# Patient Record
Sex: Female | Born: 1965
Health system: Southern US, Community
[De-identification: ages and names within clinical notes are randomized; demographics above are authoritative.]

## PROBLEM LIST (undated history)

## (undated) DIAGNOSIS — K219 Gastro-esophageal reflux disease without esophagitis: Secondary | ICD-10-CM

## (undated) DIAGNOSIS — Z8489 Family history of other specified conditions: Secondary | ICD-10-CM

## (undated) DIAGNOSIS — E1169 Type 2 diabetes mellitus with other specified complication: Secondary | ICD-10-CM

## (undated) DIAGNOSIS — E785 Hyperlipidemia, unspecified: Secondary | ICD-10-CM

## (undated) DIAGNOSIS — I10 Essential (primary) hypertension: Secondary | ICD-10-CM

## (undated) DIAGNOSIS — R7303 Prediabetes: Secondary | ICD-10-CM

## (undated) DIAGNOSIS — E669 Obesity, unspecified: Secondary | ICD-10-CM

## (undated) DIAGNOSIS — D126 Benign neoplasm of colon, unspecified: Secondary | ICD-10-CM

## (undated) DIAGNOSIS — E7800 Pure hypercholesterolemia, unspecified: Secondary | ICD-10-CM

## (undated) DIAGNOSIS — Z973 Presence of spectacles and contact lenses: Secondary | ICD-10-CM

## (undated) HISTORY — DX: Type 2 diabetes mellitus with other specified complication: E66.9

## (undated) HISTORY — DX: Obesity, unspecified: E11.69

## (undated) HISTORY — DX: Benign neoplasm of colon, unspecified: D12.6

## (undated) HISTORY — DX: Essential (primary) hypertension: I10

## (undated) HISTORY — DX: Prediabetes: R73.03

## (undated) HISTORY — PX: CHOLECYSTECTOMY: SHX55

## (undated) HISTORY — DX: Hyperlipidemia, unspecified: E78.5

---

## 1998-02-12 ENCOUNTER — Ambulatory Visit (HOSPITAL_BASED_OUTPATIENT_CLINIC_OR_DEPARTMENT_OTHER): Admission: RE | Admit: 1998-02-12 | Discharge: 1998-02-12 | Payer: Self-pay | Admitting: Surgery

## 1999-03-25 ENCOUNTER — Other Ambulatory Visit: Admission: RE | Admit: 1999-03-25 | Discharge: 1999-03-25 | Payer: Self-pay | Admitting: Obstetrics and Gynecology

## 1999-09-07 ENCOUNTER — Emergency Department (HOSPITAL_COMMUNITY): Admission: EM | Admit: 1999-09-07 | Discharge: 1999-09-07 | Payer: Self-pay | Admitting: Emergency Medicine

## 1999-09-07 ENCOUNTER — Encounter: Payer: Self-pay | Admitting: Emergency Medicine

## 1999-11-15 ENCOUNTER — Encounter: Payer: Self-pay | Admitting: Obstetrics and Gynecology

## 1999-11-15 ENCOUNTER — Ambulatory Visit (HOSPITAL_COMMUNITY): Admission: RE | Admit: 1999-11-15 | Discharge: 1999-11-15 | Payer: Self-pay | Admitting: Obstetrics and Gynecology

## 2000-06-04 ENCOUNTER — Encounter: Payer: Self-pay | Admitting: *Deleted

## 2000-06-04 ENCOUNTER — Emergency Department (HOSPITAL_COMMUNITY): Admission: EM | Admit: 2000-06-04 | Discharge: 2000-06-04 | Payer: Self-pay | Admitting: Emergency Medicine

## 2000-06-04 ENCOUNTER — Encounter: Payer: Self-pay | Admitting: Gastroenterology

## 2001-04-16 ENCOUNTER — Other Ambulatory Visit: Admission: RE | Admit: 2001-04-16 | Discharge: 2001-04-16 | Payer: Self-pay | Admitting: Obstetrics and Gynecology

## 2002-03-15 ENCOUNTER — Ambulatory Visit (HOSPITAL_BASED_OUTPATIENT_CLINIC_OR_DEPARTMENT_OTHER): Admission: RE | Admit: 2002-03-15 | Discharge: 2002-03-15 | Payer: Self-pay | Admitting: Surgery

## 2002-03-15 ENCOUNTER — Encounter (INDEPENDENT_AMBULATORY_CARE_PROVIDER_SITE_OTHER): Payer: Self-pay | Admitting: *Deleted

## 2002-08-07 ENCOUNTER — Other Ambulatory Visit: Admission: RE | Admit: 2002-08-07 | Discharge: 2002-08-07 | Payer: Self-pay | Admitting: Obstetrics and Gynecology

## 2003-04-16 ENCOUNTER — Encounter: Admission: RE | Admit: 2003-04-16 | Discharge: 2003-04-16 | Payer: Self-pay | Admitting: Internal Medicine

## 2003-04-16 ENCOUNTER — Encounter: Payer: Self-pay | Admitting: Internal Medicine

## 2003-08-20 ENCOUNTER — Other Ambulatory Visit: Admission: RE | Admit: 2003-08-20 | Discharge: 2003-08-20 | Payer: Self-pay | Admitting: Obstetrics and Gynecology

## 2003-09-29 ENCOUNTER — Emergency Department (HOSPITAL_COMMUNITY): Admission: EM | Admit: 2003-09-29 | Discharge: 2003-09-29 | Payer: Self-pay

## 2004-03-16 ENCOUNTER — Encounter: Admission: RE | Admit: 2004-03-16 | Discharge: 2004-03-16 | Payer: Self-pay | Admitting: Obstetrics and Gynecology

## 2004-10-13 ENCOUNTER — Encounter: Admission: RE | Admit: 2004-10-13 | Discharge: 2004-10-13 | Payer: Self-pay | Admitting: Family Medicine

## 2004-12-14 ENCOUNTER — Encounter: Admission: RE | Admit: 2004-12-14 | Discharge: 2004-12-14 | Payer: Self-pay | Admitting: Family Medicine

## 2005-05-11 ENCOUNTER — Encounter: Admission: RE | Admit: 2005-05-11 | Discharge: 2005-05-11 | Payer: Self-pay | Admitting: Internal Medicine

## 2006-03-17 ENCOUNTER — Encounter: Admission: RE | Admit: 2006-03-17 | Discharge: 2006-03-17 | Payer: Self-pay | Admitting: Internal Medicine

## 2006-10-12 ENCOUNTER — Other Ambulatory Visit: Admission: RE | Admit: 2006-10-12 | Discharge: 2006-10-12 | Payer: Self-pay | Admitting: Obstetrics and Gynecology

## 2006-10-17 ENCOUNTER — Encounter: Admission: RE | Admit: 2006-10-17 | Discharge: 2006-10-17 | Payer: Self-pay | Admitting: Obstetrics and Gynecology

## 2006-10-18 ENCOUNTER — Encounter: Admission: RE | Admit: 2006-10-18 | Discharge: 2006-10-18 | Payer: Self-pay | Admitting: Obstetrics and Gynecology

## 2007-01-26 ENCOUNTER — Encounter: Admission: RE | Admit: 2007-01-26 | Discharge: 2007-01-26 | Payer: Self-pay | Admitting: *Deleted

## 2007-05-30 ENCOUNTER — Encounter: Admission: RE | Admit: 2007-05-30 | Discharge: 2007-05-30 | Payer: Self-pay | Admitting: *Deleted

## 2007-07-04 ENCOUNTER — Observation Stay (HOSPITAL_COMMUNITY): Admission: EM | Admit: 2007-07-04 | Discharge: 2007-07-05 | Payer: Self-pay | Admitting: *Deleted

## 2007-10-17 ENCOUNTER — Emergency Department (HOSPITAL_COMMUNITY): Admission: EM | Admit: 2007-10-17 | Discharge: 2007-10-17 | Payer: Self-pay | Admitting: Emergency Medicine

## 2009-02-12 ENCOUNTER — Telehealth: Payer: Self-pay | Admitting: Gastroenterology

## 2009-02-12 ENCOUNTER — Ambulatory Visit (HOSPITAL_COMMUNITY): Admission: RE | Admit: 2009-02-12 | Discharge: 2009-02-12 | Payer: Self-pay | Admitting: Gastroenterology

## 2009-02-12 ENCOUNTER — Ambulatory Visit: Payer: Self-pay | Admitting: Gastroenterology

## 2009-02-12 ENCOUNTER — Encounter: Payer: Self-pay | Admitting: Nurse Practitioner

## 2009-02-12 DIAGNOSIS — K219 Gastro-esophageal reflux disease without esophagitis: Secondary | ICD-10-CM | POA: Insufficient documentation

## 2009-02-12 DIAGNOSIS — R112 Nausea with vomiting, unspecified: Secondary | ICD-10-CM | POA: Insufficient documentation

## 2009-02-12 DIAGNOSIS — R1013 Epigastric pain: Secondary | ICD-10-CM | POA: Insufficient documentation

## 2009-02-12 LAB — CONVERTED CEMR LAB
Basophils Absolute: 0.1 10*3/uL (ref 0.0–0.1)
Bilirubin, Direct: 0.1 mg/dL (ref 0.0–0.3)
Eosinophils Absolute: 0.1 10*3/uL (ref 0.0–0.7)
Lipase: 8 units/L — ABNORMAL LOW (ref 11.0–59.0)
MCHC: 34.8 g/dL (ref 30.0–36.0)
MCV: 93.7 fL (ref 78.0–100.0)
Monocytes Absolute: 0.2 10*3/uL (ref 0.1–1.0)
Neutrophils Relative %: 60.8 % (ref 43.0–77.0)
Platelets: 287 10*3/uL (ref 150.0–400.0)
Total Bilirubin: 0.5 mg/dL (ref 0.3–1.2)

## 2009-02-13 ENCOUNTER — Ambulatory Visit: Payer: Self-pay | Admitting: Gastroenterology

## 2009-02-13 ENCOUNTER — Telehealth: Payer: Self-pay | Admitting: Gastroenterology

## 2009-02-13 ENCOUNTER — Ambulatory Visit (HOSPITAL_COMMUNITY): Admission: RE | Admit: 2009-02-13 | Discharge: 2009-02-13 | Payer: Self-pay | Admitting: Gastroenterology

## 2009-02-16 ENCOUNTER — Telehealth: Payer: Self-pay | Admitting: Gastroenterology

## 2009-02-24 ENCOUNTER — Ambulatory Visit: Payer: Self-pay | Admitting: Gastroenterology

## 2009-02-24 DIAGNOSIS — K222 Esophageal obstruction: Secondary | ICD-10-CM | POA: Insufficient documentation

## 2009-03-11 ENCOUNTER — Encounter: Payer: Self-pay | Admitting: Gastroenterology

## 2009-03-11 ENCOUNTER — Ambulatory Visit (HOSPITAL_COMMUNITY): Admission: RE | Admit: 2009-03-11 | Discharge: 2009-03-11 | Payer: Self-pay | Admitting: Gastroenterology

## 2009-07-17 ENCOUNTER — Encounter: Payer: Self-pay | Admitting: Emergency Medicine

## 2009-07-17 ENCOUNTER — Ambulatory Visit (HOSPITAL_COMMUNITY): Admission: AD | Admit: 2009-07-17 | Discharge: 2009-07-17 | Payer: Self-pay | Admitting: Orthopaedic Surgery

## 2009-10-23 ENCOUNTER — Encounter: Payer: Self-pay | Admitting: Gastroenterology

## 2009-10-28 ENCOUNTER — Telehealth: Payer: Self-pay | Admitting: Gastroenterology

## 2009-10-29 ENCOUNTER — Ambulatory Visit (HOSPITAL_COMMUNITY): Admission: RE | Admit: 2009-10-29 | Discharge: 2009-10-29 | Payer: Self-pay | Admitting: Gastroenterology

## 2009-10-29 ENCOUNTER — Ambulatory Visit: Payer: Self-pay | Admitting: Gastroenterology

## 2009-10-29 LAB — CONVERTED CEMR LAB
Alkaline Phosphatase: 67 units/L (ref 39–117)
Amylase: 41 units/L (ref 27–131)
Bilirubin, Direct: 0.1 mg/dL (ref 0.0–0.3)
Eosinophils Relative: 0.7 % (ref 0.0–5.0)
HCT: 39.4 % (ref 36.0–46.0)
Hemoglobin: 13.2 g/dL (ref 12.0–15.0)
Lymphs Abs: 1.7 10*3/uL (ref 0.7–4.0)
Monocytes Relative: 6.9 % (ref 3.0–12.0)
Neutro Abs: 3.1 10*3/uL (ref 1.4–7.7)
Platelets: 251 10*3/uL (ref 150.0–400.0)
RBC: 4.24 M/uL (ref 3.87–5.11)
Total Protein: 7.5 g/dL (ref 6.0–8.3)
WBC: 5.2 10*3/uL (ref 4.5–10.5)

## 2009-10-30 ENCOUNTER — Ambulatory Visit (HOSPITAL_COMMUNITY): Admission: RE | Admit: 2009-10-30 | Discharge: 2009-10-30 | Payer: Self-pay | Admitting: Gastroenterology

## 2009-10-30 ENCOUNTER — Telehealth: Payer: Self-pay | Admitting: Gastroenterology

## 2009-10-30 ENCOUNTER — Ambulatory Visit: Payer: Self-pay | Admitting: Gastroenterology

## 2009-10-30 ENCOUNTER — Encounter (INDEPENDENT_AMBULATORY_CARE_PROVIDER_SITE_OTHER): Payer: Self-pay | Admitting: *Deleted

## 2009-11-03 ENCOUNTER — Ambulatory Visit (HOSPITAL_COMMUNITY): Admission: RE | Admit: 2009-11-03 | Discharge: 2009-11-03 | Payer: Self-pay | Admitting: General Surgery

## 2009-11-18 ENCOUNTER — Encounter: Payer: Self-pay | Admitting: Gastroenterology

## 2010-09-12 ENCOUNTER — Encounter: Payer: Self-pay | Admitting: Family Medicine

## 2010-09-21 NOTE — Progress Notes (Signed)
Summary: CONDITION UPDATE  ---- Converted from flag ---- ---- 10/30/2009 9:01 AM, Louis Meckel MD wrote: please call pt to see if she still has pain ------------------------------  Phone Note Outgoing Call   Call placed by: Laureen Ochs LPN,  October 30, 2009 9:05 AM Call placed to: Patient Summary of Call: HIDA Scan is normal. She states pain remains the same, vomits after every meal and states she is very tender to touch in mid. epigastric and RUQ area.  Initial call taken by: Laureen Ochs LPN,  October 30, 2009 9:08 AM  Follow-up for Phone Call        schedule EGD for noon today at the hospital Follow-up by: Louis Meckel MD,  October 30, 2009 9:58 AM  Additional Follow-up for Phone Call Additional follow up Details #1::        Pt. is scheduled for an Endo. at [facility] today at 12:30pm. All instructions reviewed with pt. by phone.  Additional Follow-up by: Laureen Ochs LPN,  October 30, 2009 10:30 AM

## 2010-09-21 NOTE — Letter (Signed)
Summary: WORK NOTE  [facility]  [address] [place], [place] [address]   Phone: [phone]  Fax: [phone]    10/29/2009  TO: WHOM IT MAY CONCERN  RE: AZAYLA POLO [address] [id number]       The above named individual is currently under my care and will be out of work    FROM: 10/29/2009   THROUGH:10/29/2009    REASON:    MAY RETURN ON: pt was seen in our office today  10/29/2009     If you have any further questions or need additional information, please call.     Sincerely,  Ameliana Brashear,MD    typed by: Merri Ray CMA (AAMA)

## 2010-09-21 NOTE — Assessment & Plan Note (Signed)
Summary: EPIGASTRIC PAIN, N/V            Stephanie [name]   History of Present Illness Visit Type: Follow-up Visit Primary GI MD: Melvia Heaps MD [facility] Primary Provider: Alinda Deem, MD Requesting Provider: n/a Chief Complaint: Epigastric pain History of Present Illness:   Stephanie [name] has returned for evaluation of abdominal pain, nausea and vomiting.  For the past 2 days she has been complaining of moderately severe sharp upper epigastric and right upper quadrant pain that radiates to her back.  It is accompanied by postprandial nausea and vomiting.  She is without fever, myalgias or arthralgias.  She has had several loose stools as well.  These symptoms are new.  In the last year she had an ultrasound and HIDA scan which were unremarkable.   GI Review of Systems    Reports abdominal pain, nausea, and  vomiting.     Location of  Abdominal pain: RUQ.    Denies acid reflux, belching, bloating, chest pain, dysphagia with liquids, dysphagia with solids, heartburn, loss of appetite, vomiting blood, weight loss, and  weight gain.      Reports diarrhea.      Current Medications (verified): 1)  Lisinopril 20 Mg Tabs (Lisinopril) .... One Tablet By Mouth Once Daily 2)  Pravastatin Sodium 40 Mg Tabs (Pravastatin Sodium) .... One Tablet By Mouth Once Daily 3)  Prevacid 30 Mg Cpdr (Lansoprazole) .... Take 2 Tab Daily  Allergies (verified): No Known Drug Allergies  Past History:  Past Medical History: Reviewed history from 02/12/2009 and no changes required. Hyperlipidemia Hypertension GERD  Past Surgical History: Reviewed history from 02/12/2009 and no changes required. C-section   Family History: Reviewed history from 02/12/2009 and no changes required. No FH of Colon Cancer: Brain Cancer: Maternal Grandmother Family History of Heart Disease: Dad--MI  Social History: Reviewed history from 02/12/2009 and no changes required. Occupation: [employer] Married 1  boy Patient has never smoked.  Alcohol Use - no Daily Caffeine Use: 2 cups daily Illicit Drug Use - no Patient does not get regular exercise.   Review of Systems       The patient complains of headaches-new and night sweats.  The patient denies allergy/sinus, anemia, anxiety-new, arthritis/joint pain, back pain, blood in urine, breast changes/lumps, change in vision, confusion, cough, coughing up blood, depression-new, fainting, fatigue, fever, hearing problems, heart murmur, heart rhythm changes, itching, menstrual pain, muscle pains/cramps, nosebleeds, pregnancy symptoms, shortness of breath, skin rash, sleeping problems, sore throat, swelling of feet/legs, swollen lymph glands, thirst - excessive , urination - excessive , urination changes/pain, urine leakage, vision changes, and voice change.    Vital Signs:  Patient profile:   45 year old female Height:      62 inches Weight:      161.13 pounds BMI:     29.58 Pulse rate:   92 / minute Pulse rhythm:   regular BP sitting:   98 / 66  (left arm) Cuff size:   regular  Vitals Entered By: June McMurray CMA Duncan Dull) (October 29, 2009 8:33 AM)  Physical Exam  Additional Exam:  On physical exam she is an ill-appearing female  skin: anicteric HEENT: normocephalic; PEERLA; no nasal or pharyngeal abnormalities neck: supple nodes: no cervical lymphadenopathy chest: clear to ausculatation and percussion heart: no murmurs, gallops, or rubs abd: soft, nontender; BS normoactive; no abdominal masses,  organomegaly; there is moderate tenderness in the right upper quadrant and upper midepigastric area without guarding or rebound rectal: deferred ext:  no cynanosis, clubbing, edema skeletal: no deformities neuro: oriented x 3; no focal abnormalities    Impression & Recommendations:  Problem # 1:  ABDOMINAL PAIN-EPIGASTRIC (ICD-789.06) Assessment Deteriorated  This appears to be an acute problem and  is different from her chronic complaints  of pain.  Acute cholecystitis is a concern.  Gastroenteritis it is another possibility.  It is noteworthy that she has had negative studies in the past but this appears to be a new problem.  Recommendations #1 check CBC, amylase, LFTs #2 stat HIDA scan  Orders: TLB-CBC Platelet - w/Differential (85025-CBCD) TLB-Amylase (82150-AMYL) TLB-Lipase (83690-LIPASE) TLB-Hepatic/Liver Function Pnl (80076-HEPATIC) HIDA Scan (HIDA SCAN)  Patient Instructions: 1)  CC Dr. Alinda Deem 2)  We have printed your prescriptions for you 3)  Do not take pain meds until after your HIDA scan 4)  Your HIDA scan is today at [facility] at 2pm 5)  The medication list was reviewed and reconciled.  All changed / newly prescribed medications were explained.  A complete medication list was provided to the patient / caregiver. Prescriptions: PROMETHAZINE HCL 25 MG TABS (PROMETHAZINE HCL) take one tab q.6 h. p.r.n. nausea  #25 x 0   Entered and Authorized by:   Louis Meckel MD   Signed by:   Louis Meckel MD on 10/29/2009   Method used:   Print then Give to Patient   RxID:   [id number] PERCOCET 5-325 MG TABS (OXYCODONE-ACETAMINOPHEN) take one to 2 tabs q.6 h. p.r.n.  #25 x 0   Entered and Authorized by:   Louis Meckel MD   Signed by:   Louis Meckel MD on 10/29/2009   Method used:   Print then Give to Patient   RxID:   [id number]

## 2010-09-21 NOTE — Letter (Signed)
Summary: Out of Work  [facility]  [address] [place], [place] [address]   Phone: [phone]  Fax: [phone]    October 30, 2009   Employee:  Stephanie [name]    To Whom It May Concern:   For Medical reasons, please excuse the above named employee from work for the following dates:  Start:   10-30-09  End:   11-01-09.  Pt. may return to work on 11-02-09.  If you need additional information, please feel free to contact our office.         Sincerely,        Dr.Robert Kaplan,MD    Laureen Ochs LPN  Appended Document: Out of Work Faxed to pt. at [phone].

## 2010-09-21 NOTE — Progress Notes (Signed)
Summary: Triage-pain,n/v  Phone Note Call from Patient Call back at [phone] Cell/[phone] WK 1202   Caller: Patient Call For: Dr. Arlyce Dice Reason for Call: Talk to Nurse Summary of Call: Pt is having upper abdominal pain and even "sore to touch". Right side/cramp pain Initial call taken by: Karna Christmas,  October 28, 2009 3:47 PM  Follow-up for Phone Call        Pt. c/o an episode of abd. pain after a late meal last night. She wkoe up at 2am with pain in epigastric/RUQ/back pain. She continues with pain today and is sore to touch. Had n/v of food she ate last night, about 9am this morning. Has been on liquids the rest of today. Vomited again at 12:30pm today. Continues w/pain.   1) See Dr.Shawntelle Ungar on 10-29-09 at 9:15am 2) If symptoms become worse call back immediately or go to ER.  Follow-up by: Laureen Ochs LPN,  October 28, 2009 5:27 PM

## 2010-09-21 NOTE — Consult Note (Signed)
Summary: [facility]   Imported By: Sherian Rein 11/13/2009 14:44:15  _____________________________________________________________________  External Attachment:    Type:   Image     Comment:   External Document

## 2010-09-21 NOTE — Procedures (Signed)
Summary: Upper Endoscopy  Patient: [name] Note: All result statuses are Final unless otherwise noted.  Tests: (1) Upper Endoscopy (EGD)   EGD Upper Endoscopy       DONE     [facility]     [address] [place], [place]  [address]           ENDOSCOPY PROCEDURE REPORT           PATIENT:  Stephanie [name], Stephanie [name]  MR#:  [id number]     BIRTHDATE:  1966/07/23, 43 yrs. old  GENDER:  female           ENDOSCOPIST:  Barbette Hair. Arlyce Dice, MD     Referred by:           PROCEDURE DATE:  10/30/2009     PROCEDURE:  EGD, diagnostic     ASA CLASS:  Class I     INDICATIONS:  abdominal pain           MEDICATIONS:   Fentanyl 75 mcg IV, Versed 7.5 mg, Benadryl 25 mg,     glycopyrrolate (Robinal) 0.2 mg IV     TOPICAL ANESTHETIC:  Cetacaine Spray           DESCRIPTION OF PROCEDURE:   After the risks benefits and     alternatives of the procedure were thoroughly explained, informed     consent was obtained.  The EG-[id number] ([id number]) endoscope was     introduced through the mouth and advanced to the third portion of     the duodenum, without limitations.  The instrument was slowly     withdrawn as the mucosa was fully examined.     <<PROCEDUREIMAGES>>           The upper, middle, and distal third of the esophagus were     carefully inspected and no abnormalities were noted. The z-line     was well seen at the GEJ. The endoscope was pushed into the fundus     which was normal including a retroflexed view. The antrum,gastric     body, first and second part of the duodenum were unremarkable (see     image001, image002, image003, image004, image005, and image007).     Retroflexed views revealed no abnormalities.    The scope was then     withdrawn from the patient and the procedure completed.           COMPLICATIONS:  None           ENDOSCOPIC IMPRESSION:     1) Normal EGD     RECOMMENDATIONS:Surgical consultation           REPEAT EXAM:  No           ______________________________     Barbette Hair. Arlyce Dice, MD           CC:           n.     eSIGNED:   Barbette Hair. Romelia Bromell at 10/30/2009 01:05 PM           Randal Buba, [id number]  Note: An exclamation mark (!) indicates a result that was not dispersed into the flowsheet. Document Creation Date: 10/30/2009 1:06 PM _______________________________________________________________________  (1) Order result status: Final Collection or observation date-time: 10/30/2009 13:01 Requested date-time:  Receipt date-time:  Reported date-time:  Referring Physician:   Ordering Physician: Melvia Heaps [phone]) Specimen Source:  Source: Launa Grill Order Number: [id number] Lab site:

## 2010-09-21 NOTE — Progress Notes (Signed)
Summary: Surgeon Appt.  Phone Note Other Incoming   Caller: DR.Evangelos Paulino Summary of Call: Pt. is in [facility] Endo. recovery, Dr.Oliviana Mcgahee wants pt. to see a surgeon today. Pt. will see Dr.Wilson at [facility] at 4pm today. I have given appt. info. to the recovery nurse in [facility] Endo. All records have been faxed to Dr.Wilson. Initial call taken by: Laureen Ochs LPN,  October 30, 2009 1:33 PM

## 2010-09-21 NOTE — Letter (Signed)
Summary: [facility]   Imported By: Sherian Rein 12/22/2009 07:21:05  _____________________________________________________________________  External Attachment:    Type:   Image     Comment:   External Document

## 2010-10-12 ENCOUNTER — Ambulatory Visit (HOSPITAL_COMMUNITY)
Admission: RE | Admit: 2010-10-12 | Discharge: 2010-10-12 | Disposition: A | Discharge status: Home / Self Care | Payer: Self-pay | Source: Ambulatory Visit | Attending: Internal Medicine | Admitting: Internal Medicine

## 2010-10-12 ENCOUNTER — Other Ambulatory Visit: Payer: Self-pay | Admitting: Internal Medicine

## 2010-10-12 ENCOUNTER — Other Ambulatory Visit (HOSPITAL_COMMUNITY): Payer: Self-pay

## 2010-10-12 DIAGNOSIS — K7689 Other specified diseases of liver: Secondary | ICD-10-CM | POA: Insufficient documentation

## 2010-10-12 DIAGNOSIS — K37 Unspecified appendicitis: Secondary | ICD-10-CM

## 2010-10-12 DIAGNOSIS — I700 Atherosclerosis of aorta: Secondary | ICD-10-CM | POA: Insufficient documentation

## 2010-10-12 DIAGNOSIS — R1031 Right lower quadrant pain: Secondary | ICD-10-CM | POA: Insufficient documentation

## 2010-10-12 DIAGNOSIS — K3580 Unspecified acute appendicitis: Secondary | ICD-10-CM

## 2010-10-13 ENCOUNTER — Encounter (HOSPITAL_COMMUNITY): Payer: Self-pay

## 2010-10-13 MED ORDER — IOHEXOL 300 MG/ML  SOLN
100.0000 mL | Freq: Once | INTRAMUSCULAR | Status: AC | PRN
  Administered 2010-10-13: 100 mL via INTRAVENOUS

## 2010-11-15 LAB — COMPREHENSIVE METABOLIC PANEL
ALT: 29 U/L (ref 0–35)
Alkaline Phosphatase: 70 U/L (ref 39–117)
CO2: 28 mEq/L (ref 19–32)
Calcium: 9 mg/dL (ref 8.4–10.5)
GFR calc non Af Amer: >60 mL/min (ref >60)
Glucose, Bld: 102 mg/dL — ABNORMAL HIGH (ref 70–99)
Potassium: 4.3 mEq/L (ref 3.5–5.1)
Sodium: 140 mEq/L (ref 135–145)
Total Bilirubin: 0.6 mg/dL (ref 0.3–1.2)

## 2011-01-04 NOTE — Discharge Summary (Signed)
Stephanie [name], [name]              ACCOUNT NO.:  [id number]   MEDICAL RECORD NO.:  [id number]          PATIENT TYPE:  INP   LOCATION:  3729                         FACILITY:  [facility]   PHYSICIAN:  Stephanie [name], MDDATE OF BIRTH:  04-26-1966   DATE OF ADMISSION:  07/04/2007  DATE OF DISCHARGE:  07/05/2007                               DISCHARGE SUMMARY   DISCHARGE DIAGNOSES:  1. Atypical chest pain.  2. Palpitations.  3. Sinus tachycardia.  4. Hyperlipidemia.  5. Obesity.  6. Hypertension.  7. Hyperkalemia.   CONSULTATIONS:  Cardiology, Dr. Anne [name].   STUDIES:  Cardiac enzymes negative x3.  EKG without evidence of acute  MI.  Chest x-ray, no active cardiopulmonary disease, this was done on  July 04, 2007.   HISTORY AND PHYSICAL:  Please see prior H&P for complete history, but  shortly this is a 45 year old female with history of hypertension and  hyperlipidemia and a family history of early heart disease in her  mother, who presented with chest pain and palpitations.  The patient was  noted to be somewhat tachycardic on admission.  Cardiac enzymes x3 and  EKG did not show evidence of acute MI.  Nevertheless, the patient was  observed in the office by [facility].  Cardiology consult was  called the next morning given her risk factors to see if patient needed  in-house stress test per cardiology, though the patient does have some  risk factors, she would benefit from an outpatient stress test and  followup with Dr. Anne [name] given atypical quality of her chest pain.   1. Hyperlipidemia.  LDL was checked, here it was slightly elevated,      118.  Also her triglycerides were above 200.  Given her risk      factors, we will increase her Crestor to 20 mg p.o. daily and we      will start the patient on fish oil.  We will continue her aspirin,      continue Benicar and hydrochlorothiazide.  2. Hypertension.  Continue Benicar and hydrochlorothiazide.  3. Hypokalemia.   Her potassium was replaced IV and p.o.  We will      discharge the patient on 10 mEq p.o. daily and have her      electrolytes rechecked during her next visit with Dr. Lynelle [name].   FOLLOWUP:  The patient to be seen by Dr. Anne [name] on July 16, 2007,  where she will have a stress test, as well as 2D echo.  The patient also  to follow up with Dr. Lynelle [name] in 1 week.  The patient is to call for an  appointment.   DISCHARGE MEDICATIONS:  1. Benicar/hydrochlorothiazide 20 mg continue home dose.  2. Aspirin 81 mg p.o. daily.  3. Crestor increased to 20 mg p.o. daily.  4. Fish oil.  5. Potassium chloride 10 mEq p.o. daily.      Stephanie Cowboy, MD  Electronically Signed     AVD/MEDQ  D:  07/05/2007  T:  07/05/2007  Job:  [id number]   cc:   Stephanie [name], M.D.  Stephanie Bathe, MD

## 2011-01-04 NOTE — H&P (Signed)
Stephanie, [name]              ACCOUNT NO.:  [id number]   MEDICAL RECORD NO.:  [id number]          PATIENT TYPE:  EMS   LOCATION:  MAJO                         FACILITY:  [facility]   PHYSICIAN:  Corinna L. Lendell Caprice, MDDATE OF BIRTH:  Oct 15, 1965   DATE OF ADMISSION:  07/04/2007  DATE OF DISCHARGE:                              HISTORY & PHYSICAL   CHIEF COMPLAINT:  Chest pain and heart fluttering.   HISTORY OF PRESENT ILLNESS:  Ms. Stephanie [name] is a pleasant 45 year old patient  of Dr. Particia Jasper with a history of hypertension and hyperlipidemia, who  felt palpitations yesterday for several minutes.  She also felt some  chest pressure.  Today while she was at work with [employer], she began having chest pain.  She was evaluated by Dr.  Darrick Penna in the office and was given sublingual nitroglycerin, which  alleviated the pain.  She had a few more episodes of sharp pain after  that, however.  She reports that last night after the chest fluttering,  she didn't feel well.  She denies any drug use.  Her father had a  heart attack at age 3.   Per computer records, there is a report of a negative stress test in  2001, but there is no actual stress test in the computer.  Patient does  not recall where this was done.  Currently, she has no chest pain.   She received aspirin, sublingual nitroglycerin, and potassium in the  emergency room.   PAST MEDICAL HISTORY:  1. Hypertension.  2. Hyperlipidemia.  3. Gastroesophageal reflux disease.   MEDICATIONS:  1. Benicar/HCT dose unknown.  2. Crestor 10 mg a day.   SOCIAL HISTORY:  Patient works at the [employer] and exercise at [employer].  She does not smoke, drink, or use drugs.   FAMILY HISTORY:  Father had a heart attack at age 69.  There is also  hypertension and hyperlipidemia which runs in her family.   PAST SURGICAL HISTORY:  She has had a benign cyst removed in her leg.  C-  section.   REVIEW OF SYSTEMS:   Reviewed and as above, otherwise negative.   PHYSICAL EXAMINATION:  Temperature 97.5, blood pressure 120/78, pulse  109, respiratory rate 24, oxygen saturation 96% on room air.  GENERAL:  Patient is an overweight white female in no acute distress.  HEENT:  Normocephalic and atraumatic.  Pupils are equal, round and  reactive to light.  Sclerae are anicteric.  Moist mucous membranes.  NECK:  Supple.  No carotid bruits.  No thyromegaly.  No JVD.  No  lymphadenopathy.  LUNGS:  Clear to auscultation bilaterally without wheezes, rales or  rhonchi.  CARDIOVASCULAR:  Regular rate and rhythm without murmurs, rubs or  gallops.  ABDOMEN:  Normal bowel sounds.  Soft, nontender, nondistended.  CHEST:  She does have some chest wall tenderness which does not  reproduce her earlier chest pain.  GU/RECTAL:  Deferred.  EXTREMITIES:  No clubbing, cyanosis or edema.  No calf tenderness.  Denna Haggard' sign negative.  SKIN:  No rash.  PSYCHIATRIC:  Normal affect.  NEUROLOGIC:  Alert and oriented.  Cranial nerves and sensory and motor  exam intact.   LABS:  Hemoglobin and hematocrit normal.  Point-of-care enzymes  negative.  D-dimer less than 0.22.  Basic metabolic panel significant  for a potassium of 3.2.  Urine pregnancy negative.  UA negative.   EKG shows sinus tachycardia with a rate of 113.   Chest x-ray negative.   ASSESSMENT/PLAN:  1. Chest pain and palpitations:  The patient will be placed on 23-hour      observation, get serial cardiac enzymes.  Will check a TSH.      Continue aspirin.  Consult cardiology in the morning if      appropriate.  She will be n.p.o. after midnight.  2. Hypertension:  Continue Benicar.  3. Hyperlipidemia:  Continue Crestor and check liver function tests.  4. Gastroesophageal reflux disease:  Patient reports that this did not      feel like her usual reflux symptoms.      Corinna L. Lendell Caprice, MD  Electronically Signed     CLS/MEDQ  D:  07/04/2007  T:   07/04/2007  Job:  [id number]   cc:   Dr. Bess Kinds

## 2011-01-04 NOTE — Consult Note (Signed)
Stephanie [name], Stephanie [name]              ACCOUNT NO.:  [id number]   MEDICAL RECORD NO.:  [id number]          PATIENT TYPE:  INP   LOCATION:  3729                         FACILITY:  [facility]   PHYSICIAN:  Jake Bathe, MD      DATE OF BIRTH:  07-04-1966   DATE OF CONSULTATION:  07/05/2007  DATE OF DISCHARGE:                                 CONSULTATION   REQUESTING PHYSICIAN:  Stephanie [name], M.D.   REASON FOR CONSULTATION:  Stephanie [name] is being seen at the request of Dr.  Adela [name] for the evaluation of chest pain and palpitations.  Primary care  physician Dr. Particia [name].   HISTORY OF PRESENT ILLNESS:  This is a 45 year old female with  hypertension, hyperlipidemia, early family history of coronary artery  disease with her father having myocardial infarction and death at age  75, who was admitted yesterday with chest pain.  Two days prior, at  work, she felt fluttering and weakness at lunch and did not feel  herself.  This continued throughout the day and yesterday culminated at  1 p.m. with substernal chest pain, which was described as knife like,  but also described as someone sitting on her chest, which lasted  approximately 5 minutes in duration and was relieved with nitroglycerin.  She works at the [employer], and Dr. Darrick Penna gave her  a nitroglycerin.  She was then taken to [facility] emergency for further  evaluation.  Here, overnight at around 10:30 p.m., she had once again  some stabbing chest pain, which lasted less than 5 minutes.  Did not  receive a nitroglycerin and this resolved on its own.  She did not have  any associated shortness of breath, nausea, vomiting, fevers, chills,  syncope, and presyncopy.  She has had occasional tachycardia.  Currently, feels well without complaints.  Her potassium is noted to be  3.2 on arrival and she was supplemented appropriately.   PAST MEDICAL HISTORY:  1. Hypertension - Benicar HCT.  2. Hyperlipidemia - Crestor.  3.  Recent thyroid ultrasound and TSH normal, approximately 4 months      ago.   ALLERGIES:  No known drug allergies.   MEDICATIONS:  1. Benicar/HCT 20/12.5 daily.  2. Crestor 10 mg once a day.   SOCIAL HISTORY:  She works at the [employer], early years/pre.  Denies  any tobacco, alcohol, or over the counter drug use such as Sudafed.  She  is married.  Husband is a Naval architect.   FAMILY HISTORY:  Her father died of myocardial infarction at age 22.  There was no other sudden cardiac death history.   REVIEW OF SYSTEMS:  No skin or hair changes or bowel or bladder changes.  Unless specified above, all of the 12 review of systems negative.   PHYSICAL EXAMINATION:  VITAL SIGNS:  Temperature 97.3, pulse 74 to 84  with brief period of sinus tachycardia noted on telemetry.  Respirations  16 to 20.  Blood pressure 144 to 94 over 50 to 73.  GENERAL:  Alert and oriented x3, no acute distress, pleasant, lying in  bed.  Appears well.  EYES:  Well perfuses conjunctivae.  EOMI.  No scleral icterus.  ENT:  Moist mucous membranes.  NECK:  Thick.  No JVD.  No thyromegaly.  No carotid bruits.  CARDIOVASCULAR:  Regular rate and rhythm with a soft 1-2/6 systolic  murmur heard at apex.  Normal PMI.  CHEST:  Lungs clear to auscultation bilaterally.  Normal respiratory  effort.  No wheezes.  ABDOMEN:  Mildly obese.  Positive bowel sounds.  Nontender.  No bruits.  EXTREMITIES:  No clubbing, cyanosis, or edema.  Normal pulses.  NEUROLOGIC:  Nonfocal.  SKIN:  Warm, dry, and intact.   LABORATORY DATA:  ECG sinus tachycardia with nonspecific ST-T wave  changes.  Heart rate was 115 with normal intervals.  There was no prior  for comparison.  Potassium was 3.2, creatinine 0.7, magnesium was normal  at 2.3.  White count mildly elevated at 11.  Hemoglobin 13.7, hematocrit  38.4, platelets 371, D-dimer was normal.  Pregnancy test normal.  LFTs  normal.  Total cholesterol 188, LDL 118, HDL 35, triglycerides  174.  Chest x-ray personally reviewed was within normal limits.   ASSESSMENT/PLAN:  A 45 year old female with hypertension,  hyperlipidemia, early family history of coronary artery disease,  admitted with chest pain, palpitations, and hypokalemia.  1. Chest pain - Fairly atypical story; however, given her cardiac risk      factors, I do believe that further risk stratification with a      nuclear stress test is warranted, and I will perform this      evaluation as an outpatient.  Other possible etiologies include      musculoskeletal or gastroesophageal reflux disease.  I have told      her to try over the counter Zantac.  2. Palpitations - Certainly low potassium can contribute to heart      irritability.  Overnight on telemetry, there were no adverse      arrhythmias noted.  QT interval was normal.  Benicar HCT may be      causing some low potassium; therefore, I recommended potassium 10      mEq per day.  3. Hypertension - On Benicar HCT, well controlled.  Potassium as      above.  4. Hyperlipidemia - LDL currently 118, on Crestor 10 mg.  Goal LDL for      her would be less than 100, may need to increase or add a second      agent.  5. Hypertriglyceridemia - Given her high triglycerides, recommended      low-starch or low-sugar diet.  In addition, recommended addition of      fish oil to drug regimen.   We will see in close follow up and perform nuclear stress test and we  will also perform echocardiogram with soft murmur heard on physical exam  as well as palpitations to ensure there is no structural abnormality.  I  also told her that if she has any concerning pain or issues to seek  medical attention immediately.      Jake Bathe, MD  Electronically Signed     MCS/MEDQ  D:  07/05/2007  T:  07/05/2007  Job:  [id number]   cc:   Stephanie [name], M.D.  Stephanie Cowboy, MD

## 2011-01-07 NOTE — Consult Note (Signed)
[place]. [facility]  Patient:    Stephanie [name], Stephanie [name]                     MRN: [id number] Proc. Date: 06/04/00 Adm. Date:  [id number] Attending:  Lorre Nick CC:         Venita Lick. Pleas Koch., M.D. [facility]   Consultation Report  DATE OF BIRTH:  March 05, 1956  CHIEF COMPLAINT:  Epigastric and right upper quadrant pain.  HISTORY OF PRESENT ILLNESS:  The patient is a 45 year old white female who developed chest pain and severe reflux about three months ago and was seen by Dr. Catha Gosselin at [facility], when a cardiac workup was initiated.  The patient had a normal stress test and was told that she had hypercholesterolemia on February 4.  The patient saw Dr. Russella Dar thereafter fore epigastric and right upper quadrant pain.  She was scheduled for abdominal ultrasound on June 05, 2000.  After eating chili and some beans last night, the patient developed severe right upper quadrant pain with nausea and radiation of the pain to her back.  She said the pain caused her to double over and was advised to try some clear liquids and some Vicodin that she had at home.  The patient called back stating that her symptoms had not improved. The symptoms have worsened this morning.  Therefore, the patient was advised to come to the emergency room.  The patient denies any fever, chills or rigors, melena or hematochezia.  She denies any genitourinary or cardiorespiratory complaints except for some suprapubic tenderness, as well.  ALLERGIES:  No known drug allergies.  MEDICATIONS:  Prilosec 20 mg p.o. q.d.  PAST MEDICAL HISTORY: 1. Cesarean section in 1991. 2. History of reflux and hiatal hernia. 3. Hypercholesterolemia.  SOCIAL HISTORY:  The patient is married and lives with her husband and son in [place].  She denies the use of tobacco, alcohol or drugs.  FAMILY HISTORY:  Noncontributory.  PHYSICAL EXAMINATION:  GENERAL:  Young white female who  seems to be really anxious, crying easily. No acute distress.  VITAL SIGNS:  Stable with blood pressure 100/76, pulse 76, respiratory rate 16, temperature 97.1.  HEENT:  Normocephalic and atraumatic.  EOMI.  Facial symmetry preserved.  NECK:  Supple.  No JVD, thyromegaly or lymphadenopathy.  CHEST:  Clear to auscultation.  There is no wheezing.  HEART:  No murmurs, rubs, or gallops.  ABDOMEN:  Soft with a well healed C-section scar present.  Epigastric and right upper quadrant tenderness to palpation.  No guarding.  No rebound or ______.  No hepatosplenomegaly.  No masses palpable.  RECTAL:  Deferred.  LABORATORY DATA:  Considering her history and exam, ultrasound was done and showed no evidence of gallstones or acalculous cholecystitis.  The gallbladder wall was normal with no evidence of pericholecystic fluid.  Normal CBC except for a white count that was borderline high at 10,600 (with normal being 4000-10,500), hemoglobin 13 gm/dl with neutrophils down to 72%. CMET was normal.  Amylase 38, lipase 20.  Urinalysis was normal.  Urine pregnancy test was negative, as well.  ASSESSMENT AND PLAN:  Acute abdominal pain in the epigastrium and right upper quadrant with normal exam and ultrasound.  GI cocktail helped in the ER.  The patient is advised to return to Dr. Russella Dar early next week for possible endoscopy.  If this is unrevealing, HIDA scan can be done with ______ injections to rule out biliary dyskinesia.  Avoidance of nonsteroidals and and antireflux measures have been discussed with her in great detail.  The patient has Phenergan and Vicodin at home for symptomatic relief and does not want any more pain medication to be prescribed for her. DD:  06/04/00 TD:  06/05/00 Job: [id number]

## 2011-01-07 NOTE — Op Note (Signed)
[place]  Patient:    EIMAN, MARET Visit Number: [id number] MRN: [id number]          Service Type: DSU Location: [facility] Attending Physician:  Bonnetta Barry Dictated by:   Velora Heckler, M.D. Proc. Date: 03/15/02 Admit Date:  03/15/2002                             Operative Report  PREOPERATIVE DIAGNOSIS:  Soft tissue mass of right lower extremity, recurrent.  POSTOPERATIVE DIAGNOSIS:  Soft tissue mass of right lower extremity, recurrent.  PROCEDURE:  Excision of soft tissue mass of right lower extremity.  SURGEON:  Velora Heckler, M.D.  ANESTHESIA:  Local with intravenous sedation.  PREPARATION:  Betadine.  COMPLICATIONS:  None.  ESTIMATED BLOOD LOSS:  Minimal.  INDICATIONS:  The patient is a 45 year old white female who [facility].  Pathology showed fibrous adipose tissue with nerve fascicles, probably representing neuroma.  The patient did form a hematoma postoperatively.  She has now developed a recurrent soft tissue mass at the site of previous surgery.  It has gradually increased in size and was causing some pain with compression.  The patient desires excision.  DESCRIPTION OF PROCEDURE:  The procedure was done in OR #6 at the [facility].  The patient was brought to the operating room and placed in the supine position on the operating room table.  Following the administration of intravenous sedation, the right lower extremity was prepped and draped in the usual strict aseptic fashion.  After ascertaining that an adequate level of sedation had been obtained, the skin was anesthetized with local anesthetic.  Previous surgical scar on the lateral lower right lower extremity is excised using a #15 blade.  Dissection was carried down to the subcutaneous tissues.  Hemostasis was obtained with the  electrocautery.  What appears to be fibrous tissue is causing the mass effect.  This is excised widely down to the underlying muscle and fascia, and a portion of the fascia is excised with the specimen.  Good hemostasis is obtained.  The skin edges were reapproximated with a running 4-0 Vicryl subcuticular suture.  The wound was washed and dried.  Benzoin and Steri-Strips are applied.  The specimen is submitted to pathology for review.  The patient tolerated the procedure well. Dictated by:   Velora Heckler, M.D. Attending Physician:  Bonnetta Barry DD:  03/15/02 TD:  03/18/02 Job: [id number]

## 2011-05-31 LAB — I-STAT 8, (EC8 V) (CONVERTED LAB)
Acid-Base Excess: 3 — ABNORMAL HIGH
Glucose, Bld: 111 — ABNORMAL HIGH
HCT: 44
Hemoglobin: 15
Potassium: 3.2 — ABNORMAL LOW
Sodium: 137
TCO2: 27

## 2011-05-31 LAB — DIFFERENTIAL
Basophils Absolute: 0.2 — ABNORMAL HIGH
Basophils Relative: 2 — ABNORMAL HIGH
Eosinophils Relative: 1
Monocytes Absolute: 0.3

## 2011-05-31 LAB — LIPID PANEL
Cholesterol: 188
HDL: 35 — ABNORMAL LOW
Triglycerides: 174 — ABNORMAL HIGH

## 2011-05-31 LAB — URINALYSIS, ROUTINE W REFLEX MICROSCOPIC
Glucose, UA: NEGATIVE
Ketones, ur: NEGATIVE
Protein, ur: NEGATIVE

## 2011-05-31 LAB — CBC
HCT: 38.4
Hemoglobin: 13.7
MCHC: 35.5
MCV: 92.4
RDW: 12.5

## 2011-05-31 LAB — POCT I-STAT CREATININE
Creatinine, Ser: 0.7
Operator id: 146091

## 2011-05-31 LAB — CARDIAC PANEL(CRET KIN+CKTOT+MB+TROPI)
Relative Index: 0.7
Total CK: 110
Total CK: 121
Troponin I: 0.01

## 2011-05-31 LAB — TSH: TSH: 3.055

## 2011-05-31 LAB — HEPATIC FUNCTION PANEL
Bilirubin, Direct: 0.2
Indirect Bilirubin: 0.3
Total Bilirubin: 0.5

## 2011-06-07 ENCOUNTER — Other Ambulatory Visit: Payer: Self-pay | Admitting: Family Medicine

## 2011-06-07 DIAGNOSIS — Z1231 Encounter for screening mammogram for malignant neoplasm of breast: Secondary | ICD-10-CM

## 2011-06-14 ENCOUNTER — Ambulatory Visit
Admission: RE | Admit: 2011-06-14 | Discharge: 2011-06-14 | Disposition: A | Discharge status: Home / Self Care | Payer: BC Managed Care – PPO | Source: Ambulatory Visit | Attending: Family Medicine | Admitting: Family Medicine

## 2011-06-14 DIAGNOSIS — Z1231 Encounter for screening mammogram for malignant neoplasm of breast: Secondary | ICD-10-CM

## 2011-10-26 ENCOUNTER — Other Ambulatory Visit: Payer: Self-pay | Admitting: Family Medicine

## 2011-10-26 DIAGNOSIS — M545 Low back pain, unspecified: Secondary | ICD-10-CM

## 2011-10-27 ENCOUNTER — Other Ambulatory Visit: Payer: Self-pay

## 2011-10-27 ENCOUNTER — Ambulatory Visit
Admission: RE | Admit: 2011-10-27 | Discharge: 2011-10-27 | Disposition: A | Discharge status: Home / Self Care | Payer: BC Managed Care – PPO | Source: Ambulatory Visit | Attending: Family Medicine | Admitting: Family Medicine

## 2011-10-27 DIAGNOSIS — M545 Low back pain, unspecified: Secondary | ICD-10-CM

## 2012-06-29 ENCOUNTER — Other Ambulatory Visit: Payer: Self-pay | Admitting: Family Medicine

## 2012-06-29 DIAGNOSIS — Z1231 Encounter for screening mammogram for malignant neoplasm of breast: Secondary | ICD-10-CM

## 2012-08-06 ENCOUNTER — Ambulatory Visit
Admission: RE | Admit: 2012-08-06 | Discharge: 2012-08-06 | Disposition: A | Discharge status: Home / Self Care | Payer: BC Managed Care – PPO | Source: Ambulatory Visit | Attending: Family Medicine | Admitting: Family Medicine

## 2012-08-06 DIAGNOSIS — Z1231 Encounter for screening mammogram for malignant neoplasm of breast: Secondary | ICD-10-CM

## 2012-11-09 ENCOUNTER — Telehealth: Payer: Self-pay | Admitting: Family Medicine

## 2012-11-09 ENCOUNTER — Encounter: Payer: Self-pay | Admitting: Family Medicine

## 2012-11-09 DIAGNOSIS — I10 Essential (primary) hypertension: Secondary | ICD-10-CM

## 2012-11-09 MED ORDER — LOSARTAN POTASSIUM 100 MG PO TABS: 100.0000 mg | ORAL_TABLET | Freq: Every day | ORAL | Status: DC

## 2012-11-09 NOTE — Telephone Encounter (Signed)
This encounter was created in error - please disregard.

## 2012-11-09 NOTE — Telephone Encounter (Signed)
Pt called back.  Left mess that new RX called in.

## 2012-11-09 NOTE — Telephone Encounter (Signed)
D/c lisinopril Losartan 100 qd

## 2012-11-09 NOTE — Telephone Encounter (Signed)
Patient reports Lisinopril still making her cough. Can you RX another medication to replace Lisinopril.  She uses [facility] in [place].  Also stated she can not take Lipitor??

## 2013-01-07 ENCOUNTER — Other Ambulatory Visit: Payer: Self-pay | Admitting: Family Medicine

## 2013-01-07 NOTE — Telephone Encounter (Signed)
Medication refilled per protocol.Patient needs to be seen before any further refills  Left pt mess to call and schedule follow up appt with provider

## 2013-02-05 ENCOUNTER — Encounter: Payer: Self-pay | Admitting: Family Medicine

## 2013-02-05 ENCOUNTER — Ambulatory Visit (INDEPENDENT_AMBULATORY_CARE_PROVIDER_SITE_OTHER): Payer: BC Managed Care – PPO | Admitting: Family Medicine

## 2013-02-05 VITALS — BP 118/72 | HR 72 | Temp 98.0°F | Resp 16 | Ht 62.0 in | Wt 175.0 lb

## 2013-02-05 DIAGNOSIS — E785 Hyperlipidemia, unspecified: Secondary | ICD-10-CM

## 2013-02-05 DIAGNOSIS — E669 Obesity, unspecified: Secondary | ICD-10-CM

## 2013-02-05 DIAGNOSIS — R7309 Other abnormal glucose: Secondary | ICD-10-CM

## 2013-02-05 DIAGNOSIS — I10 Essential (primary) hypertension: Secondary | ICD-10-CM

## 2013-02-05 LAB — COMPLETE METABOLIC PANEL WITH GFR
Albumin: 4.4 g/dL (ref 3.5–5.2)
Alkaline Phosphatase: 80 U/L (ref 39–117)
BUN: 9 mg/dL (ref 6–23)
CO2: 26 mEq/L (ref 19–32)
Calcium: 9.7 mg/dL (ref 8.4–10.5)
GFR, Est African American: >89 mL/min
GFR, Est Non African American: >89 mL/min
Glucose, Bld: 120 mg/dL — ABNORMAL HIGH (ref 70–99)
Potassium: 4.8 mEq/L (ref 3.5–5.3)
Total Protein: 7.2 g/dL (ref 6.0–8.3)

## 2013-02-05 LAB — LIPID PANEL
Cholesterol: 164 mg/dL (ref 0–200)
Total CHOL/HDL Ratio: 3.6 Ratio

## 2013-02-05 MED ORDER — PHENTERMINE HCL 37.5 MG PO CAPS: 37.5000 mg | ORAL_CAPSULE | ORAL | Status: DC

## 2013-02-05 NOTE — Progress Notes (Signed)
Subjective:    Patient ID: Stephanie [name], female    DOB: Jun 29, 1966, 47 y.o.   MRN: [id number]  HPI Patient is here for followup of her hyperlipidemia. Lipid panel obtained in January she was found to have an LDL of 200. She is to start Crestor 20 mg by mouth daily. She denies any myalgia or right upper quadrant pain.  She is here today to recheck her fasting lipid panel. She's also here for followup of her hypertension. She's currently taking losartan 100 mg by mouth daily. She denies any chest pain, shortness of breath, dyspnea on exertion. However over the last 6 months she's gained 6 pounds. She is working out daily on an Engineer, materials. She is also trying to follow a low calorie diet. She's becoming frustrated by the failure to see any weight loss. She is interested in options for weight loss. Past Medical History  Diagnosis Date  . Hyperlipidemia   . Hypertension    Current Outpatient Prescriptions on File Prior to Visit  Medication Sig Dispense Refill  . CRESTOR 20 MG tablet TAKE ONE TABLET BY MOUTH EVERY DAY  30 tablet  0  . losartan (COZAAR) 100 MG tablet Take 1 tablet (100 mg total) by mouth daily.  30 tablet  2   No current facility-administered medications on file prior to visit.   No Known Allergies History   Social History  . Marital Status: Single    Spouse Name: N/A    Number of Children: N/A  . Years of Education: N/A   Occupational History  . Not on file.   Social History Main Topics  . Smoking status: Never Smoker   . Smokeless tobacco: Never Used  . Alcohol Use: Not on file  . Drug Use: Not on file  . Sexually Active: Not on file     Comment: divorced   Other Topics Concern  . Not on file   Social History Narrative  . No narrative on file   Family History  Problem Relation Age of Onset  . Hypertension Mother   . Hyperlipidemia Mother   . Heart disease Father   . Hyperlipidemia Brother   . Hypertension Brother   . Cancer Maternal  Grandmother     brain tumor  . Cancer Maternal Grandfather     retinoblastoma      Review of Systems  All other systems reviewed and are negative.       Objective:   Physical Exam  Vitals reviewed. Constitutional: She is oriented to person, place, and time. She appears well-developed and well-nourished.  Neck: Neck supple. No thyromegaly present.  Cardiovascular: Normal rate, regular rhythm and normal heart sounds.   No murmur heard. Pulmonary/Chest: Effort normal and breath sounds normal. No respiratory distress. She has no wheezes. She has no rales.  Abdominal: Soft. Bowel sounds are normal. She exhibits no distension. There is no tenderness. There is no rebound and no guarding.  Lymphadenopathy:    She has no cervical adenopathy.  Neurological: She is alert and oriented to person, place, and time. No cranial nerve deficit. Coordination normal.  Skin: Skin is warm. No rash noted. No erythema. No pallor.          Assessment & Plan:  1. HLD (hyperlipidemia) Patient's goal LDL is less than 130. Check fasting lipid panel today to see if she has achieved that goal. Also encouraged a low fat diet. - COMPLETE METABOLIC PANEL WITH GFR - Lipid panel  2. HTN (hypertension) Blood  pressures currently well controlled, continue Cozaar 100 mg by mouth daily.  3. Obesity, unspecified Discussed therapeutic lifestyle changes including a 1500-calorie per day diet and 30 minutes of aerobic exercise 5 days a week. Patient will also like to try FemCare mean 37.5 mg by mouth every morning. I gave her 30 tablets with 2 refills. I warned about habituation. I instructed the patient that I would not prescribe longer than 3 months and at that medicine should be viewed as a total to be used in combination with therapeutic lifestyle changes.

## 2013-02-06 LAB — HEMOGLOBIN A1C, FINGERSTICK: Hgb A1C (fingerstick): 5.5 % (ref <5.7)

## 2013-02-06 NOTE — Addendum Note (Signed)
Addended by: Reginia Forts on: 02/06/2013 02:16 PM   Modules accepted: Orders

## 2013-02-11 ENCOUNTER — Telehealth: Payer: Self-pay | Admitting: Family Medicine

## 2013-02-11 MED ORDER — SIMVASTATIN 40 MG PO TABS: 40.0000 mg | ORAL_TABLET | Freq: Every evening | ORAL | Status: DC

## 2013-02-11 NOTE — Telephone Encounter (Signed)
Switch to zocor 40 poqday.

## 2013-02-11 NOTE — Telephone Encounter (Signed)
Rx Refilled  

## 2013-02-18 ENCOUNTER — Telehealth: Payer: Self-pay | Admitting: Family Medicine

## 2013-02-18 MED ORDER — SIMVASTATIN 40 MG PO TABS: 40.0000 mg | ORAL_TABLET | Freq: Every evening | ORAL | Status: DC

## 2013-02-18 NOTE — Telephone Encounter (Signed)
Rx Refilled  

## 2013-03-12 ENCOUNTER — Other Ambulatory Visit: Payer: Self-pay | Admitting: Family Medicine

## 2013-03-12 NOTE — Telephone Encounter (Signed)
Med refill

## 2013-05-19 ENCOUNTER — Other Ambulatory Visit: Payer: Self-pay | Admitting: Family Medicine

## 2013-06-06 ENCOUNTER — Ambulatory Visit (INDEPENDENT_AMBULATORY_CARE_PROVIDER_SITE_OTHER): Payer: BC Managed Care – PPO | Admitting: Family Medicine

## 2013-06-06 ENCOUNTER — Encounter: Payer: Self-pay | Admitting: Family Medicine

## 2013-06-06 VITALS — BP 120/80 | HR 80 | Temp 97.4°F | Resp 20 | Wt 168.0 lb

## 2013-06-06 DIAGNOSIS — F439 Reaction to severe stress, unspecified: Secondary | ICD-10-CM

## 2013-06-06 DIAGNOSIS — Z733 Stress, not elsewhere classified: Secondary | ICD-10-CM

## 2013-06-06 NOTE — Progress Notes (Signed)
  Subjective:    Patient ID: Stephanie [name], female    DOB: [birth year], 47 y.o.   MRN: [id number]  HPI  Patient is here today to discuss some of the stress she's having with her son. Her son is 37 years old. He lives at home. He basically lives a life of a hermit.  He does not work. He does not date. He does not have any serious friendships. He avoids crowds. He avoids going out due to fear of crowds. It sounds like he may have or phobia. He is also withdrawn. He has trouble concentrating and trouble with insomnia. He is told his mother he's "not sure what's the point of being here."  It sounds like the patient may be given with anxiety disorder panic attacks and depression. Mom is interested in options to treat this and wants to discuss these options as the patient is afraid to come in. Past Medical History  Diagnosis Date  . Hyperlipidemia   . Hypertension    Current Outpatient Prescriptions on File Prior to Visit  Medication Sig Dispense Refill  . losartan (COZAAR) 100 MG tablet TAKE ONE TABLET BY MOUTH EVERY DAY  30 tablet  5  . simvastatin (ZOCOR) 40 MG tablet Take 1 tablet (40 mg total) by mouth every evening.  30 tablet  3  . Vitamin D, Ergocalciferol, (DRISDOL) 50000 UNITS CAPS Take 50,000 Units by mouth.      . phentermine 37.5 MG capsule Take 1 capsule (37.5 mg total) by mouth every morning.  30 capsule  2   No current facility-administered medications on file prior to visit.   No Known Allergies   Review of Systems  All other systems reviewed and are negative.       Objective:   Physical Exam  Vitals reviewed. 20 minutes was spent in discussion with the patient regarding her son's condition. There was no physical exam.        Assessment & Plan:  1. Situational stress Obviously, I cannot diagnose or treat her son without actually seeing him as a patient and talking to him. However it is my opinion that he may be dealing with generalized anxiety disorder, panic  attacks, agoraphobia, and possibly depression.  We discussed possible treatment options including SSRIs, counseling, benzodiazepines. I recommended that the patient make an appointment for her son and try to bring him in to be seen for that I can talk with him personally about the best treatment options for him.

## 2013-07-25 ENCOUNTER — Other Ambulatory Visit: Payer: Self-pay

## 2013-07-25 DIAGNOSIS — Z1231 Encounter for screening mammogram for malignant neoplasm of breast: Secondary | ICD-10-CM

## 2013-08-26 ENCOUNTER — Ambulatory Visit
Admission: RE | Admit: 2013-08-26 | Discharge: 2013-08-26 | Disposition: A | Discharge status: Home / Self Care | Payer: BC Managed Care – PPO | Source: Ambulatory Visit

## 2013-08-26 DIAGNOSIS — Z1231 Encounter for screening mammogram for malignant neoplasm of breast: Secondary | ICD-10-CM

## 2013-09-16 ENCOUNTER — Telehealth: Payer: Self-pay | Admitting: Family Medicine

## 2013-09-16 NOTE — Telephone Encounter (Signed)
Pt has not been seen since 10/14 and must pay balance before we schedule appt. Do you want to recommend something OTC?

## 2013-09-16 NOTE — Telephone Encounter (Signed)
..  Patient aware per vm 

## 2013-09-16 NOTE — Telephone Encounter (Signed)
mucinex dm otc for cough, coricidan hbp for congestion.

## 2013-09-16 NOTE — Telephone Encounter (Signed)
Pt ears are draining and both ears hurt  And pt has a cough  Pharmacy is [place]  Call back number is [phone]

## 2013-11-08 ENCOUNTER — Telehealth: Payer: Self-pay | Admitting: *Deleted

## 2014-01-02 ENCOUNTER — Other Ambulatory Visit: Payer: Self-pay | Admitting: Family Medicine

## 2014-01-02 MED ORDER — SIMVASTATIN 40 MG PO TABS: 40.0000 mg | ORAL_TABLET | Freq: Every evening | ORAL | Status: DC

## 2014-01-02 MED ORDER — LOSARTAN POTASSIUM 100 MG PO TABS: ORAL_TABLET | ORAL | Status: DC

## 2014-01-02 NOTE — Telephone Encounter (Signed)
Med sent to pharm and pt will schedule an appt.

## 2014-01-22 ENCOUNTER — Other Ambulatory Visit: Payer: Self-pay

## 2014-02-16 ENCOUNTER — Emergency Department (HOSPITAL_COMMUNITY)
Admission: EM | Admit: 2014-02-16 | Discharge: 2014-02-16 | Disposition: A | Discharge status: Home / Self Care | Payer: BC Managed Care – PPO | Attending: Emergency Medicine | Admitting: Emergency Medicine

## 2014-02-16 ENCOUNTER — Emergency Department (HOSPITAL_COMMUNITY): Payer: BC Managed Care – PPO

## 2014-02-16 ENCOUNTER — Encounter (HOSPITAL_COMMUNITY): Payer: Self-pay | Admitting: Emergency Medicine

## 2014-02-16 DIAGNOSIS — M47812 Spondylosis without myelopathy or radiculopathy, cervical region: Secondary | ICD-10-CM | POA: Insufficient documentation

## 2014-02-16 DIAGNOSIS — Z79899 Other long term (current) drug therapy: Secondary | ICD-10-CM | POA: Insufficient documentation

## 2014-02-16 DIAGNOSIS — Z8639 Personal history of other endocrine, nutritional and metabolic disease: Secondary | ICD-10-CM | POA: Insufficient documentation

## 2014-02-16 DIAGNOSIS — R42 Dizziness and giddiness: Secondary | ICD-10-CM | POA: Insufficient documentation

## 2014-02-16 DIAGNOSIS — Z3202 Encounter for pregnancy test, result negative: Secondary | ICD-10-CM | POA: Insufficient documentation

## 2014-02-16 DIAGNOSIS — I10 Essential (primary) hypertension: Secondary | ICD-10-CM | POA: Insufficient documentation

## 2014-02-16 DIAGNOSIS — Z862 Personal history of diseases of the blood and blood-forming organs and certain disorders involving the immune mechanism: Secondary | ICD-10-CM | POA: Insufficient documentation

## 2014-02-16 DIAGNOSIS — M62838 Other muscle spasm: Secondary | ICD-10-CM | POA: Insufficient documentation

## 2014-02-16 HISTORY — DX: Pure hypercholesterolemia, unspecified: E78.00

## 2014-02-16 LAB — URINALYSIS, ROUTINE W REFLEX MICROSCOPIC
BILIRUBIN URINE: NEGATIVE
Glucose, UA: NEGATIVE mg/dL
Hgb urine dipstick: NEGATIVE
KETONES UR: NEGATIVE mg/dL
NITRITE: NEGATIVE
PH: 6 (ref 5.0–8.0)
PROTEIN: NEGATIVE mg/dL
Specific Gravity, Urine: 1.01 (ref 1.005–1.030)
UROBILINOGEN UA: 1 mg/dL (ref 0.0–1.0)

## 2014-02-16 LAB — URINE MICROSCOPIC-ADD ON

## 2014-02-16 LAB — CBC WITH DIFFERENTIAL/PLATELET
BASOS ABS: 0 10*3/uL (ref 0.0–0.1)
BASOS PCT: 0 % (ref 0–1)
EOS ABS: 0.3 10*3/uL (ref 0.0–0.7)
EOS PCT: 4 % (ref 0–5)
HEMATOCRIT: 38.8 % (ref 36.0–46.0)
Hemoglobin: 13.7 g/dL (ref 12.0–15.0)
Lymphocytes Relative: 44 % (ref 12–46)
Lymphs Abs: 3 10*3/uL (ref 0.7–4.0)
MCH: 31.6 pg (ref 26.0–34.0)
MCHC: 35.3 g/dL (ref 30.0–36.0)
MCV: 89.6 fL (ref 78.0–100.0)
MONO ABS: 0.3 10*3/uL (ref 0.1–1.0)
Monocytes Relative: 4 % (ref 3–12)
Neutro Abs: 3.2 10*3/uL (ref 1.7–7.7)
Neutrophils Relative %: 48 % (ref 43–77)
Platelets: 245 10*3/uL (ref 150–400)
RBC: 4.33 MIL/uL (ref 3.87–5.11)
RDW: 12.9 % (ref 11.5–15.5)
WBC: 6.8 10*3/uL (ref 4.0–10.5)

## 2014-02-16 LAB — BASIC METABOLIC PANEL
BUN: 9 mg/dL (ref 6–23)
CALCIUM: 9.1 mg/dL (ref 8.4–10.5)
CHLORIDE: 102 meq/L (ref 96–112)
CO2: 27 meq/L (ref 19–32)
CREATININE: 0.67 mg/dL (ref 0.50–1.10)
GFR calc Af Amer: >90 mL/min (ref >90)
GFR calc non Af Amer: >90 mL/min (ref >90)
Glucose, Bld: 138 mg/dL — ABNORMAL HIGH (ref 70–99)
Potassium: 3.7 mEq/L (ref 3.7–5.3)
Sodium: 140 mEq/L (ref 137–147)

## 2014-02-16 LAB — PREGNANCY, URINE: Preg Test, Ur: NEGATIVE

## 2014-02-16 MED ORDER — MECLIZINE HCL 12.5 MG PO TABS
25.0000 mg | ORAL_TABLET | Freq: Once | ORAL | Status: AC
  Administered 2014-02-16: 25 mg via ORAL
  Filled 2014-02-16: qty 2

## 2014-02-16 MED ORDER — DIAZEPAM 5 MG PO TABS: 5.0000 mg | ORAL_TABLET | Freq: Three times a day (TID) | ORAL | Status: DC | PRN

## 2014-02-16 MED ORDER — IBUPROFEN 600 MG PO TABS: 600.0000 mg | ORAL_TABLET | Freq: Four times a day (QID) | ORAL | Status: DC | PRN

## 2014-02-16 MED ORDER — MECLIZINE HCL 25 MG PO TABS: 25.0000 mg | ORAL_TABLET | Freq: Three times a day (TID) | ORAL | Status: DC | PRN

## 2014-02-16 NOTE — ED Provider Notes (Signed)
CSN: [id number]     Arrival date & time 02/16/14  1002 History   First MD Initiated Contact with Patient 02/16/14 1023     Chief Complaint  Patient presents with  . Headache     (Consider location/radiation/quality/duration/timing/severity/associated sxs/prior Treatment) Patient is a 48 y.o. female presenting with headaches.  Headache Associated symptoms: dizziness   Associated symptoms: no abdominal pain, no congestion, no fever, no nausea, no neck pain, no numbness and no sore throat     Kimbly Eanes is a 48 y.o. female presenting with a 2 week history of intermittent dizziness and headache pain which radiates into her neck.  She reports unsteadiness when the dizziness is present but denies vertigo.  Her headache is occipital and radiates into her right lateral posterior neck.  She denies any recent head injury or trauma and denies nausea, vomiting, light sensitivity, vision changes, focal weakness,chest pain, sob, palpitations,  fevers, chills or recent infections including uri or sinus/ear infections.  She has taken ibuprofen, her last dose was taken last night before bed and she does know that it relieves her sx as they are intermittent regardless of activity or medications.  She denies any reason for dehydration, denies prior history of migraine headache.  She is treated for well controlled htn and cholesterol.  She fell last night secondary to feeling dizzy, sustaining a mild abrasion to her right lower leg.     Past Medical History  Diagnosis Date  . Hypertension   . High cholesterol    Past Surgical History  Procedure Laterality Date  . Cholecystectomy    . Cesarean section     History reviewed. No pertinent family history. History  Substance Use Topics  . Smoking status: Never Smoker   . Smokeless tobacco: Not on file  . Alcohol Use: No   OB History   Grav Para Term Preterm Abortions TAB SAB Ect Mult Living                 Review of Systems  Constitutional:  Negative for fever and chills.  HENT: Negative for congestion and sore throat.   Eyes: Negative.   Respiratory: Negative for chest tightness and shortness of breath.   Cardiovascular: Negative for chest pain.  Gastrointestinal: Negative for nausea and abdominal pain.  Genitourinary: Negative.   Musculoskeletal: Negative for arthralgias, joint swelling and neck pain.  Skin: Negative.  Negative for rash and wound.  Neurological: Positive for dizziness and headaches. Negative for syncope, speech difficulty, weakness, light-headedness and numbness.  Psychiatric/Behavioral: Negative.       Allergies  Review of patient's allergies indicates no known allergies.  Home Medications   Prior to Admission medications   Medication Sig Start Date End Date Taking? Authorizing Provider  diazepam (VALIUM) 5 MG tablet Take 1 tablet (5 mg total) by mouth every 8 (eight) hours as needed for muscle spasms. 02/16/14   Evalee Jefferson, PA-C  ibuprofen (ADVIL,MOTRIN) 600 MG tablet Take 1 tablet (600 mg total) by mouth every 6 (six) hours as needed. 02/16/14   Evalee Jefferson, PA-C  meclizine (ANTIVERT) 25 MG tablet Take 1 tablet (25 mg total) by mouth 3 (three) times daily as needed for dizziness. 02/16/14   Evalee Jefferson, PA-C   BP 123/83  Pulse 71  Temp(Src) 97.8 F (36.6 C) (Oral)  Resp 14  Ht 5\' 2"  (1.575 m)  Wt 140 lb (63.504 kg)  BMI 25.60 kg/m2  SpO2 96% Physical Exam  Nursing note and vitals reviewed. Constitutional: She appears  well-developed and well-nourished. No distress.  HENT:  Head: Normocephalic and atraumatic.  Right Ear: Tympanic membrane normal.  Left Ear: Tympanic membrane normal.  Mouth/Throat: Uvula is midline and oropharynx is clear and moist.  Eyes: Conjunctivae are normal.  Neck: Normal range of motion. Muscular tenderness present. No rigidity. No edema, no erythema and normal range of motion present.  ttp right cervical soft tissue.  Scalp tender right occiput.  Cardiovascular: Normal  rate, regular rhythm, normal heart sounds and intact distal pulses.   Pulses:      Radial pulses are 2+ on the right side, and 2+ on the left side.       Posterior tibial pulses are 2+ on the right side, and 2+ on the left side.  Pulmonary/Chest: Effort normal and breath sounds normal. She has no decreased breath sounds. She has no wheezes. She has no rhonchi.  Abdominal: Soft. Bowel sounds are normal. There is no tenderness.  Musculoskeletal: Normal range of motion.  Neurological: She is alert. She has normal strength. No cranial nerve deficit or sensory deficit. GCS eye subscore is 4. GCS verbal subscore is 5. GCS motor subscore is 6.  Equal grip strength.  Skin: Skin is warm and dry.  Psychiatric: She has a normal mood and affect.    ED Course  Procedures (including critical care time) Labs Review Labs Reviewed  BASIC METABOLIC PANEL - Abnormal; Notable for the following:    Glucose, Bld 138 (*)    All other components within normal limits  URINALYSIS, ROUTINE W REFLEX MICROSCOPIC - Abnormal; Notable for the following:    APPearance HAZY (*)    Leukocytes, UA SMALL (*)    All other components within normal limits  URINE MICROSCOPIC-ADD ON - Abnormal; Notable for the following:    Squamous Epithelial / LPF FEW (*)    Bacteria, UA FEW (*)    All other components within normal limits  CBC WITH DIFFERENTIAL  PREGNANCY, URINE    Imaging Review Ct Head Wo Contrast  02/16/2014   CLINICAL DATA:  48 year old female with neck pain, headache. Initial encounter. No known injury.  EXAM: CT HEAD WITHOUT CONTRAST  CT CERVICAL SPINE WITHOUT CONTRAST  TECHNIQUE: Multidetector CT imaging of the head and cervical spine was performed following the standard protocol without intravenous contrast. Multiplanar CT image reconstructions of the cervical spine were also generated.  COMPARISON:  None.  FINDINGS: CT HEAD FINDINGS  Mild sphenoid sinus mucosal thickening. Other Visualized paranasal sinuses and  mastoids are clear. No acute osseous abnormality identified. Visualized orbits and scalp soft tissues are within normal limits.  No midline shift, ventriculomegaly, mass effect, evidence of mass lesion, intracranial hemorrhage or evidence of cortically based acute infarction. Geppert-white matter differentiation is within normal limits throughout the brain. No suspicious intracranial vascular hyperdensity.  CT CERVICAL SPINE FINDINGS  Mild reversal of cervical lordosis. Visualized skull base is intact. No atlanto-occipital dissociation. Bilateral posterior element alignment is within normal limits. Cervicothoracic junction alignment is within normal limits. No acute cervical spine fracture identified.  Chronic disc and endplate degeneration at C4-C5 and C5-C6. Suspect Mild to moderate subsequent spinal stenosis. Moderate perhaps up to severe neural foraminal stenosis occurs maximal at the right C6 nerve level.  Grossly intact visualized upper thoracic levels. Negative lung apices. Negative paraspinal soft tissues.  IMPRESSION: 1.  Normal noncontrast CT appearance of the brain. 2. No acute fracture or listhesis identified in the cervical spine. Ligamentous injury is not excluded. 3. Chronic disc and endplate degeneration  at C4-C5 and C5-C6 resulting in mild to moderate spinal stenosis and up to moderate to severe right C6 foraminal stenosis.   Electronically Signed   By: Lars Pinks M.D.   On: 02/16/2014 11:57   Ct Cervical Spine Wo Contrast  02/16/2014   CLINICAL DATA:  48 year old female with neck pain, headache. Initial encounter. No known injury.  EXAM: CT HEAD WITHOUT CONTRAST  CT CERVICAL SPINE WITHOUT CONTRAST  TECHNIQUE: Multidetector CT imaging of the head and cervical spine was performed following the standard protocol without intravenous contrast. Multiplanar CT image reconstructions of the cervical spine were also generated.  COMPARISON:  None.  FINDINGS: CT HEAD FINDINGS  Mild sphenoid sinus mucosal  thickening. Other Visualized paranasal sinuses and mastoids are clear. No acute osseous abnormality identified. Visualized orbits and scalp soft tissues are within normal limits.  No midline shift, ventriculomegaly, mass effect, evidence of mass lesion, intracranial hemorrhage or evidence of cortically based acute infarction. Fludd-white matter differentiation is within normal limits throughout the brain. No suspicious intracranial vascular hyperdensity.  CT CERVICAL SPINE FINDINGS  Mild reversal of cervical lordosis. Visualized skull base is intact. No atlanto-occipital dissociation. Bilateral posterior element alignment is within normal limits. Cervicothoracic junction alignment is within normal limits. No acute cervical spine fracture identified.  Chronic disc and endplate degeneration at C4-C5 and C5-C6. Suspect Mild to moderate subsequent spinal stenosis. Moderate perhaps up to severe neural foraminal stenosis occurs maximal at the right C6 nerve level.  Grossly intact visualized upper thoracic levels. Negative lung apices. Negative paraspinal soft tissues.  IMPRESSION: 1.  Normal noncontrast CT appearance of the brain. 2. No acute fracture or listhesis identified in the cervical spine. Ligamentous injury is not excluded. 3. Chronic disc and endplate degeneration at C4-C5 and C5-C6 resulting in mild to moderate spinal stenosis and up to moderate to severe right C6 foraminal stenosis.   Electronically Signed   By: Lars Pinks M.D.   On: 02/16/2014 11:57     EKG Interpretation None      MDM   Final diagnoses:  Cervical paraspinal muscle spasm  Degenerative joint disease of cervical spine   Patients labs and/or radiological studies were viewed and considered during the medical decision making and disposition process.  Patient was given antivert with improvement in dizziness.  She ambulated in dept without dizziness.  Labs/ Ct findings without source of sx found.  Discussed spinal stenosis/djd c spine  findings.  Pt discussed with Dr Jeneen Rinks prior to dc home.  Will prescribed additional meclizine but also valium and ibuprofen for suspected muscle spasm/cervical induced vertigo.  Planned f/u with pcp this week.    Evalee Jefferson, PA-C 02/16/14 1649

## 2014-02-16 NOTE — ED Notes (Signed)
Pt c/o intermittent headache with dizziness x2 week. Pt states pain is in back of head and radiates to back of neck. Pt denies blurry vision, light sensitivity. Pt denies weakness.

## 2014-02-16 NOTE — ED Notes (Addendum)
Patient with no complaints at this time. Respirations even and unlabored. Skin warm/dry. Discharge instructions reviewed with patient at this time. Patient given opportunity to voice concerns/ask questions. Patient left ER w/steady gait.

## 2014-02-16 NOTE — Discharge Instructions (Signed)
Use the medicines prescribed.  I also recommend applying a heating pad to your neck and posterior scalp 20 minutes several times daily which may help with muscle soreness.    Do not drive within 4 hours of taking valium as this will make you drowsy.  Avoid lifting,  Bending,  Twisting or any other activity that worsens your pain over the next week.    You should get rechecked if your symptoms are not better over the next 5 days,  Or you develop increased pain,  Weakness in your leg(s) or loss of bladder or bowel function - these are symptoms of a worse injury.

## 2014-02-17 ENCOUNTER — Encounter: Payer: Self-pay | Admitting: Family Medicine

## 2014-02-17 NOTE — ED Provider Notes (Signed)
Medical screening examination/treatment/procedure(s) were performed by non-physician practitioner and as supervising physician I was immediately available for consultation/collaboration.   EKG Interpretation None        Lakyia Behe, MD 02/17/14 1406 

## 2014-02-25 ENCOUNTER — Encounter: Payer: Self-pay | Admitting: Family Medicine

## 2014-02-25 ENCOUNTER — Ambulatory Visit (INDEPENDENT_AMBULATORY_CARE_PROVIDER_SITE_OTHER): Payer: BC Managed Care – PPO | Admitting: Family Medicine

## 2014-02-25 VITALS — BP 128/74 | HR 76 | Temp 97.6°F | Resp 14 | Ht 62.0 in | Wt 174.0 lb

## 2014-02-25 DIAGNOSIS — I10 Essential (primary) hypertension: Secondary | ICD-10-CM

## 2014-02-25 DIAGNOSIS — Z23 Encounter for immunization: Secondary | ICD-10-CM

## 2014-02-25 DIAGNOSIS — E785 Hyperlipidemia, unspecified: Secondary | ICD-10-CM

## 2014-02-25 LAB — COMPLETE METABOLIC PANEL WITH GFR
ALK PHOS: 75 U/L (ref 39–117)
ALT: 98 U/L — ABNORMAL HIGH (ref 0–35)
AST: 104 U/L — ABNORMAL HIGH (ref 0–37)
Albumin: 4.5 g/dL (ref 3.5–5.2)
BUN: 7 mg/dL (ref 6–23)
CALCIUM: 9.5 mg/dL (ref 8.4–10.5)
CHLORIDE: 102 meq/L (ref 96–112)
CO2: 27 mEq/L (ref 19–32)
Creat: 0.69 mg/dL (ref 0.50–1.10)
GFR, Est African American: >89 mL/min
GFR, Est Non African American: >89 mL/min
GLUCOSE: 104 mg/dL — AB (ref 70–99)
POTASSIUM: 4.3 meq/L (ref 3.5–5.3)
Sodium: 140 mEq/L (ref 135–145)
TOTAL PROTEIN: 7.4 g/dL (ref 6.0–8.3)
Total Bilirubin: 0.4 mg/dL (ref 0.2–1.2)

## 2014-02-25 LAB — LIPID PANEL
CHOL/HDL RATIO: 5.5 ratio
Cholesterol: 218 mg/dL — ABNORMAL HIGH (ref 0–200)
HDL: 40 mg/dL (ref >39)
LDL Cholesterol: 156 mg/dL — ABNORMAL HIGH (ref 0–99)
Triglycerides: 110 mg/dL (ref <150)
VLDL: 22 mg/dL (ref 0–40)

## 2014-02-25 MED ORDER — LOSARTAN POTASSIUM 100 MG PO TABS: ORAL_TABLET | ORAL | Status: DC

## 2014-02-25 NOTE — Progress Notes (Signed)
   Subjective:    Patient ID: Stephanie [name], female    DOB: 06/27/1966, 48 y.o.   MRN: [id number]  HPI  Patient has a history of hypertension and hyperlipidemia. Currently her blood pressure is well controlled on losartan 100 mg by mouth daily.  She denies any chest pain shortness of breath or dyspnea on exertion. She also has a history of hyperlipidemia which is currently managed with simvastatin 40 mg by mouth daily. She denies any myalgias or right upper quadrant pain. She is requesting a refill again on phentermine for weight loss. Past Medical History  Diagnosis Date  . Hyperlipidemia   . Hypertension   . High cholesterol    Past Surgical History  Procedure Laterality Date  . Cholecystectomy    . Cesarean section     Current Outpatient Prescriptions on File Prior to Visit  Medication Sig Dispense Refill  . meclizine (ANTIVERT) 25 MG tablet Take 1 tablet (25 mg total) by mouth 3 (three) times daily as needed for dizziness.  28 tablet  0  . simvastatin (ZOCOR) 40 MG tablet Take 1 tablet (40 mg total) by mouth every evening.  30 tablet  0  . phentermine 37.5 MG capsule Take 1 capsule (37.5 mg total) by mouth every morning.  30 capsule  2   No current facility-administered medications on file prior to visit.   No Known Allergies History   Social History  . Marital Status: Single    Spouse Name: N/A    Number of Children: N/A  . Years of Education: N/A   Occupational History  . Not on file.   Social History Main Topics  . Smoking status: Never Smoker   . Smokeless tobacco: Not on file  . Alcohol Use: No  . Drug Use: Not on file  . Sexual Activity: Yes     Comment: divorced   Other Topics Concern  . Not on file   Social History Narrative   ** Merged History Encounter **         Review of Systems  All other systems reviewed and are negative.      Objective:   Physical Exam  Vitals reviewed. Constitutional: She appears well-developed and well-nourished.  No distress.  Neck: Neck supple. No JVD present. No thyromegaly present.  Cardiovascular: Normal rate, regular rhythm and normal heart sounds.   No murmur heard. Pulmonary/Chest: Effort normal and breath sounds normal. No respiratory distress. She has no wheezes. She has no rales. She exhibits no tenderness.  Abdominal: Soft. Bowel sounds are normal. She exhibits no distension and no mass. There is no tenderness. There is no rebound and no guarding.  Lymphadenopathy:    She has no cervical adenopathy.  Skin: She is not diaphoretic.          Assessment & Plan:  1. Essential hypertension Blood pressures well controlled. Continue current medication at present does. I will also check a fasting lipid panel and CMP. LDL cholesterol is less than 130. - COMPLETE METABOLIC PANEL WITH GFR - Lipid panel  2. Need for prophylactic vaccination and inoculation against unspecified single disease - Tdap vaccine greater than or equal to 7yo IM

## 2014-02-28 ENCOUNTER — Other Ambulatory Visit: Payer: Self-pay | Admitting: Family Medicine

## 2014-02-28 ENCOUNTER — Telehealth: Payer: Self-pay | Admitting: *Deleted

## 2014-02-28 DIAGNOSIS — R945 Abnormal results of liver function studies: Principal | ICD-10-CM

## 2014-02-28 DIAGNOSIS — R7989 Other specified abnormal findings of blood chemistry: Secondary | ICD-10-CM

## 2014-02-28 NOTE — Telephone Encounter (Signed)
Pt states she has done research on Contrave and wants to go ahead and try it.   Pharmacy [facility]

## 2014-02-28 NOTE — Telephone Encounter (Signed)
contrave 2 pobid, begin 1 poqam and increase by 1 pill every week until on 2 bid. 1 week- 1 poqam 2 week 1 pobid 3 week 2 am 1 pm 4 week 2 am, 2 pm

## 2014-03-01 MED ORDER — NALTREXONE-BUPROPION HCL ER 8-90 MG PO TB12: ORAL_TABLET | ORAL | Status: DC

## 2014-03-01 NOTE — Telephone Encounter (Signed)
Med sent to pharm 

## 2014-03-06 ENCOUNTER — Telehealth: Payer: Self-pay | Admitting: *Deleted

## 2014-03-06 NOTE — Telephone Encounter (Signed)
Pt called stating that the medication Contrave is going to cost her 275.00 dollars with insurance and with insurance she will need a prior authorization, wants to know if she can try something else, she mentioned Adipex to try that one.Please advise.

## 2014-03-07 NOTE — Telephone Encounter (Signed)
adipex 37.5 mg poqam for 3 months NO refills after 3 months.

## 2014-03-11 MED ORDER — PHENTERMINE HCL 37.5 MG PO CAPS: 37.5000 mg | ORAL_CAPSULE | ORAL | Status: DC

## 2014-03-11 NOTE — Telephone Encounter (Signed)
Script called in to pharmacy left on vm, pt aware.

## 2014-03-28 ENCOUNTER — Telehealth: Payer: Self-pay | Admitting: *Deleted

## 2014-03-28 NOTE — Telephone Encounter (Signed)
Received call from patient.   Reports that during her lunch break today, she went to her mother's where she was walking on gravel and twisted her R foot.   States that now she has pain in her foot and toes.   Requested order for x-ray to be sent to [facility]. [phone], opt 5- Jan[place]/ [phone] fax.  MD please advise.

## 2014-03-28 NOTE — Telephone Encounter (Signed)
Ok with order

## 2014-03-28 NOTE — Telephone Encounter (Signed)
Order faxed to [facility] and patient made aware.

## 2014-03-29 NOTE — Telephone Encounter (Signed)
Received x-ray results.   No fracture or dislocation seen.   Call placed to patient and patient made aware per VM.   Advised ICE and OTC NSAID's for pain and to F/U Monday if not better.

## 2014-03-31 ENCOUNTER — Other Ambulatory Visit: Payer: BC Managed Care – PPO

## 2014-03-31 ENCOUNTER — Other Ambulatory Visit: Payer: Self-pay | Admitting: Family Medicine

## 2014-03-31 DIAGNOSIS — R945 Abnormal results of liver function studies: Principal | ICD-10-CM

## 2014-03-31 DIAGNOSIS — R7989 Other specified abnormal findings of blood chemistry: Secondary | ICD-10-CM

## 2014-03-31 LAB — COMPLETE METABOLIC PANEL WITH GFR
ALBUMIN: 4.5 g/dL (ref 3.5–5.2)
ALK PHOS: 79 U/L (ref 39–117)
ALT: 78 U/L — ABNORMAL HIGH (ref 0–35)
AST: 66 U/L — AB (ref 0–37)
BUN: 11 mg/dL (ref 6–23)
CHLORIDE: 101 meq/L (ref 96–112)
CO2: 27 mEq/L (ref 19–32)
Calcium: 9.5 mg/dL (ref 8.4–10.5)
Creat: 0.77 mg/dL (ref 0.50–1.10)
GFR, Est African American: >89 mL/min
GFR, Est Non African American: >89 mL/min
Glucose, Bld: 91 mg/dL (ref 70–99)
POTASSIUM: 3.8 meq/L (ref 3.5–5.3)
Sodium: 138 mEq/L (ref 135–145)
TOTAL PROTEIN: 7.2 g/dL (ref 6.0–8.3)
Total Bilirubin: 0.6 mg/dL (ref 0.2–1.2)

## 2014-04-02 LAB — HEPATITIS PANEL, ACUTE
HCV Ab: NEGATIVE
HEP A IGM: NONREACTIVE
Hep B C IgM: NONREACTIVE
Hepatitis B Surface Ag: NEGATIVE

## 2014-04-03 ENCOUNTER — Telehealth: Payer: Self-pay | Admitting: Family Medicine

## 2014-04-03 ENCOUNTER — Other Ambulatory Visit: Payer: Self-pay | Admitting: *Deleted

## 2014-04-03 DIAGNOSIS — R7989 Other specified abnormal findings of blood chemistry: Secondary | ICD-10-CM

## 2014-04-03 DIAGNOSIS — R945 Abnormal results of liver function studies: Principal | ICD-10-CM

## 2014-04-03 NOTE — Telephone Encounter (Signed)
Duplicate chart

## 2014-04-03 NOTE — Telephone Encounter (Signed)
Calling about lab results.

## 2014-04-04 ENCOUNTER — Ambulatory Visit
Admission: RE | Admit: 2014-04-04 | Discharge: 2014-04-04 | Disposition: A | Discharge status: Home / Self Care | Payer: BC Managed Care – PPO | Source: Ambulatory Visit | Attending: Family Medicine | Admitting: Family Medicine

## 2014-04-04 ENCOUNTER — Encounter (INDEPENDENT_AMBULATORY_CARE_PROVIDER_SITE_OTHER): Payer: Self-pay

## 2014-04-04 DIAGNOSIS — R7989 Other specified abnormal findings of blood chemistry: Secondary | ICD-10-CM

## 2014-04-04 DIAGNOSIS — R945 Abnormal results of liver function studies: Principal | ICD-10-CM

## 2014-04-10 ENCOUNTER — Other Ambulatory Visit: Payer: BC Managed Care – PPO

## 2014-04-24 ENCOUNTER — Telehealth: Payer: Self-pay | Admitting: Family Medicine

## 2014-04-24 NOTE — Telephone Encounter (Signed)
Patient is calling you regarding her cholesterol medication  [phone]

## 2014-04-25 NOTE — Telephone Encounter (Signed)
LMTRC

## 2014-04-26 NOTE — Telephone Encounter (Signed)
Call placed to patient. LMTRC.  

## 2014-04-29 NOTE — Telephone Encounter (Signed)
I am sorry but that is a recommendation from the FDA that I am following.  No refill.

## 2014-04-29 NOTE — Telephone Encounter (Signed)
Pt wants to know if she can continue the Adipex for an additional 3 months as she is doing very well and would like to see if she can just to another 3 months. She said please please please.

## 2014-04-30 ENCOUNTER — Encounter: Payer: Self-pay | Admitting: Family Medicine

## 2014-05-01 NOTE — Telephone Encounter (Signed)
Call placed to patient. LMTRC.  

## 2014-05-02 NOTE — Telephone Encounter (Signed)
Pt aware.

## 2014-07-04 ENCOUNTER — Other Ambulatory Visit: Payer: Self-pay

## 2014-07-04 DIAGNOSIS — Z1231 Encounter for screening mammogram for malignant neoplasm of breast: Secondary | ICD-10-CM

## 2014-07-05 ENCOUNTER — Other Ambulatory Visit: Payer: Self-pay | Admitting: Family Medicine

## 2014-07-10 ENCOUNTER — Telehealth: Payer: Self-pay | Admitting: Family Medicine

## 2014-07-10 NOTE — Telephone Encounter (Signed)
Patient is calling to speak with you regarding her blood pressure and cholesterol meds please call her at [phone]

## 2014-07-11 NOTE — Telephone Encounter (Signed)
LMTRC

## 2014-07-21 NOTE — Telephone Encounter (Signed)
Spoke to pt and questions answered  

## 2014-07-31 ENCOUNTER — Ambulatory Visit: Payer: BC Managed Care – PPO | Admitting: Family Medicine

## 2014-09-01 ENCOUNTER — Ambulatory Visit
Admission: RE | Admit: 2014-09-01 | Discharge: 2014-09-01 | Disposition: A | Discharge status: Home / Self Care | Payer: BLUE CROSS/BLUE SHIELD | Source: Ambulatory Visit

## 2014-09-01 DIAGNOSIS — Z1231 Encounter for screening mammogram for malignant neoplasm of breast: Secondary | ICD-10-CM

## 2014-09-05 ENCOUNTER — Ambulatory Visit (INDEPENDENT_AMBULATORY_CARE_PROVIDER_SITE_OTHER): Payer: BLUE CROSS/BLUE SHIELD | Admitting: Family Medicine

## 2014-09-05 ENCOUNTER — Encounter: Payer: Self-pay | Admitting: Family Medicine

## 2014-09-05 VITALS — BP 110/66 | HR 72 | Temp 97.6°F | Resp 18 | Wt 166.0 lb

## 2014-09-05 DIAGNOSIS — M25562 Pain in left knee: Secondary | ICD-10-CM

## 2014-09-05 DIAGNOSIS — E785 Hyperlipidemia, unspecified: Secondary | ICD-10-CM

## 2014-09-05 DIAGNOSIS — I10 Essential (primary) hypertension: Secondary | ICD-10-CM

## 2014-09-05 LAB — LIPID PANEL
CHOL/HDL RATIO: 4.5 ratio
Cholesterol: 186 mg/dL (ref 0–200)
HDL: 41 mg/dL (ref >39)
LDL Cholesterol: 124 mg/dL — ABNORMAL HIGH (ref 0–99)
TRIGLYCERIDES: 103 mg/dL (ref <150)
VLDL: 21 mg/dL (ref 0–40)

## 2014-09-05 LAB — CBC WITH DIFFERENTIAL/PLATELET
BASOS ABS: 0 10*3/uL (ref 0.0–0.1)
Basophils Relative: 0 % (ref 0–1)
Eosinophils Absolute: 0.3 10*3/uL (ref 0.0–0.7)
Eosinophils Relative: 4 % (ref 0–5)
HCT: 40.5 % (ref 36.0–46.0)
Hemoglobin: 13.5 g/dL (ref 12.0–15.0)
LYMPHS PCT: 45 % (ref 12–46)
Lymphs Abs: 3.5 10*3/uL (ref 0.7–4.0)
MCH: 30.8 pg (ref 26.0–34.0)
MCHC: 33.3 g/dL (ref 30.0–36.0)
MCV: 92.3 fL (ref 78.0–100.0)
MONO ABS: 0.3 10*3/uL (ref 0.1–1.0)
MONOS PCT: 4 % (ref 3–12)
MPV: 9.6 fL (ref 8.6–12.4)
Neutro Abs: 3.6 10*3/uL (ref 1.7–7.7)
Neutrophils Relative %: 47 % (ref 43–77)
Platelets: 300 10*3/uL (ref 150–400)
RBC: 4.39 MIL/uL (ref 3.87–5.11)
RDW: 13 % (ref 11.5–15.5)
WBC: 7.7 10*3/uL (ref 4.0–10.5)

## 2014-09-05 LAB — COMPLETE METABOLIC PANEL WITH GFR
ALT: 35 U/L (ref 0–35)
AST: 38 U/L — AB (ref 0–37)
Albumin: 4 g/dL (ref 3.5–5.2)
Alkaline Phosphatase: 76 U/L (ref 39–117)
BILIRUBIN TOTAL: 0.4 mg/dL (ref 0.2–1.2)
BUN: 9 mg/dL (ref 6–23)
CALCIUM: 9.5 mg/dL (ref 8.4–10.5)
CO2: 27 mEq/L (ref 19–32)
Chloride: 101 mEq/L (ref 96–112)
Creat: 0.67 mg/dL (ref 0.50–1.10)
GFR, Est African American: >89 mL/min
Glucose, Bld: 99 mg/dL (ref 70–99)
Potassium: 4.3 mEq/L (ref 3.5–5.3)
SODIUM: 138 meq/L (ref 135–145)
Total Protein: 7.4 g/dL (ref 6.0–8.3)

## 2014-09-05 MED ORDER — DICLOFENAC SODIUM 75 MG PO TBEC: 75.0000 mg | DELAYED_RELEASE_TABLET | Freq: Two times a day (BID) | ORAL | Status: DC

## 2014-09-05 NOTE — Progress Notes (Signed)
Subjective:    Patient ID: Stephanie [name], female    DOB: Jan 06, 1966, 49 y.o.   MRN: [id number]  HPI Patient is here today for follow-up of her hypertension and hyperlipidemia. She also has fatty liver disease. She has no major concerns.  She does complain of pain in the anterior portion of her left knee just inferior to the patella. Her symptoms sound like prepatellar bursitis. The knee mainly hurts her when she puts pressure on her knee for instance when she is on her knees to mop cleaner floors. She denies any pain with walking or exercise or going up and down steps. The patient has been there for approximately 3 weeks. She has been taking ibuprofen 800 mg every 6 hours with minimal relief.  She denies chest pain or shortness of breath or dyspnea on exertion. Her blood pressure is excellent today 110/66. She is overdue for fasting lab work. Past Medical History  Diagnosis Date  . Hyperlipidemia   . Hypertension   . High cholesterol    Past Surgical History  Procedure Laterality Date  . Cholecystectomy    . Cesarean section     Current Outpatient Prescriptions on File Prior to Visit  Medication Sig Dispense Refill  . losartan (COZAAR) 100 MG tablet TAKE ONE TABLET BY MOUTH EVERY DAY 30 tablet 11  . simvastatin (ZOCOR) 40 MG tablet TAKE 1 TABLET BY MOUTH ONCE EVERY EVENING 30 tablet 3  . meclizine (ANTIVERT) 25 MG tablet Take 1 tablet (25 mg total) by mouth 3 (three) times daily as needed for dizziness. (Patient not taking: Reported on 09/05/2014) 28 tablet 0  . Naltrexone-Bupropion HCl ER 8-90 MG TB12 Begin 1 poqam and increase by 1 pill every week until on 2 bid.-1 week- 1 poqam; 2 week 1 pobid; 3 week 2 am 1 pm; 4 week 2 am, 2 pm (Patient not taking: Reported on 09/05/2014) 120 tablet 1  . phentermine 37.5 MG capsule Take 1 capsule (37.5 mg total) by mouth every morning. (Patient not taking: Reported on 09/05/2014) 30 capsule 2   No current facility-administered medications on file  prior to visit.   No Known Allergies History   Social History  . Marital Status: Single    Spouse Name: N/A    Number of Children: N/A  . Years of Education: N/A   Occupational History  . Not on file.   Social History Main Topics  . Smoking status: Never Smoker   . Smokeless tobacco: Not on file  . Alcohol Use: No  . Drug Use: Not on file  . Sexual Activity: Yes     Comment: divorced   Other Topics Concern  . Not on file   Social History Narrative   ** Merged History Encounter **          Review of Systems  All other systems reviewed and are negative.      Objective:   Physical Exam  Constitutional: She appears well-developed and well-nourished.  Cardiovascular: Normal rate, regular rhythm, normal heart sounds and intact distal pulses.  Exam reveals no gallop and no friction rub.   No murmur heard. Pulmonary/Chest: Breath sounds normal. No respiratory distress. She has no wheezes. She has no rales.  Abdominal: Soft. Bowel sounds are normal. She exhibits no distension. There is no tenderness. There is no rebound.  Musculoskeletal:       Left knee: She exhibits no swelling, no effusion and normal patellar mobility. Tenderness found. Patellar tendon tenderness noted.  Vitals  reviewed.         Assessment & Plan:  Essential hypertension - Plan: COMPLETE METABOLIC PANEL WITH GFR, Lipid panel, CBC with Differential  HLD (hyperlipidemia) - Plan: COMPLETE METABOLIC PANEL WITH GFR, Lipid panel  Knee pain, acute, left - Plan: diclofenac (VOLTAREN) 75 MG EC tablet  . Continue current medications. I will check a CMP and fasting lipid panel. Goal LDL cholesterol is less than 130. I will also start the patient on diclofenac 75 mg by mouth twice a day for her knee pain. Recheck in 2 weeks if no better. I recommended rest ice elevation and compression.

## 2014-09-09 ENCOUNTER — Encounter: Payer: Self-pay | Admitting: *Deleted

## 2014-11-05 ENCOUNTER — Telehealth: Payer: Self-pay | Admitting: Family Medicine

## 2014-11-05 NOTE — Telephone Encounter (Signed)
Patient would like to know if dr pickard will call in [place] for her if possible  [place]  [phone]

## 2014-11-05 NOTE — Telephone Encounter (Signed)
?   OK to send rx

## 2014-11-07 NOTE — Telephone Encounter (Signed)
LMTRC

## 2014-11-07 NOTE — Telephone Encounter (Signed)
Pt is not taking Contrave it was too expensive but would like to try the topamax. Need dose and sig.

## 2014-11-07 NOTE — Telephone Encounter (Signed)
Given her BP, I would not recommend prolonged use of qsymia due to phentermine.  We could try topamax alone if she would like to.  Is she using contrave?

## 2014-11-10 MED ORDER — TOPIRAMATE 25 MG PO CPSP: ORAL_CAPSULE | ORAL | Status: DC

## 2014-11-10 NOTE — Telephone Encounter (Signed)
Med sent to pharm and pt aware via vm 

## 2014-11-10 NOTE — Telephone Encounter (Signed)
Begin topamax 25 mg poqhs week 1 25 mg pobid week 2 25 mg poqam, 50 mg poqhs week 3 50 mg pobid week 4 and after

## 2014-12-18 ENCOUNTER — Encounter: Payer: Self-pay | Admitting: Family Medicine

## 2014-12-18 ENCOUNTER — Ambulatory Visit (INDEPENDENT_AMBULATORY_CARE_PROVIDER_SITE_OTHER): Payer: BLUE CROSS/BLUE SHIELD | Admitting: Family Medicine

## 2014-12-18 VITALS — BP 100/62 | HR 68 | Temp 98.0°F | Resp 14 | Ht 62.0 in | Wt 170.0 lb

## 2014-12-18 DIAGNOSIS — Z0000 Encounter for general adult medical examination without abnormal findings: Secondary | ICD-10-CM | POA: Diagnosis not present

## 2014-12-18 DIAGNOSIS — H60391 Other infective otitis externa, right ear: Secondary | ICD-10-CM

## 2014-12-18 LAB — CBC WITH DIFFERENTIAL/PLATELET
Basophils Absolute: 0 10*3/uL (ref 0.0–0.1)
Basophils Relative: 0 % (ref 0–1)
Eosinophils Absolute: 0.3 10*3/uL (ref 0.0–0.7)
Eosinophils Relative: 4 % (ref 0–5)
HEMATOCRIT: 39.4 % (ref 36.0–46.0)
HEMOGLOBIN: 13.4 g/dL (ref 12.0–15.0)
LYMPHS ABS: 3.4 10*3/uL (ref 0.7–4.0)
Lymphocytes Relative: 42 % (ref 12–46)
MCH: 30.8 pg (ref 26.0–34.0)
MCHC: 34 g/dL (ref 30.0–36.0)
MCV: 90.6 fL (ref 78.0–100.0)
MONO ABS: 0.4 10*3/uL (ref 0.1–1.0)
MPV: 9.7 fL (ref 8.6–12.4)
Monocytes Relative: 5 % (ref 3–12)
NEUTROS PCT: 49 % (ref 43–77)
Neutro Abs: 3.9 10*3/uL (ref 1.7–7.7)
Platelets: 289 10*3/uL (ref 150–400)
RBC: 4.35 MIL/uL (ref 3.87–5.11)
RDW: 14.5 % (ref 11.5–15.5)
WBC: 8 10*3/uL (ref 4.0–10.5)

## 2014-12-18 LAB — LIPID PANEL
Cholesterol: 272 mg/dL — ABNORMAL HIGH (ref 0–200)
HDL: 43 mg/dL — AB (ref >=46)
LDL Cholesterol: 205 mg/dL — ABNORMAL HIGH (ref 0–99)
TRIGLYCERIDES: 118 mg/dL (ref <150)
Total CHOL/HDL Ratio: 6.3 Ratio
VLDL: 24 mg/dL (ref 0–40)

## 2014-12-18 LAB — COMPLETE METABOLIC PANEL WITH GFR
ALBUMIN: 4.4 g/dL (ref 3.5–5.2)
ALT: 57 U/L — AB (ref 0–35)
AST: 49 U/L — ABNORMAL HIGH (ref 0–37)
Alkaline Phosphatase: 73 U/L (ref 39–117)
BUN: 6 mg/dL (ref 6–23)
CALCIUM: 9.4 mg/dL (ref 8.4–10.5)
CO2: 26 mEq/L (ref 19–32)
CREATININE: 0.63 mg/dL (ref 0.50–1.10)
Chloride: 103 mEq/L (ref 96–112)
GFR, Est African American: >89 mL/min
GFR, Est Non African American: >89 mL/min
Glucose, Bld: 110 mg/dL — ABNORMAL HIGH (ref 70–99)
Potassium: 4 mEq/L (ref 3.5–5.3)
Sodium: 140 mEq/L (ref 135–145)
Total Bilirubin: 0.4 mg/dL (ref 0.2–1.2)
Total Protein: 7.2 g/dL (ref 6.0–8.3)

## 2014-12-18 LAB — TSH: TSH: 1.283 u[IU]/mL (ref 0.350–4.500)

## 2014-12-18 MED ORDER — NEOMYCIN-POLYMYXIN-HC 3.5-10000-1 OT SOLN: 4.0000 [drp] | Freq: Four times a day (QID) | OTIC | Status: DC

## 2014-12-18 MED ORDER — LOSARTAN POTASSIUM 100 MG PO TABS: ORAL_TABLET | ORAL | Status: DC

## 2014-12-18 NOTE — Progress Notes (Signed)
Subjective:    Patient ID: Stephanie [name], female    DOB: [birth year], 49 y.o.   MRN: [id number]  HPI Patient is a very pleasant 49 year old white female who is here today for complete physical exam. Her mammogram was performed in January and was normal. She is due today for a Pap smear. She is also due today for annual screening lab work. Her blood pressure is outstanding at 100/62. She denies any chest pain shortness of breath or dyspnea on exertion. She does complain of some pain in her right ear. On examination today, the right auditory canal is completely obstructed with hard wax. Past Medical History  Diagnosis Date  . Hyperlipidemia   . Hypertension   . High cholesterol    Past Surgical History  Procedure Laterality Date  . Cholecystectomy    . Cesarean section     Current Outpatient Prescriptions on File Prior to Visit  Medication Sig Dispense Refill  . diclofenac (VOLTAREN) 75 MG EC tablet Take 1 tablet (75 mg total) by mouth 2 (two) times daily. 30 tablet 0  . meclizine (ANTIVERT) 25 MG tablet Take 1 tablet (25 mg total) by mouth 3 (three) times daily as needed for dizziness. 28 tablet 0  . simvastatin (ZOCOR) 40 MG tablet TAKE 1 TABLET BY MOUTH ONCE EVERY EVENING 30 tablet 3  . topiramate (TOPAMAX) 25 MG capsule 1 tab po qhs x 1 week; 1 tabs po bid week 2; 1 tab po qam-2 tab po qpm week 3; 2 tab po bid week 4 & thereafter. 120 capsule 2   No current facility-administered medications on file prior to visit.   No Known Allergies History   Social History  . Marital Status: Single    Spouse Name: N/A  . Number of Children: N/A  . Years of Education: N/A   Occupational History  . Not on file.   Social History Main Topics  . Smoking status: Never Smoker   . Smokeless tobacco: Not on file  . Alcohol Use: No  . Drug Use: Not on file  . Sexual Activity: Yes     Comment: divorced   Other Topics Concern  . Not on file   Social History Narrative   ** Merged  History Encounter **       Family History  Problem Relation Age of Onset  . Hypertension Mother   . Hyperlipidemia Mother   . Heart disease Father   . Hyperlipidemia Brother   . Hypertension Brother   . Cancer Maternal Grandmother     brain tumor  . Cancer Maternal Grandfather     retinoblastoma      Review of Systems  All other systems reviewed and are negative.      Objective:   Physical Exam  Constitutional: She is oriented to person, place, and time. She appears well-developed and well-nourished. No distress.  HENT:  Head: Normocephalic and atraumatic.  Right Ear: External ear normal.  Left Ear: External ear normal.  Nose: Nose normal.  Mouth/Throat: Oropharynx is clear and moist. No oropharyngeal exudate.  Eyes: Conjunctivae and EOM are normal. Pupils are equal, round, and reactive to light. Right eye exhibits no discharge. Left eye exhibits no discharge. No scleral icterus.  Neck: Normal range of motion. Neck supple. No JVD present. No tracheal deviation present. No thyromegaly present.  Cardiovascular: Normal rate, regular rhythm, normal heart sounds and intact distal pulses.  Exam reveals no gallop and no friction rub.   No murmur heard. Pulmonary/Chest:  Effort normal and breath sounds normal. No stridor. No respiratory distress. She has no wheezes. She has no rales. She exhibits no tenderness.  Abdominal: Soft. Bowel sounds are normal. She exhibits no distension and no mass. There is no tenderness. There is no rebound and no guarding.  Genitourinary: Vagina normal.  Musculoskeletal: Normal range of motion. She exhibits no edema or tenderness.  Lymphadenopathy:    She has no cervical adenopathy.  Neurological: She is alert and oriented to person, place, and time. She has normal reflexes. She displays normal reflexes. No cranial nerve deficit. She exhibits normal muscle tone. Coordination normal.  Skin: Skin is warm. No rash noted. She is not diaphoretic. No  erythema. No pallor.  Psychiatric: She has a normal mood and affect. Her behavior is normal. Judgment and thought content normal.  Vitals reviewed.         Assessment & Plan:  Routine general medical examination at a health care facility - Plan: CBC with Differential/Platelet, COMPLETE METABOLIC PANEL WITH GFR, Lipid panel, TSH, PAP, Thin Prep w/HPV rflx HPV Type 16/18  Patient's physical exam today was normal outside of weight. I did recommend that she decrease her losartan to 50 mg a day. I will check a CBC, CMP, fasting lipid panel, and TSH. Pap smear was sent to pathology. There was no cervical motion tenderness. There is no visible abnormalities in the vagina. Bimanual exam was not performed. Breast exam was performed today and was normal. Her mammogram was performed in January and was normal. There is no family history of colon cancer and therefore I think a colonoscopy can be deferred until age 15. Immunizations are up-to-date. We did remove the cerumen impaction from the right ear with irrigation and lavage.  After removing cerumen impaction, the right tympanic membrane did appear erythematous and irritated. I believe the patient has otitis externa possibly from the wax plug and possibly from using Q-tips. I will treat this with Cortisporin Otic 4 drops in the right ear 4 times a day for 1 week. Also recommended the discontinuation of Q-tips

## 2014-12-22 ENCOUNTER — Telehealth: Payer: Self-pay | Admitting: Family Medicine

## 2014-12-22 NOTE — Telephone Encounter (Signed)
Pt called about lab results and informed of results and pt was not taking her simvastatin - she will restart this and recheck in 3 months.

## 2014-12-23 LAB — PAP, THIN PREP W/HPV RFLX HPV TYPE 16/18: HPV DNA HIGH RISK: DETECTED — AB

## 2014-12-23 LAB — HPV TYPE 16/18
HPV Genotype, 16: DETECTED — AB
HPV Genotype, 18: NOT DETECTED

## 2014-12-31 ENCOUNTER — Ambulatory Visit (INDEPENDENT_AMBULATORY_CARE_PROVIDER_SITE_OTHER): Payer: BLUE CROSS/BLUE SHIELD | Admitting: Physician Assistant

## 2014-12-31 ENCOUNTER — Encounter: Payer: Self-pay | Admitting: Physician Assistant

## 2014-12-31 VITALS — BP 120/70 | HR 52 | Temp 98.1°F | Resp 18 | Wt 170.0 lb

## 2014-12-31 DIAGNOSIS — R888 Abnormal findings in other body fluids and substances: Secondary | ICD-10-CM

## 2014-12-31 DIAGNOSIS — B977 Papillomavirus as the cause of diseases classified elsewhere: Secondary | ICD-10-CM | POA: Diagnosis not present

## 2014-12-31 DIAGNOSIS — IMO0002 Reserved for concepts with insufficient information to code with codable children: Secondary | ICD-10-CM

## 2014-12-31 NOTE — Progress Notes (Signed)
Patient ID: Stephanie [name] MRN: [id number], DOB: 11-28-1965, 49 y.o. Date of Encounter: 12/31/2014, 1:08 PM    Chief Complaint:  Chief Complaint  Patient presents with  . discuss PAP results     HPI: 49 y.o. year old white female here to discuss her Pap results. Says that she had received a phone call that her Pap smear results were normal. However says that she got another phone call today stating that we had just gotten an additional part of the test back which showed that the HPV part was positive. She says that she had asked a lot of questions on the phone but still was extremely concerned so came in to further discuss. She states that she has never had any abnormal Pap smear results in the past.     Home Meds:   Outpatient Prescriptions Prior to Visit  Medication Sig Dispense Refill  . diclofenac (VOLTAREN) 75 MG EC tablet Take 1 tablet (75 mg total) by mouth 2 (two) times daily. 30 tablet 0  . losartan (COZAAR) 100 MG tablet TAKE ONE TABLET BY MOUTH EVERY DAY 30 tablet 11  . meclizine (ANTIVERT) 25 MG tablet Take 1 tablet (25 mg total) by mouth 3 (three) times daily as needed for dizziness. 28 tablet 0  . simvastatin (ZOCOR) 40 MG tablet TAKE 1 TABLET BY MOUTH ONCE EVERY EVENING 30 tablet 3  . topiramate (TOPAMAX) 25 MG capsule 1 tab po qhs x 1 week; 1 tabs po bid week 2; 1 tab po qam-2 tab po qpm week 3; 2 tab po bid week 4 & thereafter. 120 capsule 2  . neomycin-polymyxin-hydrocortisone (CORTISPORIN) otic solution Place 4 drops into the right ear 4 (four) times daily. 10 mL 0   No facility-administered medications prior to visit.    Allergies: No Known Allergies    Review of Systems: See HPI for pertinent ROS. All other ROS negative.    Physical Exam: Blood pressure 120/70, pulse 52, temperature 98.1 F (36.7 C), temperature source Oral, resp. rate 18, weight 170 lb (77.111 kg)., Body mass index is 31.09 kg/(m^2). General: WNWD WF.  Appears in no acute  distress.  Neck: Supple. No thyromegaly. No lymphadenopathy. Lungs: Clear bilaterally to auscultation without wheezes, rales, or rhonchi. Breathing is unlabored. Heart: Regular rhythm. No murmurs, rubs, or gallops. Msk:  Strength and tone normal for age. Extremities/Skin: Warm and dry.  Neuro: Alert and oriented X 3. Moves all extremities spontaneously. Gait is normal. CNII-XII grossly in tact. Psych:  Responds to questions appropriately with a normal affect.     ASSESSMENT AND PLAN:  49 y.o. year old female with    1. HPV test positive I explained to her that the cytology was normal. Explained that this meant that when cells were looked at under the microscope that these were all normal. I explained that HPV is a virus. Explained that most of the time, peoples bodies fight off this virus and it resolves on its own. (just like a cold so virus and your body fight it off). I explained that in a very small number of cases HPV can lead to cervical cancer. I reviewed at length the fact that her cells are normal at present and are showing no signs of change. I explained all pieces of information that I could come up with at all to answer all questions but still was in the room with her for a good 30 minutes with her continuing to ask questions and repeat the  information etc.       Signed, Olean Ree Pierz, [place], [facility] 12/31/2014 1:08 PM

## 2015-04-16 ENCOUNTER — Telehealth: Payer: Self-pay | Admitting: *Deleted

## 2015-04-16 NOTE — Telephone Encounter (Signed)
Ok with 3 months 

## 2015-04-16 NOTE — Telephone Encounter (Signed)
Pt called stating she would like to get a refill on her Phentermine, states she was on it before and it helped. Pt is getting married in October and would like to some weight have, pt says that she has been eating and dieting and watching her weight and needs to get a boost. Pt BP is fine.   Please call her to let her know when Rx is ready.  [place]

## 2015-04-17 MED ORDER — PHENTERMINE HCL 37.5 MG PO TABS: 37.5000 mg | ORAL_TABLET | Freq: Every day | ORAL | Status: DC

## 2015-04-17 NOTE — Telephone Encounter (Signed)
Script called in to [place]

## 2015-04-17 NOTE — Telephone Encounter (Signed)
Script printed ready for provider signature 

## 2015-05-13 ENCOUNTER — Ambulatory Visit (INDEPENDENT_AMBULATORY_CARE_PROVIDER_SITE_OTHER): Payer: BLUE CROSS/BLUE SHIELD | Admitting: Physician Assistant

## 2015-05-13 ENCOUNTER — Encounter: Payer: Self-pay | Admitting: Physician Assistant

## 2015-05-13 ENCOUNTER — Encounter: Payer: Self-pay | Admitting: Family Medicine

## 2015-05-13 VITALS — BP 120/72 | HR 78 | Temp 98.0°F | Resp 16 | Ht 62.0 in | Wt 170.0 lb

## 2015-05-13 DIAGNOSIS — H6693 Otitis media, unspecified, bilateral: Secondary | ICD-10-CM

## 2015-05-13 DIAGNOSIS — J069 Acute upper respiratory infection, unspecified: Secondary | ICD-10-CM

## 2015-05-13 MED ORDER — AMOXICILLIN-POT CLAVULANATE 875-125 MG PO TABS: 1.0000 | ORAL_TABLET | Freq: Two times a day (BID) | ORAL | Status: DC

## 2015-05-13 NOTE — Progress Notes (Signed)
Patient ID: Stephanie [name] MRN: [id number], DOB: July 25, 1966, 49 y.o. Date of Encounter: 05/13/2015, 5:26 PM    Chief Complaint:  Chief Complaint  Patient presents with  . Bilateral ear pain, swollen glands, ST     HPI: 49 y.o. year old white female presents with above. Says that for the past 3 days her ears have really been bothering her. Says that it got a lot worse last night and was having pain in both ears significantly last night. Says that every time she has a cold or problem with her sinuses she has trouble with her ears ". Says that she is getting little drainage from her nose and that is clear. Says throat has just been mildly sore/irritated no severe sore throat. No chest congestion. No fevers or chills.     Home Meds:   Outpatient Prescriptions Prior to Visit  Medication Sig Dispense Refill  . losartan (COZAAR) 100 MG tablet TAKE ONE TABLET BY MOUTH EVERY DAY (Patient taking differently: Take 50 mg by mouth daily. TAKE ONE TABLET BY MOUTH EVERY DAY) 30 tablet 11  . phentermine (ADIPEX-P) 37.5 MG tablet Take 1 tablet (37.5 mg total) by mouth daily before breakfast. 30 tablet 2  . simvastatin (ZOCOR) 40 MG tablet TAKE 1 TABLET BY MOUTH ONCE EVERY EVENING 30 tablet 3  . diclofenac (VOLTAREN) 75 MG EC tablet Take 1 tablet (75 mg total) by mouth 2 (two) times daily. 30 tablet 0  . meclizine (ANTIVERT) 25 MG tablet Take 1 tablet (25 mg total) by mouth 3 (three) times daily as needed for dizziness. 28 tablet 0  . topiramate (TOPAMAX) 25 MG capsule 1 tab po qhs x 1 week; 1 tabs po bid week 2; 1 tab po qam-2 tab po qpm week 3; 2 tab po bid week 4 & thereafter. 120 capsule 2   No facility-administered medications prior to visit.    Allergies: No Known Allergies    Review of Systems: See HPI for pertinent ROS. All other ROS negative.    Physical Exam: Blood pressure 120/72, pulse 78, temperature 98 F (36.7 C), temperature source Oral, resp. rate 16, height 5\' 2"  (1.575  m), weight 170 lb (77.111 kg)., Body mass index is 31.09 kg/(m^2). General: WNWD WF.  Appears in no acute distress. HEENT: Normocephalic, atraumatic, eyes without discharge, sclera non-icteric, nares are without discharge. Bilateral auditory canals clear, TM's are without perforation.TMs are slightly dull, slightly retracted. Oral cavity moist, posterior pharynx with minimal erythema,  no exudate, no peritonsillar abscess. No tenderness with percussion of frontal or maxillary sinuses bilaterally. Also palpated TM joints bilaterally but she reports no significant tenderness of those joints. Neck: Supple. No thyromegaly. No lymphadenopathy. Lungs: Clear bilaterally to auscultation without wheezes, rales, or rhonchi. Breathing is unlabored. Heart: Regular rhythm. No murmurs, rubs, or gallops. Msk:  Strength and tone normal for age. Extremities/Skin: Warm and dry.  No rashes. Neuro: Alert and oriented X 3. Moves all extremities spontaneously. Gait is normal. CNII-XII grossly in tact. Psych:  Responds to questions appropriately with a normal affect.     ASSESSMENT AND PLAN:  49 y.o. year old female with  1. Bilateral acute otitis media, recurrence not specified, unspecified otitis media type - amoxicillin-clavulanate (AUGMENTIN) 875-125 MG per tablet; Take 1 tablet by mouth 2 (two) times daily.  Dispense: 14 tablet; Refill: 0  2. Acute upper respiratory infection - amoxicillin-clavulanate (AUGMENTIN) 875-125 MG per tablet; Take 1 tablet by mouth 2 (two) times daily.  Dispense: 14  tablet; Refill: 0  Exam of her ears really does not look that bad, but given the symptoms that she is re-reporting will go ahead and treat with Augmentin. Told her to also use Tylenol and Motrin for pain control especially prior to going to bed as pressure behind the ears increases at that time and causes increased pain. Follow-up if symptoms do not resolve upon completion of antibiotic.  Marin Olp Hidalgo, [place],  [facility] 05/13/2015 5:26 PM

## 2015-05-14 ENCOUNTER — Encounter: Payer: Self-pay | Admitting: Family Medicine

## 2015-05-29 ENCOUNTER — Telehealth: Payer: Self-pay | Admitting: Family Medicine

## 2015-05-29 ENCOUNTER — Encounter: Payer: Self-pay | Admitting: Family Medicine

## 2015-05-29 ENCOUNTER — Ambulatory Visit (INDEPENDENT_AMBULATORY_CARE_PROVIDER_SITE_OTHER): Payer: BLUE CROSS/BLUE SHIELD | Admitting: Family Medicine

## 2015-05-29 VITALS — BP 126/76 | HR 72 | Temp 98.5°F | Resp 16 | Wt 170.0 lb

## 2015-05-29 DIAGNOSIS — J0100 Acute maxillary sinusitis, unspecified: Secondary | ICD-10-CM | POA: Diagnosis not present

## 2015-05-29 DIAGNOSIS — I889 Nonspecific lymphadenitis, unspecified: Secondary | ICD-10-CM | POA: Diagnosis not present

## 2015-05-29 MED ORDER — CEFDINIR 300 MG PO CAPS: 300.0000 mg | ORAL_CAPSULE | Freq: Two times a day (BID) | ORAL | Status: DC

## 2015-05-29 NOTE — Progress Notes (Signed)
Subjective:    Patient ID: Stephanie [name], female    DOB: Sep 28, 1965, 49 y.o.   MRN: [id number]  HPI Saw MBD 9/21 and was diagnosed with bilateral ear infections and placed on augmentin.  The pain in her ears completely went away. She completed the anabolic saw on October 1. She was pain-free for approximately 5 days. Suddenly in the last 24 hours she developed bilateral ear pain. She has tremendous sinus congestion. She reports rhinorrhea. She reports sore throat. She has moderate tender lymphadenopathy left greater than right. There is a large, 1.5 cm swollen tender left anterior cervical lymph node which is exquisitely tender to touch.   Past Medical History  Diagnosis Date  . Hyperlipidemia   . Hypertension   . High cholesterol    Past Surgical History  Procedure Laterality Date  . Cholecystectomy    . Cesarean section     Current Outpatient Prescriptions on File Prior to Visit  Medication Sig Dispense Refill  . losartan (COZAAR) 100 MG tablet TAKE ONE TABLET BY MOUTH EVERY DAY (Patient taking differently: Take 50 mg by mouth daily. TAKE ONE TABLET BY MOUTH EVERY DAY) 30 tablet 11  . phentermine (ADIPEX-P) 37.5 MG tablet Take 1 tablet (37.5 mg total) by mouth daily before breakfast. 30 tablet 2  . simvastatin (ZOCOR) 40 MG tablet TAKE 1 TABLET BY MOUTH ONCE EVERY EVENING 30 tablet 3   No current facility-administered medications on file prior to visit.   No Known Allergies Social History   Social History  . Marital Status: Single    Spouse Name: N/A  . Number of Children: N/A  . Years of Education: N/A   Occupational History  . Not on file.   Social History Main Topics  . Smoking status: Never Smoker   . Smokeless tobacco: Not on file  . Alcohol Use: No  . Drug Use: Not on file  . Sexual Activity: Yes     Comment: divorced   Other Topics Concern  . Not on file   Social History Narrative   ** Merged History Encounter **          Review of Systems  All  other systems reviewed and are negative.      Objective:   Physical Exam  HENT:  Head: Normocephalic and atraumatic.  Right Ear: Tympanic membrane, external ear and ear canal normal.  Left Ear: Tympanic membrane, external ear and ear canal normal.  Nose: Mucosal edema and rhinorrhea present. Right sinus exhibits maxillary sinus tenderness and frontal sinus tenderness. Left sinus exhibits maxillary sinus tenderness and frontal sinus tenderness.  Mouth/Throat: Oropharynx is clear and moist. No oropharyngeal exudate, posterior oropharyngeal edema, posterior oropharyngeal erythema or tonsillar abscesses.  Eyes: Conjunctivae are normal.  Neck: Neck supple. No thyromegaly present.  Cardiovascular: Normal rate, regular rhythm and normal heart sounds.   No murmur heard. Pulmonary/Chest: Effort normal and breath sounds normal. No respiratory distress. She has no wheezes. She has no rales.  Lymphadenopathy:    She has cervical adenopathy.  Vitals reviewed.         Assessment & Plan:  Lymphadenitis - Plan: cefdinir (OMNICEF) 300 MG capsule  Acute maxillary sinusitis, recurrence not specified I believe this patient has a viral upper respiratory infection/sinus infection that has occluded her eustachian tubes and is causing her ear pain. Today on exam the tympanic membranes appear completely normal and there is no evidence of an ear infection. Therefore I recommended Sudafed, Zyrtec, and Flonase to help  open her sinus passages and facilitate with her symptoms. I've also recommended ibuprofen 800 mg every 8 hours as needed for headache swelling and pain. I am concerned  By the large tender left anterior cervical lymph node which is more consistent with lymphadenitis given the tenderness on examination. Therefore I will cover the patient with Omnicef 300 mg by mouth twice a day for 10 days. Recheck next week or sooner if worse

## 2015-05-29 NOTE — Telephone Encounter (Signed)
[facility]   Patient was here recently to see mb for ear infection, now is having more ear pain would like to know if something else can be called in  [phone]

## 2015-05-29 NOTE — Telephone Encounter (Signed)
That was 2 weeks ago and she was given augmentin.  If it is not better she needs to be seen.

## 2015-05-29 NOTE — Telephone Encounter (Signed)
appt made for today 

## 2015-06-15 ENCOUNTER — Encounter: Payer: Self-pay | Admitting: Family Medicine

## 2015-06-16 ENCOUNTER — Ambulatory Visit (INDEPENDENT_AMBULATORY_CARE_PROVIDER_SITE_OTHER): Payer: BLUE CROSS/BLUE SHIELD | Admitting: Family Medicine

## 2015-06-16 ENCOUNTER — Telehealth: Payer: Self-pay | Admitting: *Deleted

## 2015-06-16 ENCOUNTER — Encounter: Payer: Self-pay | Admitting: Family Medicine

## 2015-06-16 VITALS — BP 126/74 | HR 70 | Temp 97.9°F | Resp 18 | Ht 62.0 in | Wt 171.0 lb

## 2015-06-16 DIAGNOSIS — R590 Localized enlarged lymph nodes: Secondary | ICD-10-CM | POA: Diagnosis not present

## 2015-06-16 DIAGNOSIS — J209 Acute bronchitis, unspecified: Secondary | ICD-10-CM | POA: Diagnosis not present

## 2015-06-16 LAB — RAPID STREP SCREEN (MED CTR MEBANE ONLY): Streptococcus, Group A Screen (Direct): NEGATIVE

## 2015-06-16 MED ORDER — PROMETHAZINE-CODEINE 6.25-10 MG/5ML PO SYRP: 5.0000 mL | ORAL_SOLUTION | Freq: Four times a day (QID) | ORAL | Status: DC | PRN

## 2015-06-16 MED ORDER — METHYLPREDNISOLONE ACETATE 80 MG/ML IJ SUSP
40.0000 mg | Freq: Once | INTRAMUSCULAR | Status: AC
  Administered 2015-06-16: 40 mg via INTRAMUSCULAR

## 2015-06-16 NOTE — Telephone Encounter (Signed)
Pt called stating wanting to let provider know that her ears are better but her cough is still lingering, states she has taken 2 rounds of antibiotic and OTC Delsym cough medication and is not touching it, wants to know if you could call something else in that has codeine in to help relieve her some esp at night.  Call back number [phone], can leave detailed message  [place]

## 2015-06-16 NOTE — Telephone Encounter (Signed)
Hycodan 1 tsp poq6 hrs prn cough.

## 2015-06-16 NOTE — Patient Instructions (Signed)
Take cough medicine as prescribed [place] of neck to be set up We will call with lab results F/U pending results

## 2015-06-16 NOTE — Telephone Encounter (Signed)
Pt was in office saw Dr. Buelah Manis which prescribed her cough medicine with codeine

## 2015-06-16 NOTE — Telephone Encounter (Signed)
Pt coming in for ov today at 3pm with Dr. Buelah Manis, will be addressed at that time

## 2015-06-16 NOTE — Progress Notes (Signed)
Patient ID: Stephanie [name], female   DOB: 1966/05/30, 49 y.o.   MRN: [id number]   Subjective:    Patient ID: Stephanie [name], female    DOB: Apr 08, 1966, 49 y.o.   MRN: [id number]  Patient presents for Illness  patient here with persistent cough and swelling of her lymph nodes. She was seen on September 21 at that time had bilateral ear infection she was prescribed Augmentin. She will return a couple weeks later as her symptoms improved some but then came back with sore throat and swollen lymph node she was given Omnicef. She also developed cough a few weeks ago which has been worsening. At time she feels like she is getting choked on her food because of the swelling. She has not had any fever. She has had laryngitis. She's been taking Delsym for cough. Note she did recently return from [place].    Review Of Systems: per above   GEN- denies fatigue, fever, weight loss,weakness, recent illness HEENT- denies eye drainage, change in vision, nasal discharge, CVS- denies chest pain, palpitations RESP- denies SOB, +cough, wheeze ABD- denies N/V, change in stools, abd pain Neuro- denies headache, dizziness, syncope, seizure activity       Objective:    BP 126/74 mmHg  Pulse 70  Temp(Src) 97.9 F (36.6 C) (Oral)  Resp 18  Ht 5\' 2"  (1.575 m)  Wt 171 lb (77.565 kg)  BMI 31.27 kg/m2  SpO2 98% GEN- NAD, alert and oriented x3 HEENT- PERRL, EOMI, non injected sclera, pink conjunctiva, MMM, oropharynx clear Neck- Supple, swelling of anterior cervical region, difficult to discrene if thyroid enlarged or just lymph nodes, nodes TTP Right side, TM clear no effusion, canals clear  CVS- RRR, no murmur RESP-CTAB EXT- No edema Pulses- Radial 2+        Assessment & Plan:      Problem List Items Addressed This Visit    None    Visit Diagnoses    Cervical lymphadenopathy    -  Primary    r/o enlarged thyroid as well as reactive nodes or infectious node    Relevant Orders    Rapid  strep screen (not at [facility]) (Completed)    CBC with Differential    Sedimentation Rate    US Soft Tissue Head/Neck    Acute bronchitis, unspecified organism        Treat with codiene, Depo Medrol given with the swelling in lymph area that has not responded to antibiotics, [place] of neck to be done    Relevant Medications    methylPREDNISolone acetate (DEPO-MEDROL) injection 40 mg (Completed)       Note: This dictation was prepared with Dragon dictation along with smaller phrase technology. Any transcriptional errors that result from this process are unintentional.

## 2015-06-17 ENCOUNTER — Telehealth: Payer: Self-pay | Admitting: Family Medicine

## 2015-06-17 ENCOUNTER — Ambulatory Visit
Admission: RE | Admit: 2015-06-17 | Discharge: 2015-06-17 | Disposition: A | Discharge status: Home / Self Care | Payer: BLUE CROSS/BLUE SHIELD | Source: Ambulatory Visit | Attending: Family Medicine | Admitting: Family Medicine

## 2015-06-17 DIAGNOSIS — R590 Localized enlarged lymph nodes: Secondary | ICD-10-CM

## 2015-06-17 MED ORDER — PREDNISONE 20 MG PO TABS: ORAL_TABLET | ORAL | Status: DC

## 2015-06-17 NOTE — Telephone Encounter (Signed)
Spoke to pt that ultrasound results. She is still having cough and laryngitis which I can hear the phone however she did improve somewhat with the steroid yesterday so on [place] send her over to prednisone bursts. 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 If she is still not improving by Friday I will extend her work note through Friday.

## 2015-07-24 ENCOUNTER — Ambulatory Visit (INDEPENDENT_AMBULATORY_CARE_PROVIDER_SITE_OTHER): Payer: Self-pay | Admitting: Family Medicine

## 2015-07-24 ENCOUNTER — Encounter: Payer: Self-pay | Admitting: Family Medicine

## 2015-07-24 VITALS — BP 120/80 | HR 84 | Temp 98.5°F | Resp 18 | Ht 62.0 in | Wt 170.0 lb

## 2015-07-24 DIAGNOSIS — J320 Chronic maxillary sinusitis: Secondary | ICD-10-CM

## 2015-07-24 DIAGNOSIS — R590 Localized enlarged lymph nodes: Secondary | ICD-10-CM

## 2015-07-24 MED ORDER — PREDNISONE 20 MG PO TABS: ORAL_TABLET | ORAL | Status: DC

## 2015-07-24 MED ORDER — CEFDINIR 300 MG PO CAPS: 300.0000 mg | ORAL_CAPSULE | Freq: Two times a day (BID) | ORAL | Status: DC

## 2015-07-24 MED ORDER — PREDNISONE 20 MG PO TABS
ORAL_TABLET | ORAL | Status: DC
Start: 2015-07-24 — End: 2016-05-25

## 2015-07-24 NOTE — Progress Notes (Signed)
Subjective:    Patient ID: Stephanie [name], female    DOB: 06-Oct-1965, 49 y.o.   MRN: [id number]  HPI Patient has been seen several times since September for recurrent pain in her right ear, sinus pain in her right maxillary sinus, eustachian tube dysfunction, and recurrent lymphadenopathy in the right anterior cervical chain. She had an ultrasound of her neck performed in October by my partner which revealed small anterior cervical lymph nodes but they did not appear abnormal. Her symptoms have never completely resolved. She intermittently has pain in her right ear and a popping sound. She describes the pain as though she were going up in an airplane and it will not release. Today she reports pressure and swelling around her right eye and in her right maxillary sinus..  She does have a tender right submandibular lymph node. Past Medical History  Diagnosis Date  . Hyperlipidemia   . Hypertension   . High cholesterol    Past Surgical History  Procedure Laterality Date  . Cholecystectomy    . Cesarean section     Current Outpatient Prescriptions on File Prior to Visit  Medication Sig Dispense Refill  . losartan (COZAAR) 100 MG tablet TAKE ONE TABLET BY MOUTH EVERY DAY (Patient taking differently: Take 50 mg by mouth daily. TAKE ONE TABLET BY MOUTH EVERY DAY) 30 tablet 11  . simvastatin (ZOCOR) 40 MG tablet TAKE 1 TABLET BY MOUTH ONCE EVERY EVENING 30 tablet 3   No current facility-administered medications on file prior to visit.   No Known Allergies Social History   Social History  . Marital Status: Married    Spouse Name: N/A  . Number of Children: N/A  . Years of Education: N/A   Occupational History  . Not on file.   Social History Main Topics  . Smoking status: Never Smoker   . Smokeless tobacco: Not on file  . Alcohol Use: No  . Drug Use: Not on file  . Sexual Activity: Yes     Comment: divorced   Other Topics Concern  . Not on file   Social History Narrative   ** Merged History Encounter **          Review of Systems  All other systems reviewed and are negative.      Objective:   Physical Exam  Constitutional: She appears well-developed and well-nourished. No distress.  HENT:  Right Ear: External ear normal.  Left Ear: External ear normal.  Nose: Mucosal edema and rhinorrhea present. Right sinus exhibits maxillary sinus tenderness and frontal sinus tenderness.  Mouth/Throat: Oropharynx is clear and moist. No oropharyngeal exudate.  Eyes: Conjunctivae are normal. Right eye exhibits no discharge. Left eye exhibits no discharge.  Neck: Neck supple. No thyromegaly present.  Cardiovascular: Normal rate, regular rhythm and normal heart sounds.  Exam reveals no gallop.   No murmur heard. Pulmonary/Chest: Effort normal and breath sounds normal.  Lymphadenopathy:    She has cervical adenopathy.  Skin: She is not diaphoretic.  Vitals reviewed.         Assessment & Plan:  Chronic maxillary sinusitis - Plan: cefdinir (OMNICEF) 300 MG capsule, predniSONE (DELTASONE) 20 MG tablet, DISCONTINUED: cefdinir (OMNICEF) 300 MG capsule, DISCONTINUED: predniSONE (DELTASONE) 20 MG tablet  Cervical lymphadenopathy - Plan: cefdinir (OMNICEF) 300 MG capsule, predniSONE (DELTASONE) 20 MG tablet, DISCONTINUED: cefdinir (OMNICEF) 300 MG capsule, DISCONTINUED: predniSONE (DELTASONE) 20 MG tablet  I believe the patient is having recurrent right ear pain due to eustachian tube dysfunction due  to a chronic smoldering right maxillary sinusitis that has been present since September. There is no abnormality today seen on examination of her ear. I believe the same sinus infection could be triggering the recurrent reactive cervical lymphadenopathy. I will treat the patient with Omnicef 300 mg by mouth for 10 days and a prednisone taper pack. If she is not 100% better by next week, I want to obtain a CT scan of the sinuses to evaluate further and possibly an ENT  consultation

## 2015-08-04 ENCOUNTER — Telehealth: Payer: Self-pay | Admitting: Family Medicine

## 2015-08-04 DIAGNOSIS — J320 Chronic maxillary sinusitis: Secondary | ICD-10-CM

## 2015-08-04 NOTE — Telephone Encounter (Signed)
Patient is calling to speak to you regarding her ear  [phone]

## 2015-08-04 NOTE — Telephone Encounter (Signed)
Pt is stilling c/o ear pain and lymph node swollen in neck. Per [facility] CT scan or ENT referral or both. Discussed with pt and she opted to do ENT referral. Will order.

## 2015-08-04 NOTE — Telephone Encounter (Signed)
LMTRC

## 2015-08-06 ENCOUNTER — Telehealth: Payer: Self-pay | Admitting: Family Medicine

## 2015-08-06 MED ORDER — AMOXICILLIN-POT CLAVULANATE 875-125 MG PO TABS: 1.0000 | ORAL_TABLET | Freq: Two times a day (BID) | ORAL | Status: DC

## 2015-08-06 NOTE — Telephone Encounter (Signed)
Sinusitis has flared up again.  Still waiting on ENT appt.  Can you, please, call in another round of antibiotic and steroids?

## 2015-08-06 NOTE — Telephone Encounter (Signed)
Ok with augmentin 875 bid for 10 days.  Would not recommend repeated steroids until seen by ENT.

## 2015-08-06 NOTE — Telephone Encounter (Signed)
Pt aware of Rx and provider recommendations

## 2015-08-23 HISTORY — PX: NASAL SINUS SURGERY: SHX719

## 2015-09-07 ENCOUNTER — Other Ambulatory Visit: Payer: Self-pay

## 2015-09-07 DIAGNOSIS — Z1231 Encounter for screening mammogram for malignant neoplasm of breast: Secondary | ICD-10-CM

## 2015-09-28 ENCOUNTER — Ambulatory Visit
Admission: RE | Admit: 2015-09-28 | Discharge: 2015-09-28 | Disposition: A | Discharge status: Home / Self Care | Payer: BLUE CROSS/BLUE SHIELD | Source: Ambulatory Visit

## 2015-09-28 DIAGNOSIS — Z1231 Encounter for screening mammogram for malignant neoplasm of breast: Secondary | ICD-10-CM

## 2015-10-01 ENCOUNTER — Other Ambulatory Visit: Payer: Self-pay | Admitting: Family Medicine

## 2015-10-01 DIAGNOSIS — R928 Other abnormal and inconclusive findings on diagnostic imaging of breast: Secondary | ICD-10-CM

## 2015-10-05 ENCOUNTER — Ambulatory Visit
Admission: RE | Admit: 2015-10-05 | Discharge: 2015-10-05 | Disposition: A | Discharge status: Home / Self Care | Payer: PRIVATE HEALTH INSURANCE | Source: Ambulatory Visit | Attending: Family Medicine | Admitting: Family Medicine

## 2015-10-05 DIAGNOSIS — R928 Other abnormal and inconclusive findings on diagnostic imaging of breast: Secondary | ICD-10-CM

## 2016-02-09 ENCOUNTER — Telehealth: Payer: Self-pay | Admitting: Family Medicine

## 2016-02-09 ENCOUNTER — Encounter: Payer: Self-pay | Admitting: Family Medicine

## 2016-02-09 MED ORDER — SIMVASTATIN 40 MG PO TABS: ORAL_TABLET | ORAL | Status: DC

## 2016-02-09 MED ORDER — LOSARTAN POTASSIUM 100 MG PO TABS: ORAL_TABLET | ORAL | Status: DC

## 2016-02-09 NOTE — Telephone Encounter (Signed)
Medication called/sent to requested pharmacy  - and pt aware need ov and labs

## 2016-02-09 NOTE — Telephone Encounter (Signed)
[phone] [place] [facility]  Patients insurance runs out in July and wants to know if you can refill her simvastatin and losartan if possible, or does she need to come in?

## 2016-05-25 ENCOUNTER — Encounter: Payer: Self-pay | Admitting: Family Medicine

## 2016-05-25 ENCOUNTER — Ambulatory Visit (INDEPENDENT_AMBULATORY_CARE_PROVIDER_SITE_OTHER): Payer: Self-pay | Admitting: Family Medicine

## 2016-05-25 VITALS — BP 126/82 | HR 82 | Temp 97.9°F | Resp 16 | Ht 62.0 in | Wt 164.0 lb

## 2016-05-25 DIAGNOSIS — J320 Chronic maxillary sinusitis: Secondary | ICD-10-CM

## 2016-05-25 MED ORDER — CEFDINIR 300 MG PO CAPS: 300.0000 mg | ORAL_CAPSULE | Freq: Two times a day (BID) | ORAL | 0 refills | Status: DC

## 2016-05-25 MED ORDER — FLUCONAZOLE 150 MG PO TABS: 150.0000 mg | ORAL_TABLET | Freq: Once | ORAL | 0 refills | Status: AC

## 2016-05-25 MED ORDER — METHYLPREDNISOLONE ACETATE 40 MG/ML IJ SUSP
40.0000 mg | Freq: Once | INTRAMUSCULAR | Status: AC
  Administered 2016-05-25: 40 mg via INTRAMUSCULAR

## 2016-05-25 NOTE — Patient Instructions (Addendum)
Claritin Take antibiotics as prescribed Nasal saline  Diflucan as needed F/U Dec Physical/labs

## 2016-05-25 NOTE — Progress Notes (Signed)
   Subjective:    Patient ID: Stephanie [name], female    DOB: 27-Aug-1965, 50 y.o.   MRN: [id number]  Patient presents for Sinus infection? (Sinus congestion, facial pain, chest congestion, cough, ear pain, HA)    50 year old patient with history of chronic sinusitis. She was seen by ear nose and throat Dr. Janace Hoard with [facility].  Status post sinus surgery back in January. Past 2 weeks she's had sinus pressure swelling over the maxillary area. Pressure similar to her previous infections. She has only had one other infection which was directly after her surgery. She's not had any fever. She's been using Sudafed to decongest. She also started Claritin once a day. She is not using any nasal saline or steroids. He does get some cough when she lays down  Note she has not taking her cholesterol medicine as prescribed 6 that she will start taking daily follow-up in December for a recheck   Review Of Systems:  GEN- denies fatigue, fever, weight loss,weakness, recent illness HEENT- denies eye drainage, change in vision, +nasal discharge, CVS- denies chest pain, palpitations RESP- denies SOB, +cough, wheeze ABD- denies N/V, change in stools, abd pain Neuro- denies headache, dizziness, syncope, seizure activity       Objective:    BP 126/82   Pulse 82   Temp 97.9 F (36.6 C) (Oral)   Resp 16   Ht 5\' 2"  (1.575 m)   Wt 164 lb (74.4 kg)   BMI 30.00 kg/m  GEN- NAD, alert and oriented x3 HEENT- PERRL, EOMI, non injected sclera, pink conjunctiva, MMM, oropharynx clear, TM clear bilat no effusion,  + maxillary sinus tenderness, + Nasal drainage , m ild swelling in maxillary sinus region Neck- Supple, shotty ant  LAD CVS- RRR, no murmur RESP-CTAB EXT- No edema Pulses- Radial 2+         Assessment & Plan:      Problem List Items Addressed This Visit    None    Visit Diagnoses    Chronic maxillary sinusitis    -  Primary   acute on chronic sinusitis. also with underlying allergies.  continue claritin, add omnicef, depo medrol injection for inflammation has worked for her in past. Nasal saline rinse   Relevant Medications   cefdinir (OMNICEF) 300 MG capsule   fluconazole (DIFLUCAN) 150 MG tablet   methylPREDNISolone acetate (DEPO-MEDROL) injection 40 mg (Completed)      Note: This dictation was prepared with Dragon dictation along with smaller phrase technology. Any transcriptional errors that result from this process are unintentional.

## 2016-06-17 ENCOUNTER — Encounter: Payer: Self-pay | Admitting: Family Medicine

## 2016-07-12 ENCOUNTER — Encounter: Payer: Self-pay | Admitting: Family Medicine

## 2016-07-12 ENCOUNTER — Other Ambulatory Visit: Payer: Self-pay | Admitting: *Deleted

## 2016-07-12 DIAGNOSIS — E7849 Other hyperlipidemia: Secondary | ICD-10-CM

## 2016-07-12 DIAGNOSIS — I10 Essential (primary) hypertension: Secondary | ICD-10-CM

## 2016-07-18 ENCOUNTER — Other Ambulatory Visit: Payer: Managed Care, Other (non HMO)

## 2016-07-18 DIAGNOSIS — I10 Essential (primary) hypertension: Secondary | ICD-10-CM

## 2016-07-18 DIAGNOSIS — E7849 Other hyperlipidemia: Secondary | ICD-10-CM

## 2016-07-18 LAB — LIPID PANEL
CHOL/HDL RATIO: 4.3 ratio (ref <5.0)
CHOLESTEROL: 185 mg/dL (ref <200)
HDL: 43 mg/dL — AB (ref >50)
LDL Cholesterol: 123 mg/dL — ABNORMAL HIGH (ref <100)
TRIGLYCERIDES: 95 mg/dL (ref <150)
VLDL: 19 mg/dL (ref <30)

## 2016-07-18 LAB — COMPREHENSIVE METABOLIC PANEL
ALBUMIN: 4 g/dL (ref 3.6–5.1)
ALT: 23 U/L (ref 6–29)
AST: 22 U/L (ref 10–35)
Alkaline Phosphatase: 62 U/L (ref 33–130)
BUN: 10 mg/dL (ref 7–25)
CALCIUM: 8.8 mg/dL (ref 8.6–10.4)
CHLORIDE: 106 mmol/L (ref 98–110)
CO2: 27 mmol/L (ref 20–31)
Creat: 0.68 mg/dL (ref 0.50–1.05)
Glucose, Bld: 93 mg/dL (ref 70–99)
POTASSIUM: 4.3 mmol/L (ref 3.5–5.3)
Sodium: 141 mmol/L (ref 135–146)
TOTAL PROTEIN: 6.9 g/dL (ref 6.1–8.1)
Total Bilirubin: 0.3 mg/dL (ref 0.2–1.2)

## 2016-07-18 LAB — CBC WITH DIFFERENTIAL/PLATELET
BASOS ABS: 0 {cells}/uL (ref 0–200)
Basophils Relative: 0 %
EOS ABS: 304 {cells}/uL (ref 15–500)
Eosinophils Relative: 4 %
HEMATOCRIT: 39.6 % (ref 35.0–45.0)
Hemoglobin: 13.3 g/dL (ref 12.0–15.0)
Lymphocytes Relative: 44 %
Lymphs Abs: 3344 cells/uL (ref 850–3900)
MCH: 30.9 pg (ref 27.0–33.0)
MCHC: 33.6 g/dL (ref 32.0–36.0)
MCV: 91.9 fL (ref 80.0–100.0)
MONOS PCT: 5 %
MPV: 10 fL (ref 7.5–12.5)
Monocytes Absolute: 380 cells/uL (ref 200–950)
NEUTROS ABS: 3572 {cells}/uL (ref 1500–7800)
Neutrophils Relative %: 47 %
PLATELETS: 263 10*3/uL (ref 140–400)
RBC: 4.31 MIL/uL (ref 3.80–5.10)
RDW: 13.9 % (ref 11.0–15.0)
WBC: 7.6 10*3/uL (ref 3.8–10.8)

## 2016-07-19 ENCOUNTER — Encounter: Payer: Self-pay | Admitting: Family Medicine

## 2016-07-27 ENCOUNTER — Encounter: Payer: Self-pay | Admitting: Family Medicine

## 2016-07-27 MED ORDER — SIMVASTATIN 40 MG PO TABS: ORAL_TABLET | ORAL | 3 refills | Status: DC

## 2016-08-06 ENCOUNTER — Ambulatory Visit (INDEPENDENT_AMBULATORY_CARE_PROVIDER_SITE_OTHER): Payer: Managed Care, Other (non HMO) | Admitting: Family Medicine

## 2016-08-06 ENCOUNTER — Ambulatory Visit (INDEPENDENT_AMBULATORY_CARE_PROVIDER_SITE_OTHER): Payer: Managed Care, Other (non HMO)

## 2016-08-06 VITALS — BP 138/90 | HR 74 | Temp 98.1°F | Resp 16 | Ht 62.0 in | Wt 168.8 lb

## 2016-08-06 DIAGNOSIS — M436 Torticollis: Secondary | ICD-10-CM | POA: Diagnosis not present

## 2016-08-06 DIAGNOSIS — R0981 Nasal congestion: Secondary | ICD-10-CM

## 2016-08-06 DIAGNOSIS — R51 Headache: Secondary | ICD-10-CM | POA: Diagnosis not present

## 2016-08-06 DIAGNOSIS — R42 Dizziness and giddiness: Secondary | ICD-10-CM | POA: Diagnosis not present

## 2016-08-06 DIAGNOSIS — N390 Urinary tract infection, site not specified: Secondary | ICD-10-CM | POA: Diagnosis not present

## 2016-08-06 DIAGNOSIS — R519 Headache, unspecified: Secondary | ICD-10-CM

## 2016-08-06 LAB — POCT URINALYSIS DIP (MANUAL ENTRY)
BILIRUBIN UA: NEGATIVE
BILIRUBIN UA: NEGATIVE
Glucose, UA: NEGATIVE
Leukocytes, UA: NEGATIVE
Nitrite, UA: NEGATIVE
PH UA: 7
PROTEIN UA: NEGATIVE
Spec Grav, UA: 1.02
Urobilinogen, UA: 0.2

## 2016-08-06 LAB — POCT CBC
Granulocyte percent: 67.4 %G (ref 37–80)
HEMATOCRIT: 40 % (ref 37.7–47.9)
HEMOGLOBIN: 14.4 g/dL (ref 12.2–16.2)
LYMPH, POC: 2.8 (ref 0.6–3.4)
MCH: 31.8 pg — AB (ref 27–31.2)
MCHC: 35.9 g/dL — AB (ref 31.8–35.4)
MCV: 88.5 fL (ref 80–97)
MID (cbc): 0.4 (ref 0–0.9)
MPV: 7.6 fL (ref 0–99.8)
POC GRANULOCYTE: 6.7 (ref 2–6.9)
POC LYMPH PERCENT: 28.4 %L (ref 10–50)
POC MID %: 4.2 % (ref 0–12)
Platelet Count, POC: 276 10*3/uL (ref 142–424)
RBC: 4.52 M/uL (ref 4.04–5.48)
RDW, POC: 12.9 %
WBC: 9.9 10*3/uL (ref 4.6–10.2)

## 2016-08-06 LAB — POCT GLYCOSYLATED HEMOGLOBIN (HGB A1C): Hemoglobin A1C: 5.8

## 2016-08-06 LAB — POC MICROSCOPIC URINALYSIS (UMFC): Mucus: ABSENT

## 2016-08-06 LAB — GLUCOSE, POCT (MANUAL RESULT ENTRY): POC Glucose: 103 mg/dl — AB (ref 70–99)

## 2016-08-06 MED ORDER — CEFDINIR 300 MG PO CAPS: 300.0000 mg | ORAL_CAPSULE | Freq: Two times a day (BID) | ORAL | 0 refills | Status: DC

## 2016-08-06 MED ORDER — DIPHENHYDRAMINE HCL 50 MG/ML IJ SOLN
25.0000 mg | Freq: Once | INTRAMUSCULAR | Status: AC
  Administered 2016-08-06: 25 mg via INTRAMUSCULAR

## 2016-08-06 MED ORDER — MECLIZINE HCL 32 MG PO TABS: 32.0000 mg | ORAL_TABLET | Freq: Three times a day (TID) | ORAL | 0 refills | Status: DC | PRN

## 2016-08-06 MED ORDER — METHYLPREDNISOLONE ACETATE 80 MG/ML IJ SUSP
80.0000 mg | Freq: Once | INTRAMUSCULAR | Status: AC
  Administered 2016-08-06: 80 mg via INTRAMUSCULAR

## 2016-08-06 NOTE — Progress Notes (Signed)
Stephanie [name] is a 50 y.o. female who presents to [facility] today for dizziness and not feeling well:  1.  Dizziness:  Started 2 days ago when going to the bathroom in the middle of the night.  Describes dull, "aching" headache as well as "not feeling well." Occasionally feel like the room is spinning around her. Also just general malaise. Of note she has frequent sinus infections. She states that her head feels full. Also feels like her right ear is for that she is underwater.  No recent URI symptoms.  No sore throat.  No changes in vision.  Has tried ibuprofen with some relief of her headache.  She's had no fevers. No chills. No nausea or vomiting. She is eating and drinking well.    No fevers or chills.    She reports being under a significant amount of stress for the past month.  She is caring for her mother who has dementia.    ROS as above.  Pertinently, no chest pain, palpitations, SOB, Fever, Chills, Abd pain, N/V/D.   PMH reviewed. Patient is a nonsmoker.   Past Medical History:  Diagnosis Date  . High cholesterol   . Hyperlipidemia   . Hypertension    Past Surgical History:  Procedure Laterality Date  . CESAREAN SECTION    . CHOLECYSTECTOMY    . NASAL SINUS SURGERY  2017   Dr.Byers [facility]    Medications reviewed. Current Outpatient Prescriptions  Medication Sig Dispense Refill  . simvastatin (ZOCOR) 40 MG tablet TAKE 1 TABLET BY MOUTH ONCE EVERY EVENING 90 tablet 3   No current facility-administered medications for this visit.      Physical Exam:  BP 138/90 (BP Location: Right Arm, Patient Position: Sitting, Cuff Size: Normal)   Pulse 74   Temp 98.1 F (36.7 C) (Oral)   Resp 16   Ht 5\' 2"  (1.575 m)   Wt 168 lb 12.8 oz (76.6 kg)   SpO2 96%   BMI 30.87 kg/m  Gen:  Alert, cooperative patient who appears stated age in no acute distress.  Vital signs reviewed.  Lying on bed, nonxtoxic and in no distress but does look as if she feels  ill. Head: [place]/AT.   Eyes:  EOMI, PERRL.   Ears:  External ears WNL, Bilateral TM's normal without retraction, redness or bulging. Nose:  Septum midline.  TTP maxillary sinus Mouth:  MMM, tonsils non-erythematous, non-edematous. Neck: TTP paracervical muscles BL.  She does have good lateral movement both right and left gaze. Also able to flex and extend neck. Has some mild pain. Can fully flex neck to put her chin on her chest.  Pulm:  Clear to auscultation bilaterally with good air movement.  No wheezes or rales noted.   Cardiac:  Regular rate and rhythm without murmur auscultated.  Good S1/S2. Abd:  Soft/nondistended/nontender.  Exts: Non edematous BL  LE, warm and well perfused.  Back:  Negative Kernigs and Brudzinski's Neuro:  CN II - XII intact.  3 beats of nystagmus to the left. Peripheral neurologic examination completely negative, sensation and motor strength intact throughout.  Alert and oriented x 4  Results for orders placed or performed in visit on 08/06/16  POCT glucose (manual entry)  Result Value Ref Range   POC Glucose 103 (A) 70 - 99 mg/dl  POCT glycosylated hemoglobin (Hb A1C)  Result Value Ref Range   Hemoglobin A1C 5.8   POCT CBC  Result Value Ref Range  WBC 9.9 4.6 - 10.2 K/uL   Lymph, poc 2.8 0.6 - 3.4   POC LYMPH PERCENT 28.4 10 - 50 %L   MID (cbc) 0.4 0 - 0.9   POC MID % 4.2 0 - 12 %M   POC Granulocyte 6.7 2 - 6.9   Granulocyte percent 67.4 37 - 80 %G   RBC 4.52 4.04 - 5.48 M/uL   Hemoglobin 14.4 12.2 - 16.2 g/dL   HCT, POC 40.0 37.7 - 47.9 %   MCV 88.5 80 - 97 fL   MCH, POC 31.8 (A) 27 - 31.2 pg   MCHC 35.9 (A) 31.8 - 35.4 g/dL   RDW, POC 12.9 %   Platelet Count, POC 276 142 - 424 K/uL   MPV 7.6 0 - 99.8 fL  POCT urinalysis dipstick  Result Value Ref Range   Color, UA yellow yellow   Clarity, UA clear clear   Glucose, UA negative negative   Bilirubin, UA negative negative   Ketones, POC UA negative negative   Spec Grav, UA 1.020    Blood, UA  trace-intact (A) negative   pH, UA 7.0    Protein Ur, POC negative negative   Urobilinogen, UA 0.2    Nitrite, UA Negative Negative   Leukocytes, UA Negative Negative  POCT Microscopic Urinalysis ([facility])  Result Value Ref Range   WBC,UR,HPF,POC Few (A) None WBC/hpf   RBC,UR,HPF,POC Moderate (A) None RBC/hpf   Bacteria None None, Too numerous to count   Mucus Absent Absent   Epithelial Cells, UR Per Microscopy Moderate (A) None, Too numerous to count cells/hpf    Assessment and Plan:  1.  Sinus infection: - With possibility of ear infection she does have some fluid behind her right ear. -We'll treat with Omnicef and Depo-Medrol as this helps her in the past. -Meclizine for any recurrent vertigo.  Benadryl here, to help for any vertiginous symptoms on way home.  Husband is driving.  -Her CBG and A1c were negative here.  Negative cervical radiographs.  -White blood cell count was completely normal. -One initial concern was perhaps for meningitis based on her constellation of symptoms. However she has been afebrile, no altered mental status, no neck stiffness, and her white count was completely normal. -Of note her urine did return the some hematuria. The [place] would treat this. -At the end of the visit she was able to get up easily and walk out.   - warning precautions provided.  - FU with PCP in next week to assess for improvement.

## 2016-08-06 NOTE — Patient Instructions (Addendum)
Your xrays look great. Your labs looked good, except for your urine, which had some blood in it.    Your white count (sign of infection) was completely normal.  I do not think you have meningitis.  Steroid shot here.  Take the [place].  This will clear up your head and anything in your urine.    Take the Meclizine for the dizziness.    If you feel that you're getting worse despite treatment, start having fevers, started having nausea vomiting, back to see [place] or go to the emergency room.      IF you received an x-ray today, you will receive an invoice from [facility]. Please contact [facility] at [phone] with questions or concerns regarding your invoice.   IF you received labwork today, you will receive an invoice from [place]. Please contact [facility] at [phone] with questions or concerns regarding your invoice.   Our billing staff will not be able to assist you with questions regarding bills from these companies.  You will be contacted with the lab results as soon as they are available. The fastest way to get your results is to activate your My Chart account. Instructions are located on the last page of this paperwork. If you have not heard from [place] regarding the results in 2 weeks, please contact this office.

## 2016-08-07 LAB — COMPREHENSIVE METABOLIC PANEL
ALBUMIN: 4.7 g/dL (ref 3.5–5.5)
ALT: 31 IU/L (ref 0–32)
AST: 24 IU/L (ref 0–40)
Albumin/Globulin Ratio: 1.7 (ref 1.2–2.2)
Alkaline Phosphatase: 90 IU/L (ref 39–117)
BUN / CREAT RATIO: 12 (ref 9–23)
BUN: 8 mg/dL (ref 6–24)
Bilirubin Total: 0.3 mg/dL (ref 0.0–1.2)
CALCIUM: 9.4 mg/dL (ref 8.7–10.2)
CO2: 23 mmol/L (ref 18–29)
CREATININE: 0.68 mg/dL (ref 0.57–1.00)
Chloride: 102 mmol/L (ref 96–106)
GFR calc Af Amer: 118 mL/min/{1.73_m2} (ref >59)
GFR, EST NON AFRICAN AMERICAN: 102 mL/min/{1.73_m2} (ref >59)
GLOBULIN, TOTAL: 2.8 g/dL (ref 1.5–4.5)
GLUCOSE: 115 mg/dL — AB (ref 65–99)
Potassium: 4.2 mmol/L (ref 3.5–5.2)
SODIUM: 143 mmol/L (ref 134–144)
TOTAL PROTEIN: 7.5 g/dL (ref 6.0–8.5)

## 2016-08-08 ENCOUNTER — Ambulatory Visit (INDEPENDENT_AMBULATORY_CARE_PROVIDER_SITE_OTHER): Payer: Managed Care, Other (non HMO) | Admitting: Family Medicine

## 2016-08-08 ENCOUNTER — Encounter: Payer: Self-pay | Admitting: Family Medicine

## 2016-08-08 VITALS — BP 142/90 | HR 76 | Temp 97.8°F | Resp 18 | Wt 171.0 lb

## 2016-08-08 DIAGNOSIS — H6983 Other specified disorders of Eustachian tube, bilateral: Secondary | ICD-10-CM | POA: Diagnosis not present

## 2016-08-08 LAB — URINE CULTURE

## 2016-08-08 NOTE — Progress Notes (Signed)
   Subjective:    Patient ID: Stephanie [name], female    DOB: [birth year], 50 y.o.   MRN: [id number]  HPI Patient was seen in urgent care over the weekend complaining of disequilibrium, dull headache, neck pain, fevers, and malaise. She was given Omnicef due to  middle ear effusions as well as meclizine for dizziness. I reviewed her lab work from the urgent care. CBC was normal. Urine culture was negative. Patient states that her headache has improved and her dizziness has improved however she continues to have pain and pressure behind both years with decreased hearing and tinnitus. Past Medical History:  Diagnosis Date  . High cholesterol   . Hyperlipidemia   . Hypertension    Past Surgical History:  Procedure Laterality Date  . CESAREAN SECTION    . CHOLECYSTECTOMY    . NASAL SINUS SURGERY  2017   Dr[facility]Byers [facility]   Current Outpatient Prescriptions on File Prior to Visit  Medication Sig Dispense Refill  . cefdinir (OMNICEF) 300 MG capsule Take 1 capsule (300 mg total) by mouth 2 (two) times daily. 10 capsule 0  . meclizine (ANTIVERT) 32 MG tablet Take 1 tablet (32 mg total) by mouth 3 (three) times daily as needed. 30 tablet 0  . simvastatin (ZOCOR) 40 MG tablet TAKE 1 TABLET BY MOUTH ONCE EVERY EVENING 90 tablet 3   No current facility-administered medications on file prior to visit.    No Known Allergies Social History   Social History  . Marital status: Married    Spouse name: N/A  . Number of children: N/A  . Years of education: N/A   Occupational History  . Not on file.   Social History Main Topics  . Smoking status: Never Smoker  . Smokeless tobacco: Not on file  . Alcohol use No  . Drug use: Unknown  . Sexual activity: Yes     Comment: divorced   Other Topics Concern  . Not on file   Social History Narrative   ** Merged History Encounter **          Review of Systems  All other systems reviewed and are negative.      Objective:   Physical Exam    HENT:  Right Ear: External ear normal. Tympanic membrane is not injected, not scarred, not perforated, not erythematous, not retracted and not bulging. A middle ear effusion is present.  Left Ear: External ear normal. Tympanic membrane is not injected, not scarred, not perforated, not erythematous, not retracted and not bulging.  Mouth/Throat: Oropharynx is clear and moist. No oropharyngeal exudate.  Neck: Normal range of motion. Neck supple.  Cardiovascular: Normal rate, regular rhythm and normal heart sounds.   Pulmonary/Chest: Effort normal and breath sounds normal. No respiratory distress. She has no wheezes. She has no rales.  Lymphadenopathy:    She has no cervical adenopathy.  Vitals reviewed.         Assessment & Plan:  Eustachian tube dysfunction, bilateral  Recommended the patient complete her Omnicef. This should cover any sinusitis. Also recommended that she take Flonase and Zyrtec to help with eustachian tube dysfunction that I believe is contributing to her middle ear effusions, her tinnitus, and her mild hearing loss. Recheck in one week if not completely better or sooner if worse. Consider oral prednisone if worsening versus an ENT referral

## 2016-08-12 ENCOUNTER — Encounter: Payer: Self-pay | Admitting: Family Medicine

## 2016-09-01 ENCOUNTER — Encounter: Payer: Self-pay | Admitting: Family Medicine

## 2016-09-02 ENCOUNTER — Encounter: Payer: Self-pay | Admitting: Family Medicine

## 2016-09-23 ENCOUNTER — Other Ambulatory Visit: Payer: Self-pay | Admitting: Family Medicine

## 2016-09-23 ENCOUNTER — Encounter: Payer: Self-pay | Admitting: Family Medicine

## 2016-09-23 ENCOUNTER — Telehealth: Payer: Self-pay | Admitting: Family Medicine

## 2016-09-23 MED ORDER — CEFDINIR 300 MG PO CAPS: 300.0000 mg | ORAL_CAPSULE | Freq: Two times a day (BID) | ORAL | 0 refills | Status: DC

## 2016-09-23 NOTE — Telephone Encounter (Signed)
Pt states she is having ear pain and wants to know if antibiotic can be called in, if not then she will want to make an appointment.

## 2016-11-22 ENCOUNTER — Emergency Department (HOSPITAL_COMMUNITY): Payer: Managed Care, Other (non HMO)

## 2016-11-22 ENCOUNTER — Observation Stay (HOSPITAL_COMMUNITY)
Admission: EM | Admit: 2016-11-22 | Discharge: 2016-11-23 | Disposition: A | Discharge status: Home / Self Care | Payer: Managed Care, Other (non HMO) | Attending: Interventional Cardiology | Admitting: Interventional Cardiology

## 2016-11-22 ENCOUNTER — Encounter (HOSPITAL_COMMUNITY): Payer: Self-pay | Admitting: *Deleted

## 2016-11-22 DIAGNOSIS — Z79899 Other long term (current) drug therapy: Secondary | ICD-10-CM | POA: Insufficient documentation

## 2016-11-22 DIAGNOSIS — E785 Hyperlipidemia, unspecified: Secondary | ICD-10-CM | POA: Diagnosis present

## 2016-11-22 DIAGNOSIS — R0789 Other chest pain: Secondary | ICD-10-CM | POA: Diagnosis not present

## 2016-11-22 DIAGNOSIS — I10 Essential (primary) hypertension: Secondary | ICD-10-CM | POA: Diagnosis not present

## 2016-11-22 DIAGNOSIS — K219 Gastro-esophageal reflux disease without esophagitis: Secondary | ICD-10-CM | POA: Insufficient documentation

## 2016-11-22 DIAGNOSIS — Z9049 Acquired absence of other specified parts of digestive tract: Secondary | ICD-10-CM | POA: Diagnosis not present

## 2016-11-22 DIAGNOSIS — Z8249 Family history of ischemic heart disease and other diseases of the circulatory system: Secondary | ICD-10-CM | POA: Insufficient documentation

## 2016-11-22 DIAGNOSIS — E7800 Pure hypercholesterolemia, unspecified: Secondary | ICD-10-CM | POA: Insufficient documentation

## 2016-11-22 DIAGNOSIS — R61 Generalized hyperhidrosis: Secondary | ICD-10-CM | POA: Insufficient documentation

## 2016-11-22 DIAGNOSIS — Z808 Family history of malignant neoplasm of other organs or systems: Secondary | ICD-10-CM | POA: Diagnosis not present

## 2016-11-22 DIAGNOSIS — R42 Dizziness and giddiness: Secondary | ICD-10-CM | POA: Insufficient documentation

## 2016-11-22 DIAGNOSIS — R110 Nausea: Secondary | ICD-10-CM | POA: Diagnosis not present

## 2016-11-22 DIAGNOSIS — Z7982 Long term (current) use of aspirin: Secondary | ICD-10-CM | POA: Diagnosis not present

## 2016-11-22 DIAGNOSIS — K222 Esophageal obstruction: Secondary | ICD-10-CM | POA: Diagnosis not present

## 2016-11-22 DIAGNOSIS — R079 Chest pain, unspecified: Secondary | ICD-10-CM | POA: Diagnosis present

## 2016-11-22 LAB — COMPREHENSIVE METABOLIC PANEL
ALK PHOS: 70 U/L (ref 38–126)
ALT: 40 U/L (ref 14–54)
AST: 39 U/L (ref 15–41)
Albumin: 4 g/dL (ref 3.5–5.0)
Anion gap: 8 (ref 5–15)
BILIRUBIN TOTAL: 0.9 mg/dL (ref 0.3–1.2)
BUN: 8 mg/dL (ref 6–20)
CALCIUM: 9.1 mg/dL (ref 8.9–10.3)
CO2: 27 mmol/L (ref 22–32)
Chloride: 104 mmol/L (ref 101–111)
Creatinine, Ser: 0.79 mg/dL (ref 0.44–1.00)
GFR calc Af Amer: >60 mL/min (ref >60)
GLUCOSE: 160 mg/dL — AB (ref 65–99)
Potassium: 3.9 mmol/L (ref 3.5–5.1)
Sodium: 139 mmol/L (ref 135–145)
TOTAL PROTEIN: 7.2 g/dL (ref 6.5–8.1)

## 2016-11-22 LAB — CBC WITH DIFFERENTIAL/PLATELET
BASOS PCT: 0 %
Basophils Absolute: 0 10*3/uL (ref 0.0–0.1)
Eosinophils Absolute: 0.3 10*3/uL (ref 0.0–0.7)
Eosinophils Relative: 5 %
HEMATOCRIT: 40.1 % (ref 36.0–46.0)
HEMOGLOBIN: 13.8 g/dL (ref 12.0–15.0)
LYMPHS ABS: 3.1 10*3/uL (ref 0.7–4.0)
LYMPHS PCT: 46 %
MCH: 30.6 pg (ref 26.0–34.0)
MCHC: 34.4 g/dL (ref 30.0–36.0)
MCV: 88.9 fL (ref 78.0–100.0)
MONOS PCT: 3 %
Monocytes Absolute: 0.2 10*3/uL (ref 0.1–1.0)
NEUTROS ABS: 3 10*3/uL (ref 1.7–7.7)
Neutrophils Relative %: 46 %
Platelets: 237 10*3/uL (ref 150–400)
RBC: 4.51 MIL/uL (ref 3.87–5.11)
RDW: 13 % (ref 11.5–15.5)
WBC: 6.7 10*3/uL (ref 4.0–10.5)

## 2016-11-22 LAB — TROPONIN I
Troponin I: <0.03 ng/mL (ref <0.03)
Troponin I: <0.03 ng/mL (ref <0.03)

## 2016-11-22 LAB — I-STAT TROPONIN, ED
TROPONIN I, POC: 0 ng/mL (ref 0.00–0.08)
Troponin i, poc: 0 ng/mL (ref 0.00–0.08)

## 2016-11-22 MED ORDER — SODIUM CHLORIDE 0.9% FLUSH
3.0000 mL | Freq: Two times a day (BID) | INTRAVENOUS | Status: DC
  Administered 2016-11-22 – 2016-11-23 (×2): 3 mL via INTRAVENOUS

## 2016-11-22 MED ORDER — SODIUM CHLORIDE 0.9% FLUSH: 3.0000 mL | INTRAVENOUS | Status: DC | PRN

## 2016-11-22 MED ORDER — NITROGLYCERIN 0.4 MG SL SUBL: 0.4000 mg | SUBLINGUAL_TABLET | SUBLINGUAL | Status: DC | PRN

## 2016-11-22 MED ORDER — ENOXAPARIN SODIUM 40 MG/0.4ML ~~LOC~~ SOLN: 40.0000 mg | SUBCUTANEOUS | Status: DC

## 2016-11-22 MED ORDER — ONDANSETRON HCL 4 MG/2ML IJ SOLN: 4.0000 mg | Freq: Four times a day (QID) | INTRAMUSCULAR | Status: DC | PRN

## 2016-11-22 MED ORDER — SODIUM CHLORIDE 0.9 % IV SOLN: 250.0000 mL | INTRAVENOUS | Status: DC | PRN

## 2016-11-22 MED ORDER — ACETAMINOPHEN 325 MG PO TABS: 650.0000 mg | ORAL_TABLET | ORAL | Status: DC | PRN

## 2016-11-22 MED ORDER — ASPIRIN EC 81 MG PO TBEC
81.0000 mg | DELAYED_RELEASE_TABLET | Freq: Every day | ORAL | Status: DC
  Administered 2016-11-23: 81 mg via ORAL
  Filled 2016-11-22: qty 1

## 2016-11-22 NOTE — H&P (Addendum)
The patient has been seen in conjunction with Delos Haring, PA-C. All aspects of care have been considered and discussed. The patient has been personally interviewed, examined, and all clinical data has been reviewed.   Chest pain with atypical qualities but difficult to totally exclude acute coronary syndrome/unstable angina.  She needs to have serial markers and EKGs. If negative will undergo a stress myocardial perfusion study.  Because she has used phentermine which can cause right heart valve lesions and pulmonary hypertension, a 2-D echocardiogram will also be performed.  Observation status.     Cardiology History & Physical    Patient ID: Stephanie [name] MRN: [id number], DOB: [birth year] Date of Encounter: 11/22/2016, 1:32 PM Primary Physician: Odette Fraction, MD Primary Cardiologist: New  Chief Complaint: Chest pain Reason for Admission:  Chest pain Requesting MD: Dr. Darl Householder, ER  HPI: MADALAINE PORTIER is a 51 y.o. female with history of hypertension (has not been on antihypertensive for over 1 year and BP controlled) and hypercholesterolemia (Zocor). She presented to the ER by EMS after having intermittent left sided chest pain for the past two-three days. Her pain lasts usually 5-10 minutes and is not exacerbated or relieved by activity or rest. She admits to having a lot of stress recently and hx of GI problems. Her pain is made worse by external pressure. She is also on her last 1-2 days of a 30 day supply of the diet pill phentermine (appetite suppresant).    This morning she had an episode of the pain with associated nausea, dizziness and diaphoresis. Pain radiated down her left arm.They gave her 324 mg ASA and 1 nitro en route which improved her pain from 8/10 to 4/10. Her pain has returned and currently has been a very mild pressure for the past 50 minutes.  Her father passed away at age 55 from an MI. She denies having known history of CAD or PE. She had previous stress  test > 10 years ago with [facility] and believes it was a normal test. No records available.  In the ER she has had two negative Troponins, EKG is HR 89 with sinus rhythm, chest xray is normal.   Past Medical History:  Diagnosis Date  . High cholesterol   . Hyperlipidemia   . Hypertension      Surgical History:  Past Surgical History:  Procedure Laterality Date  . CESAREAN SECTION    . CHOLECYSTECTOMY    . NASAL SINUS SURGERY  2017   Dr.Byers [facility]     Home Meds: Prior to Admission medications   Medication Sig Start Date End Date Taking? Authorizing Provider  simvastatin (ZOCOR) 40 MG tablet TAKE 1 TABLET BY MOUTH ONCE EVERY EVENING Patient taking differently: Take 40 mg by mouth daily at 6 PM.  07/27/16  Yes Susy Frizzle, MD  cefdinir (OMNICEF) 300 MG capsule Take 1 capsule (300 mg total) by mouth 2 (two) times daily. Patient not taking: Reported on 11/22/2016 09/23/16   Susy Frizzle, MD  meclizine (ANTIVERT) 32 MG tablet Take 1 tablet (32 mg total) by mouth 3 (three) times daily as needed. Patient not taking: Reported on 11/22/2016 08/06/16   Alveda Reasons, MD    Allergies: No Known Allergies  Social History   Social History  . Marital status: Married    Spouse name: N/A  . Number of children: N/A  . Years of education: N/A   Occupational History  . Not on file.   Social History  Main Topics  . Smoking status: Never Smoker  . Smokeless tobacco: Never Used  . Alcohol use No  . Drug use: Unknown  . Sexual activity: Yes     Comment: divorced   Other Topics Concern  . Not on file   Social History Narrative   ** Merged History Encounter **         Family History  Problem Relation Age of Onset  . Hypertension Mother   . Hyperlipidemia Mother   . Heart disease Father   . Hyperlipidemia Brother   . Hypertension Brother   . Cancer Maternal Grandmother     brain tumor  . Cancer Maternal Grandfather     retinoblastoma    Review of  Systems: General: negative for chills, fever, night sweats or weight changes.  Cardiovascular: negative for edema, orthopnea, palpitations, paroxysmal nocturnal dyspnea, shortness of breath or dyspnea on exertion Dermatological: negative for rash Respiratory: negative for cough or wheezing Urologic: negative for hematuria Abdominal: negative for  vomiting, diarrhea, bright red blood per rectum, melena, or hematemesis Neurologic: negative for visual changes, syncope, or dizziness All other systems reviewed and are otherwise negative except as noted above.  Labs:   Lab Results  Component Value Date   WBC 6.7 11/22/2016   HGB 13.8 11/22/2016   HCT 40.1 11/22/2016   MCV 88.9 11/22/2016   PLT 237 11/22/2016     Recent Labs Lab 11/22/16 0920  NA 139  K 3.9  CL 104  CO2 27  BUN 8  CREATININE 0.79  CALCIUM 9.1  PROT 7.2  BILITOT 0.9  ALKPHOS 70  ALT 40  AST 39  GLUCOSE 160*   Lab Results  Component Value Date   CHOL 185 07/18/2016   HDL 43 (L) 07/18/2016   LDLCALC 123 (H) 07/18/2016   TRIG 95 07/18/2016   Lab Results  Component Value Date   DDIMER  07/04/2007    <0.22        AT THE INHOUSE ESTABLISHED CUTOFF VALUE OF 0.48 ug/mL FEU, THIS ASSAY HAS BEEN DOCUMENTED IN THE LITERATURE TO HAVE    Radiology/Studies:   Dg Chest 2 View Result Date: 11/22/2016 There is no active cardiopulmonary disease.    Wt Readings from Last 3 Encounters:  08/08/16 171 lb (77.6 kg)  08/06/16 168 lb 12.8 oz (76.6 kg)  05/25/16 164 lb (74.4 kg)    EKG: HR 89 with sinus rhythm  Physical Exam: Blood pressure 121/69, pulse 67, temperature 98.6 F (37 C), temperature source Oral, resp. rate 14, SpO2 96 %. There is no height or weight on file to calculate BMI. General: Well developed, well nourished, in no acute distress. Head: Normocephalic, atraumatic, sclera non-icteric, no xanthomas, nares are without discharge.  Neck: Negative for carotid bruits. JVD not elevated. Lungs:  Clear bilaterally to auscultation without wheezes, rales, or rhonchi. Breathing is unlabored. Heart: RRR with S1 S2. No murmurs, rubs, or gallops appreciated +tenderness to palpation of chest wall. Abdomen: Soft, non-tender, non-distended with normoactive bowel sounds. No hepatomegaly. No rebound/guarding. No obvious abdominal masses. Msk:  Strength and tone appear normal for age. Extremities: No clubbing or cyanosis. No edema.  Distal pedal pulses are 2+ and equal bilaterally. Neuro: Alert and oriented X 3. No focal deficit. No facial asymmetry. Moves all extremities spontaneously. Psych:  Responds to questions appropriately with a normal affect.   Assessment and Plan    1. Chest pain: Hx of normal stress test > 10 years ago with family hx of  dad having MI at age 40. Risk factors include hypertension and high cholesterol. Her symptoms are atypical and she has had 2 negative troponins- pain could be cardiac, GI, psych related, or precordial pains for the Phentermine use. However, it is difficult to say for sure that it isn't ischemia related since she has not had any cardiac work-up in over 10 years. --Walking stress test in am (creatinine 0.79) to r/o ischemia/CAD. 2D echo to exclude right heart involvement from Phentermine. --Obs admission. --Sq lovenox ACS dose and aspirin 81 mg --Repeat EKG in am --Serial markers overnight  2. Hx  Hypertension: Bp controlled. Has not needed medication for over 1 year.  3. Diet Pill: Has been on Phenetermine (appetite suppresant) for weight loss for the past month.  4. Hypercholesterolemia: Most recent LDL 123 on Zocor 40mg , continue and discuss lifestyle modification.   Signed, Linus Mako PA-C 11/22/2016, 1:32 PM

## 2016-11-22 NOTE — ED Provider Notes (Signed)
[facility] DEPT Provider Note   CSN: [id number] Arrival date & time: 11/22/16  0910     History   Chief Complaint Chief Complaint  Patient presents with  . Chest Pain    HPI Stephanie [name] is a 51 y.o. female hx of HL, HTN, Here presenting with chest pain, nausea. Patient has been having intermittent chest pain for the last 2-3 days. She states that there is no certain pattern and not worse with exertion. 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 Denies any radiation to the pain denies any shortness of breath. She states that the pain did radiate down her left arm and she had some tingling down the left arm. Initially was 8 out 10 and patient was given 324 aspirin, 1 nitroglycerin by EMS and now it is 4/10. No personal hx of CAD or PE. Father had MI age 20s. Patient had previous stress test many years ago.   The history is provided by the patient.    Past Medical History:  Diagnosis Date  . High cholesterol   . Hyperlipidemia   . Hypertension     Patient Active Problem List   Diagnosis Date Noted  . Chest pain 11/22/2016  . Hyperlipidemia   . Hypertension   . STRICTURE AND STENOSIS OF ESOPHAGUS 02/24/2009  . GERD 02/12/2009  . NAUSEA AND VOMITING 02/12/2009  . ABDOMINAL PAIN-EPIGASTRIC 02/12/2009    Past Surgical History:  Procedure Laterality Date  . CESAREAN SECTION    . CHOLECYSTECTOMY    . NASAL SINUS SURGERY  2017   Dr.Byers [facility]    OB History    No data available       Home Medications    Prior to Admission medications   Medication Sig Start Date End Date Taking? Authorizing Provider  simvastatin (ZOCOR) 40 MG tablet TAKE 1 TABLET BY MOUTH ONCE EVERY EVENING Patient taking differently: Take 40 mg by mouth daily at 6 PM.  07/27/16  Yes Susy Frizzle, MD  cefdinir (OMNICEF) 300 MG capsule Take 1 capsule (300 mg total) by mouth 2 (two)  times daily. Patient not taking: Reported on 11/22/2016 09/23/16   Susy Frizzle, MD  meclizine (ANTIVERT) 32 MG tablet Take 1 tablet (32 mg total) by mouth 3 (three) times daily as needed. Patient not taking: Reported on 11/22/2016 08/06/16   Alveda Reasons, MD    Family History Family History  Problem Relation Age of Onset  . Hypertension Mother   . Hyperlipidemia Mother   . Heart disease Father   . Hyperlipidemia Brother   . Hypertension Brother   . Cancer Maternal Grandmother     brain tumor  . Cancer Maternal Grandfather     retinoblastoma    Social History Social History  Substance Use Topics  . Smoking status: Never Smoker  . Smokeless tobacco: Never Used  . Alcohol use No     Allergies   Patient has no known allergies.   Review of Systems Review of Systems  Cardiovascular: Positive for chest pain.  All other systems reviewed and are negative.    Physical Exam Updated Vital Signs BP 135/80   Pulse 80   Temp 98.6 F (37 C) (Oral)   Resp 13   SpO2 99%   Physical Exam  Constitutional: She is oriented to person, place, and time. She appears well-developed.  HENT:  Head: Normocephalic.  Mouth/Throat: Oropharynx is clear and moist.  Eyes: EOM are normal. Pupils are equal, round, and reactive to light.  Neck: Normal range of motion. Neck supple.  Cardiovascular: Normal rate, regular rhythm and normal heart sounds.   Pulmonary/Chest: Effort normal and breath sounds normal. No respiratory distress. She has no wheezes. She has no rales. She exhibits no tenderness.  Abdominal: Soft. Bowel sounds are normal. She exhibits no distension. There is no tenderness.  Musculoskeletal: Normal range of motion. She exhibits no edema or deformity.  No calf tenderness   Neurological: She is alert and oriented to person, place, and time. No cranial nerve deficit. Coordination normal.  Skin: Skin is warm.  Psychiatric: She has a normal mood and affect.  Nursing note and  vitals reviewed.    ED Treatments / Results  Labs (all labs ordered are listed, but only abnormal results are displayed) Labs Reviewed  COMPREHENSIVE METABOLIC PANEL - Abnormal; Notable for the following:       Result Value   Glucose, Bld 160 (*)    All other components within normal limits  CBC WITH DIFFERENTIAL/PLATELET  Randolm Idol, ED  I-STAT TROPOININ, ED    EKG  EKG Interpretation  Date/Time:  Tuesday November 22 2016 09:21:46 EDT Ventricular Rate:  89 PR Interval:    QRS Duration: 87 QT Interval:  362 QTC Calculation: 441 R Axis:   24 Text Interpretation:  Sinus rhythm No significant change since last tracing Confirmed by YAO  MD, DAVID ([id number]) on 11/22/2016 9:25:59 AM Also confirmed by Darl Householder  MD, [place] ([id number]), editor Drema Pry [phone])  on 11/22/2016 9:44:41 AM       Radiology Dg Chest 2 View  Result Date: 11/22/2016 CLINICAL DATA:  Left-sided chest pain, numbness in the left fingers, hypertension, nausea, tachycardia. EXAM: CHEST  2 VIEW COMPARISON:  Chest x-ray of July 04, 2007 FINDINGS: The lungs are reasonably well inflated and clear. The heart is top-normal in size. The pulmonary vascularity is normal. The mediastinum is normal in width. There is no pleural effusion. The bony thorax is unremarkable. IMPRESSION: There is no active cardiopulmonary disease. Electronically Signed   By: David  Martinique M.D.   On: 11/22/2016 09:50    Procedures Procedures (including critical care time)  Medications Ordered in ED Medications - No data to display   Initial Impression / Assessment and Plan / ED Course  I have reviewed the triage vital signs and the nursing notes.  Pertinent labs & imaging results that were available during my care of the patient were reviewed by me and considered in my medical decision making (see chart for details).     Stephanie [name] is a 51 y.o. female here with chest pain. Chest pain for several days, but worsened this morning  with some nausea. Has family hx of CAD. Will get labs, trop. Will consult cardiology.   1 pm  Labs unremarkable. Trop neg. However, high risk for ACS given family hx and her symptoms. Cardiology to admit for stress test.   Final Clinical Impressions(s) / ED Diagnoses   Final diagnoses:  Other chest pain  Chest pain    New Prescriptions New Prescriptions   No medications on file     Drenda Freeze, MD 11/22/16 1448

## 2016-11-22 NOTE — ED Notes (Signed)
Got patient into a gown on the monitor did ekg shown to dr Darl Householder

## 2016-11-22 NOTE — ED Notes (Signed)
Patient transported to X-ray 

## 2016-11-22 NOTE — ED Triage Notes (Signed)
Pt arrives from work via [employer]. Pt states she has had intermittent left sided CP x2 days. Pt states this morning she began having CP and was having nausea, dizziness and diaphoresis. Pt denies radiation. Pt was given 324mg  ASA and 1 nitro PTA. Pt states the nitro decreased pain from a 6/10-4/10.

## 2016-11-23 ENCOUNTER — Observation Stay (HOSPITAL_BASED_OUTPATIENT_CLINIC_OR_DEPARTMENT_OTHER): Payer: Managed Care, Other (non HMO)

## 2016-11-23 ENCOUNTER — Other Ambulatory Visit: Payer: Self-pay | Admitting: Emergency Medicine

## 2016-11-23 DIAGNOSIS — R079 Chest pain, unspecified: Secondary | ICD-10-CM | POA: Diagnosis not present

## 2016-11-23 DIAGNOSIS — R071 Chest pain on breathing: Secondary | ICD-10-CM

## 2016-11-23 DIAGNOSIS — R0789 Other chest pain: Secondary | ICD-10-CM | POA: Diagnosis not present

## 2016-11-23 LAB — NM MYOCAR MULTI W/SPECT W/WALL MOTION / EF
CHL CUP RESTING HR STRESS: 91 {beats}/min
CSEPHR: 99 %
Estimated workload: 7 METS
Exercise duration (min): 7 min
Exercise duration (sec): 0 s
MPHR: 170 {beats}/min
Peak HR: 169 {beats}/min

## 2016-11-23 LAB — LIPID PANEL
CHOL/HDL RATIO: 6.3 ratio
Cholesterol: 219 mg/dL — ABNORMAL HIGH (ref 0–200)
HDL: 35 mg/dL — AB (ref >40)
LDL CALC: UNDETERMINED mg/dL (ref 0–99)
Triglycerides: 401 mg/dL — ABNORMAL HIGH (ref <150)
VLDL: UNDETERMINED mg/dL (ref 0–40)

## 2016-11-23 LAB — TROPONIN I

## 2016-11-23 MED ORDER — TECHNETIUM TC 99M TETROFOSMIN IV KIT
30.0000 | PACK | Freq: Once | INTRAVENOUS | Status: AC | PRN
  Administered 2016-11-23: 30 via INTRAVENOUS

## 2016-11-23 MED ORDER — TECHNETIUM TC 99M TETROFOSMIN IV KIT
10.0000 | PACK | Freq: Once | INTRAVENOUS | Status: AC | PRN
  Administered 2016-11-23: 10 via INTRAVENOUS

## 2016-11-23 MED ORDER — AMLODIPINE BESYLATE 5 MG PO TABS: 5.0000 mg | ORAL_TABLET | Freq: Every day | ORAL | 2 refills | Status: DC

## 2016-11-23 NOTE — Progress Notes (Signed)
The patient has been seen in conjunction with Delos Haring, PA-C. All aspects of care have been considered and discussed. The patient has been personally interviewed, examined, and all clinical data has been reviewed.   Once myocardial perfusion imaging is complete, and if low risk as anticipate a, she will be discharged home. If echo is not done, it could be performed as an outpatient.  Blood pressure needs therapy. Add amlodipine 5 mg daily.  Educate on low-fat diet. Consider statin therapy if lipids remain high.  Will need follow-up with PCP.  Progress Note  Patient Name: Stephanie [name] Date of Encounter: 11/23/2016  Primary Cardiologist: New  Subjective   Patient seen in [place] for stress test. No CP last night. No pain during test.  Inpatient Medications    Scheduled Meds: . aspirin EC  81 mg Oral Daily  . enoxaparin (LOVENOX) injection  40 mg Subcutaneous Q24H  . sodium chloride flush  3 mL Intravenous Q12H   Continuous Infusions:  PRN Meds: sodium chloride, acetaminophen, nitroGLYCERIN, ondansetron (ZOFRAN) IV, sodium chloride flush   Vital Signs    Vitals:   11/23/16 0918 11/23/16 0920 11/23/16 0922 11/23/16 0924  BP: (!) 188/82 (!) 172/83 (!) 169/79 (!) 158/72  Pulse:      Resp:      Temp:      TempSrc:      SpO2:      Weight:      Height:        Intake/Output Summary (Last 24 hours) at 11/23/16 0937 Last data filed at 11/22/16 1900  Gross per 24 hour  Intake              480 ml  Output                0 ml  Net              480 ml   Filed Weights   11/22/16 1600 11/22/16 1656 11/23/16 0434  Weight: 167 lb (75.8 kg) 167 lb (75.8 kg) 165 lb 3.2 oz (74.9 kg)    Telemetry      ECG    NSR, resting HR 86 - Personally Reviewed  Physical Exam   GEN: No acute distress.   Neck: No JVD Cardiac: RRR, no murmurs, rubs, or gallops.  Respiratory: Clear to auscultation bilaterally.  GI: Soft, nontender, non-distended  MS: No edema; No  deformity. Neuro:  Nonfocal  Psych: Normal affect   Labs    Chemistry Recent Labs Lab 11/22/16 0920  NA 139  K 3.9  CL 104  CO2 27  GLUCOSE 160*  BUN 8  CREATININE 0.79  CALCIUM 9.1  PROT 7.2  ALBUMIN 4.0  AST 39  ALT 40  ALKPHOS 70  BILITOT 0.9  GFRNONAA >60  GFRAA >60  ANIONGAP 8     Hematology Recent Labs Lab 11/22/16 0920  WBC 6.7  RBC 4.51  HGB 13.8  HCT 40.1  MCV 88.9  MCH 30.6  MCHC 34.4  RDW 13.0  PLT 237    Cardiac Enzymes Recent Labs Lab 11/22/16 1655 11/22/16 2234 11/23/16 0444  TROPONINI <0.03 <0.03 <0.03    Recent Labs Lab 11/22/16 0935 11/22/16 1314  TROPIPOC 0.00 0.00     Radiology    Dg Chest 2 View  Result Date: 11/22/2016 CLINICAL DATA:  Left-sided chest pain, numbness in the left fingers, hypertension, nausea, tachycardia. EXAM: CHEST  2 VIEW COMPARISON:  Chest x-ray of July 04, 2007  FINDINGS: The lungs are reasonably well inflated and clear. The heart is top-normal in size. The pulmonary vascularity is normal. The mediastinum is normal in width. There is no pleural effusion. The bony thorax is unremarkable. IMPRESSION: There is no active cardiopulmonary disease. Electronically Signed   By: David  Martinique M.D.   On: 11/22/2016 09:50    Cardiac Studies   Echo pending  Nuclear Exercise stress test pending  Patient Profile     51 y.o. female with history of hypertension (has not been on antihypertensive for over 1 year and BP controlled) and hypercholesterolemia (Zocor). She presented to the ER by EMS after having intermittent left sided chest pain for the past two-three days. Her pain lasts usually 5-10 minutes and is not exacerbated or relieved by activity or rest. She admits to having a lot of stress recently and hx of GI problems. Her pain is made worse by external pressure. She is also on her last 1-2 days of a 30 day supply of the diet pill phentermine (appetite suppresant).   Assessment & Plan     1. Chest pain:   Atypical chest pain. Pt feels like it was likely stress related. She has hx of hypertension, family hx of heart disease and high cholesterol. Neg Troponins overnight. Pt has had echo and Nuc med stress test done today, results are pending. No CP  2. Hx  Hypertension: Bp controlled. Has not needed medication for over 1 year.  3. Diet Pill: Has been on Phentermine (appetite suppresant) for weight loss for the past month. Recommend she no longer take this as it can cause right heart valve lesions and pulmonary hypertension.  4. Hypercholesterolemia: Most recent LDL 123 on Zocor 40mg , continue and discuss lifestyle modification.  If echo and stress test are normal I anticipate patient to be discharged this afternoon with PCP follow-up. Continue Zocor and recommend eating a healthy diet ,exercise, and to no longer take Phentermine.  Kristopher Glee, PA-C  11/23/2016, 9:37 AM

## 2016-11-23 NOTE — Discharge Summary (Signed)
The patient has been seen in conjunction with Delos Haring, PA-C. All aspects of care have been considered and discussed. The patient has been personally interviewed, examined, and all clinical data has been reviewed.   No evidence of CAD/coronary ischemia.  Chest pain is not felt to be cardiac related.  Suggested that phentermine be discontinued. 2-D Doppler echo will be done as an outpatient to rule out pulmonary hypertension and right-sided heart valve involvement..   Discharge Summary    Patient ID: Stephanie [name],  MRN: [id number], DOB/AGE: 1966-07-24 51 y.o.  Admit date: 11/22/2016 Discharge date: 11/23/2016  Primary Care Provider: [facility] TOM Primary Cardiologist: New   Discharge Diagnoses    Principal Problem:   Chest pain Active Problems:   Hyperlipidemia   Hypertension   Allergies No Known Allergies   History of Present Illness     51 y.o. female with history of hypertension (has not been on antihypertensive for over 1 year and BP controlled)and hypercholesterolemia (Zocor). She presented to the ER by EMS after having intermittent left sided chest pain for the past two-three days. Her pain lasts usually 5-10 minutes and is not exacerbated or relieved by activity or rest. She admits to having a lot of stress recently and hx of GI problems. Her pain is made worse by external pressure. She is also on her last 1-2 days of a 30 day supply of the diet pill phentermine (appetite suppresant).   Hospital Course     Consultants: None  He had multiple negative Troponins during this stay with no reo-ccurance of chest pains. She now believes it was likely due to stress, her pain was very atypical and our suspicion for CAD or ACS was low. She had a Nuclear Treadmill stress test that showed her to be at low risk. Will start her on Amlodipine 5 mg daily for elevated blood pressures. Discussed low-fat diet and consider starting statin therapy if lipids remain high. Will  discuss she follow-up with her [facility] TOM, [place].  Dr. Tamala Julian has seen patient today as well and recommends and outpatient echo which has been ordered. A work excuse note was provided as well as needed. Discharge medications are listed below.  _____________  Discharge Vitals Blood pressure 121/71, pulse 77, temperature 97.8 F (36.6 C), temperature source Oral, resp. rate 20, height 5\' 2"  (1.575 m), weight 165 lb 3.2 oz (74.9 kg), SpO2 100 %.  Filed Weights   11/22/16 1600 11/22/16 1656 11/23/16 0434  Weight: 167 lb (75.8 kg) 167 lb (75.8 kg) 165 lb 3.2 oz (74.9 kg)    Labs & Radiologic Studies     CBC  Recent Labs  11/22/16 0920  WBC 6.7  NEUTROABS 3.0  HGB 13.8  HCT 40.1  MCV 88.9  PLT 366   Basic Metabolic Panel  Recent Labs  11/22/16 0920  NA 139  K 3.9  CL 104  CO2 27  GLUCOSE 160*  BUN 8  CREATININE 0.79  CALCIUM 9.1   Liver Function Tests  Recent Labs  11/22/16 0920  AST 39  ALT 40  ALKPHOS 70  BILITOT 0.9  PROT 7.2  ALBUMIN 4.0  Cardiac Enzymes  Recent Labs  11/22/16 1655 11/22/16 2234 11/23/16 0444  TROPONINI <0.03 <0.03 <0.03  Fasting Lipid Panel  Recent Labs  11/23/16 0444  CHOL 219*  HDL 35*  LDLCALC UNABLE TO CALCULATE IF TRIGLYCERIDE OVER 400 mg/dL  TRIG 401*  CHOLHDL 6.3    Dg Chest 2 View  Result Date: 11/22/2016  CLINICAL DATA:  Left-sided chest pain, numbness in the left fingers, hypertension, nausea, tachycardia. EXAM: CHEST  2 VIEW COMPARISON:  Chest x-ray of July 04, 2007 FINDINGS: The lungs are reasonably well inflated and clear. The heart is top-normal in size. The pulmonary vascularity is normal. The mediastinum is normal in width. There is no pleural effusion. The bony thorax is unremarkable. IMPRESSION: There is no active cardiopulmonary disease. Electronically Signed   By: David  Martinique M.D.   On: 11/22/2016 09:50   Nm Myocar Multi W/spect W/wall Motion / Ef  Result Date: 11/23/2016 CLINICAL DATA:  Chest  pain.  Shortness of breath. EXAM: MYOCARDIAL IMAGING WITH SPECT (REST AND EXERCISE) GATED LEFT VENTRICULAR WALL MOTION STUDY LEFT VENTRICULAR EJECTION FRACTION TECHNIQUE: Standard myocardial SPECT imaging was performed after resting intravenous injection of 10 mCi Tc-82m tetrofosmin. Subsequently, exercise tolerance test was performed by the patient under the supervision of the Cardiology staff. At peak-stress, 30 mCi Tc-12m tetrofosmin was injected intravenously and standard myocardial SPECT imaging was performed. Quantitative gated imaging was also performed to evaluate left ventricular wall motion, and estimate left ventricular ejection fraction. COMPARISON:  Chest radiograph 11/22/2016. FINDINGS: Perfusion: No decreased activity in the left ventricle on stress imaging to suggest reversible ischemia or infarction. Wall Motion: Normal left ventricular wall motion. No left ventricular dilation. Left Ventricular Ejection Fraction: 73 % End diastolic volume 54 ml End systolic volume 14 ml IMPRESSION: 1. No reversible ischemia or infarction. 2. Normal left ventricular wall motion. 3. Left ventricular ejection fraction 73% 4. Non invasive risk stratification*: Low *2012 Appropriate Use Criteria for Coronary Revascularization Focused Update: J Am Coll Cardiol. 1245;80(9):983-382. http://content.airportbarriers.com.aspx?articleid=[email or web address] Electronically Signed   By: Van Clines M.D.   On: 11/23/2016 12:50     Diagnostic Studies/Procedures    Nuclear Treadmill Stress test 11/23/2016  IMPRESSION: 1. No reversible ischemia or infarction.  2. Normal left ventricular wall motion.  3. Left ventricular ejection fraction 73%  4. Non invasive risk stratification*: Low _____________    Disposition   Pt is being discharged home today in good condition.  Follow-up Plans & Appointments    Follow-up Information    [facility] up on 12/07/2016.   Specialty:   Cardiology Why:  you have an 2D echo scheduled for April 18 at 7:30 am Contact information: [address], [place], [place]. Schedule an appointment as soon as possible for a visit.   Specialty:  Family Medicine Why:  to be seen within 1 week for follow-up of your hospital stay and to have your blood pressure and a lipid panel rechecked. Contact information: [place] [address] [phone]         Office will call pt to schedule Echo, order has been placed.  Discharge Medications   Allergies as of 11/23/2016   No Known Allergies     Medication List    STOP taking these medications   cefdinir 300 MG capsule Commonly known as:  OMNICEF     TAKE these medications   amLODipine 5 MG tablet Commonly known as:  NORVASC Take 1 tablet (5 mg total) by mouth daily.   meclizine 32 MG tablet Commonly known as:  ANTIVERT Take 1 tablet (32 mg total) by mouth 3 (three) times daily as needed.   simvastatin 40 MG tablet Commonly known as:  ZOCOR TAKE 1 TABLET BY MOUTH ONCE EVERY EVENING What  changed:  how much to take  how to take this  when to take this  additional instructions        Outstanding Labs/Studies   2D Echo to be done in 1-2 weeks as outpatient  Duration of Discharge Encounter   Greater than 30 minutes including physician time.  Signed, Linus Mako PA-C 11/23/2016, 2:00 PM

## 2016-11-23 NOTE — Discharge Instructions (Signed)
Healthy Eating Eating healthy is important. When fat builds up in your body, you may become overweight or obese. These conditions put you at greater risk for developing certain health problems, such as heart disease, diabetes, sleeping problems, joint problems, and some cancers. Unhealthy weight gain is often the result of making unhealthy choices in what you eat. It is also a result of not getting enough exercise. You can make changes to your lifestyle to prevent obesity and stay as healthy as possible. What nutrition changes can be made? To maintain a healthy weight and prevent obesity:  Eat only as much as your body needs. To do this:  Pay attention to signs that you are hungry or full. Stop eating as soon as you feel full.  If you feel hungry, try drinking water first. Drink enough water so your urine is clear or pale yellow.  Eat smaller portions.  Look at serving sizes on food labels. Most foods contain more than one serving per container.  Eat the recommended amount of calories for your gender and activity level. While most active people should eat around 2,000 calories per day, if you are trying to lose weight or are not very active, you main need to eat less calories. Talk to your health care provider or dietitian about how many calories you should eat each day.  Choose healthy foods, such as:  Fruits and vegetables. Try to fill at least half of your plate at each meal with fruits and vegetables.  Whole grains, such as whole wheat bread, brown rice, and quinoa.  Lean meats, such as chicken or fish.  Other healthy proteins, such as beans, eggs, or tofu.  Healthy fats, such as nuts, seeds, fatty fish, and olive oil.  Low-fat or fat-free dairy.  Check food labels and avoid food and drinks that:  Are high in calories.  Have added sugar.  Are high in sodium.  Have saturated fats or trans fats.  Limit how much you eat of the following foods:  Prepackaged meals.  Fast  food.  Fried foods.  Processed meat, such as bacon, sausage, and deli meats.  Fatty cuts of red meat and poultry with skin.  Cook foods in healthier ways, such as by baking, broiling, or grilling.  When grocery shopping, try to shop around the outside of the store. This helps you buy mostly fresh foods and avoid canned and prepackaged foods. What lifestyle changes can be made?  Exercise at least 30 minutes 5 or more days each week. Exercising includes brisk walking, yard work, biking, running, swimming, and team sports like basketball and soccer. Ask your health care provider which exercises are safe for you.  Do not use any products that contain nicotine or tobacco, such as cigarettes and e-cigarettes. If you need help quitting, ask your health care provider.  Limit alcohol intake to no more than 1 drink a day for nonpregnant women and 2 drinks a day for men. One drink equals 12 oz of beer, 5 oz of wine, or 1 oz of hard liquor.  Try to get 7-9 hours of sleep each night. What other changes can be made?  Keep a food and activity journal to keep track of:  What you ate and how many calories you had. Remember to count sauces, dressings, and side dishes.  Whether you were active, and what exercises you did.  Your calorie, weight, and activity goals.  Check your weight regularly. Track any changes. If you notice you have gained weight,  make changes to your diet or activity routine.  Avoid taking weight-loss medicines or supplements. Talk to your health care provider before starting any new medicine or supplement.  Talk to your health care provider before trying any new diet or exercise plan. Why are these changes important? Eating healthy, staying active, and having healthy habits not only help prevent obesity, they also:  Help you to manage stress and emotions.  Help you to connect with friends and family.  Improve your self-esteem.  Improve your sleep.  Prevent long-term  health problems. What can happen if changes are not made? Being obese or overweight can cause you to develop joint or bone problems, which can make it hard for you to stay active or do activities you enjoy. Being obese or overweight also puts stress on your heart and lungs and can lead to health problems like diabetes, heart disease, and some cancers. Where to find more information: Talk with your health care provider or a dietitian about healthy eating and healthy lifestyle choices. You may also find other information through these resources:  U.S. Department of Agriculture MyPlate: FormerBoss.no  American Heart Association: www.heart.org  Centers for Disease Control and Prevention: http://www.wolf.info/ Summary  Staying at a healthy weight is important. It helps prevent certain diseases and health problems, such as heart disease, diabetes, joint problems, sleep disorders, and some cancers.  Being obese or overweight can cause you to develop joint or bone problems, which can make it hard for you to stay active or do activities you enjoy.  You can prevent unhealthy weight gain by eating a healthy diet, exercising regularly, not smoking, limiting alcohol, and getting enough sleep.  Talk with your health care provider or a dietitian for guidance about healthy eating and healthy lifestyle choices. This information is not intended to replace advice given to you by your health care provider. Make sure you discuss any questions you have with your health care provider. Document Released: 08/09/2016 Document Revised: 08/09/2016 Document Reviewed: 08/09/2016 Elsevier Interactive Patient Education  2017 [employer].

## 2016-12-07 ENCOUNTER — Ambulatory Visit: Payer: Managed Care, Other (non HMO)

## 2016-12-07 ENCOUNTER — Other Ambulatory Visit (HOSPITAL_COMMUNITY): Payer: Managed Care, Other (non HMO)

## 2016-12-19 ENCOUNTER — Encounter: Payer: Self-pay | Admitting: Family Medicine

## 2017-01-11 ENCOUNTER — Other Ambulatory Visit: Payer: Self-pay | Admitting: Family Medicine

## 2017-01-11 ENCOUNTER — Encounter: Payer: Self-pay | Admitting: Family Medicine

## 2017-01-12 ENCOUNTER — Telehealth: Payer: Self-pay | Admitting: Emergency Medicine

## 2017-01-12 NOTE — Telephone Encounter (Signed)
Called patient x 2 to schedule Echo.  Spoke with patient today and she don't want schedule the test.  She states she is doing fine and don't need the test.

## 2017-01-13 ENCOUNTER — Encounter: Payer: Self-pay | Admitting: Family Medicine

## 2017-01-13 ENCOUNTER — Ambulatory Visit (INDEPENDENT_AMBULATORY_CARE_PROVIDER_SITE_OTHER): Payer: Managed Care, Other (non HMO) | Admitting: Family Medicine

## 2017-01-13 VITALS — BP 136/88 | HR 74 | Temp 98.2°F | Resp 16 | Ht 62.0 in | Wt 177.0 lb

## 2017-01-13 DIAGNOSIS — E7800 Pure hypercholesterolemia, unspecified: Secondary | ICD-10-CM | POA: Diagnosis not present

## 2017-01-13 DIAGNOSIS — Z09 Encounter for follow-up examination after completed treatment for conditions other than malignant neoplasm: Secondary | ICD-10-CM | POA: Diagnosis not present

## 2017-01-13 DIAGNOSIS — E781 Pure hyperglyceridemia: Secondary | ICD-10-CM | POA: Diagnosis not present

## 2017-01-13 MED ORDER — SIMVASTATIN 40 MG PO TABS: ORAL_TABLET | ORAL | 3 refills | Status: DC

## 2017-01-13 NOTE — Progress Notes (Signed)
   Subjective:    Patient ID: Stephanie [name], female    DOB: 06-13-1966, 51 y.o.   MRN: [id number]  HPI The patient was admitted to the hospital April 3-4 with atypical chest pain. Nuclear stress test at that time revealed no reversible ischemia and ejection fraction greater than 70%. Cardiology discharged her home with the thought tthat this is likely stress related. They did recommend an outpatient echocardiogram and follow-up with me if symptoms persisted. She is here today for follow-up.  I reviewed her discharge summary in its entirety as well as her stress test performed in the hospital. We have reconciled her medication list.  After having a long discussion today with the patient, she is under tremendous stress. She is the sole caregiver for her mother. Her brother was stealing money from his mother and therefore no one helps her care for her mother who has dementia. She is also working full-time. She states she is on the go nonstop and never has a moment for herself. She denies any further chest pain shortness of breath palpitations dyspnea on exertion or syncope.  Past Medical History:  Diagnosis Date  . High cholesterol   . Hyperlipidemia   . Hypertension    Past Surgical History:  Procedure Laterality Date  . CESAREAN SECTION    . CHOLECYSTECTOMY    . NASAL SINUS SURGERY  2017   Dr.[facility]   Current Outpatient Prescriptions on File Prior to Visit  Medication Sig Dispense Refill  . amLODipine (NORVASC) 5 MG tablet Take 1 tablet (5 mg total) by mouth daily. 30 tablet 2  . simvastatin (ZOCOR) 40 MG tablet TAKE 1 TABLET BY MOUTH ONCE EVERY EVENING (Patient taking differently: Take 40 mg by mouth daily at 6 PM. ) 90 tablet 3   No current facility-administered medications on file prior to visit.    No Known Allergies Social History   Social History  . Marital status: Married    Spouse name: N/A  . Number of children: N/A  . Years of education: N/A   Occupational History   . Not on file.   Social History Main Topics  . Smoking status: Never Smoker  . Smokeless tobacco: Never Used  . Alcohol use No  . Drug use: Unknown  . Sexual activity: Yes     Comment: divorced   Other Topics Concern  . Not on file   Social History Narrative   ** Merged History Encounter **          Review of Systems  All other systems reviewed and are negative.      Objective:   Physical Exam  Neck: No JVD present.  Cardiovascular: Normal rate, regular rhythm and normal heart sounds.   No murmur heard. Pulmonary/Chest: Effort normal and breath sounds normal. No respiratory distress. She has no wheezes. She has no rales.  Abdominal: Soft. Bowel sounds are normal.  Musculoskeletal: She exhibits no edema.  Vitals reviewed.         Assessment & Plan:  Hospital discharge follow-up, hypertriglyceridemia/hyperlipidemia  I believe the patient had a panic attack. Spent 20 minutes discussing management options with the patient. Recommended that she pursue an assisted living situation for her mother to help offload some distress from her. Take simvastatin on a daily basis and recheck fasting lipid panel in 6 weeks.

## 2017-01-23 ENCOUNTER — Encounter: Payer: Self-pay | Admitting: Family Medicine

## 2017-02-01 ENCOUNTER — Encounter: Payer: Self-pay | Admitting: Family Medicine

## 2017-02-02 ENCOUNTER — Other Ambulatory Visit: Payer: Self-pay | Admitting: Family Medicine

## 2017-02-02 MED ORDER — AMOXICILLIN 875 MG PO TABS: 875.0000 mg | ORAL_TABLET | Freq: Two times a day (BID) | ORAL | 0 refills | Status: DC

## 2017-02-13 ENCOUNTER — Telehealth: Payer: Self-pay | Admitting: Family Medicine

## 2017-02-14 ENCOUNTER — Encounter: Payer: Self-pay | Admitting: Family Medicine

## 2017-02-14 ENCOUNTER — Ambulatory Visit (INDEPENDENT_AMBULATORY_CARE_PROVIDER_SITE_OTHER): Payer: Managed Care, Other (non HMO) | Admitting: Family Medicine

## 2017-02-14 VITALS — BP 136/70 | HR 72 | Temp 98.0°F | Resp 14 | Ht 62.0 in | Wt 172.0 lb

## 2017-02-14 DIAGNOSIS — F4323 Adjustment disorder with mixed anxiety and depressed mood: Secondary | ICD-10-CM | POA: Diagnosis not present

## 2017-02-14 DIAGNOSIS — G43809 Other migraine, not intractable, without status migrainosus: Secondary | ICD-10-CM

## 2017-02-14 MED ORDER — KETOROLAC TROMETHAMINE 60 MG/2ML IM SOLN
60.0000 mg | Freq: Once | INTRAMUSCULAR | Status: AC
  Administered 2017-02-14: 60 mg via INTRAMUSCULAR

## 2017-02-14 MED ORDER — PROMETHAZINE HCL 12.5 MG PO TABS: 12.5000 mg | ORAL_TABLET | Freq: Three times a day (TID) | ORAL | 0 refills | Status: DC | PRN

## 2017-02-14 MED ORDER — SUMATRIPTAN SUCCINATE 50 MG PO TABS: 50.0000 mg | ORAL_TABLET | ORAL | 0 refills | Status: DC | PRN

## 2017-02-14 NOTE — Patient Instructions (Addendum)
Call for FMLA, you need for intermittent so you can attend appointments/therapy [phone] [facility] -  [phone] F/U 8 weeks

## 2017-02-14 NOTE — Progress Notes (Signed)
    Subjective:    Patient ID: Stephanie [name], female    DOB: 11-20-1965, 51 y.o.   MRN: [id number]  Patient presents for Headache (x1 day- states that she woke up with HA on 6/25- has been using IBU with no relief) and Stress (increased anxiety)  Patient here with headache and stress. 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 She finds it very difficult especially when her mother has mood swings try to help take care of her as well as work. She is contemplating decreasing her hours or quitting her job which is very stressful as well. She is to take her mother to appointments as well as care for her at home and provide her with transportation. She is looking into FMLA to assist with some of this. She also like to speak to a therapist. He wants to avoid medication if possible  She has history of underlying migraines she will cut this morning with headache she try using some ibuprofen but this has not helped. She does not feel sick at all. She knows it is for stress related. No change in her vision but she did have some mild nausea.  Mother had appt 6/25  Review Of Systems:  GEN- denies fatigue, fever, weight loss,weakness, recent illness HEENT- denies eye drainage, change in vision, nasal discharge, CVS- denies chest pain, palpitations RESP- denies SOB, cough, wheeze ABD- denies N/V, change in stools, abd pain GU- denies dysuria, hematuria, dribbling, incontinence MSK- denies joint pain, muscle aches, injury Neuro- + headache, dizziness, syncope, seizure activity       Objective:    BP 136/70   Pulse 72   Temp 98 F (36.7 C) (Oral)   Resp 14   Ht 5\' 2"  (1.575 m)   Wt 172 lb (78 kg)   SpO2 98%   BMI 31.46 kg/m  GEN- NAD, alert and oriented x3 HEENT- PERRL, EOMI, non injected sclera, pink conjunctiva, MMM, oropharynx clear Neck- Supple, no thyromegaly CVS- RRR, no murmur RESP-CTAB Neuro-CNII-XII in tact, no deficits Psych- stressed appearing,  tearful at times, no SI, well groomed, good eye contact  EXT- No edema Pulses- Radial, DP- 2+        Assessment & Plan:      Problem List Items Addressed This Visit    None    Visit Diagnoses    Other migraine without status migrainosus, not intractable    -  Primary   Given imitrex prn migraine, toradol injection in office    Relevant Medications   SUMAtriptan (IMITREX) 50 MG tablet   ketorolac (TORADOL) injection 60 mg (Completed)   Situational mixed anxiety and depressive disorder       Referred to therapy, very difficult being caregiver for mother with progressive dementia. She will send me forms for FMLA so she can continue to assit mother      Note: This dictation was prepared with Dragon dictation along with smaller phrase technology. Any transcriptional errors that result from this process are unintentional.

## 2017-02-15 ENCOUNTER — Encounter: Payer: Self-pay | Admitting: Family Medicine

## 2017-02-16 ENCOUNTER — Telehealth: Payer: Self-pay | Admitting: *Deleted

## 2017-02-16 NOTE — Telephone Encounter (Signed)
Your information has been submitted and will be reviewed by [employer]. You may close this dialog, return to your dashboard, and perform other tasks. An electronic determination will be received in CoverMyMeds within 72-120 hours. You can see the latest determination by locating this request on your dashboard or by reopening this request. You will receive a fax copy of the determination. If [employer] has not responded in 120 hours, contact [employer] at [phone]. 

## 2017-02-16 NOTE — Telephone Encounter (Signed)
Received request from pharmacy for PA on Imitrex.   PA submitted.   Dx: G43.809- Migraine.

## 2017-02-24 MED ORDER — SUMATRIPTAN SUCCINATE 50 MG PO TABS: 50.0000 mg | ORAL_TABLET | ORAL | 0 refills | Status: DC | PRN

## 2017-02-24 NOTE — Telephone Encounter (Signed)
Received PA determination.   PA denied.   Stephanie [name] has quantity limit on Imitrex for 9 tablets per 30 days.   Re-submitted prescription for #9 tabs.

## 2017-02-27 ENCOUNTER — Encounter: Payer: Self-pay | Admitting: Family Medicine

## 2017-03-27 ENCOUNTER — Telehealth: Payer: Self-pay | Admitting: Family Medicine

## 2017-03-27 NOTE — Telephone Encounter (Signed)
Pt is going on vacation in October and her mother in law will need either in home care or somewhere to go like an assisted living, are there any places that we can suggest?

## 2017-03-28 NOTE — Telephone Encounter (Signed)
Called and spoke to pt and informed her to call around and see what she could find. She states that she has called a few places and they require her to be there a month.

## 2017-04-12 ENCOUNTER — Telehealth: Payer: Self-pay | Admitting: Family Medicine

## 2017-04-12 NOTE — Telephone Encounter (Signed)
New Message  Pt voiced someone from our office called her and no one left a message.  Looked up all pts associated with this number and nothing shows as to why we would call.  Pt voiced it was okay to send message to nurse.

## 2017-04-13 NOTE — Telephone Encounter (Signed)
I do not see that we have called this pt.

## 2017-04-25 ENCOUNTER — Encounter: Payer: Self-pay | Admitting: Family Medicine

## 2017-05-02 ENCOUNTER — Ambulatory Visit (INDEPENDENT_AMBULATORY_CARE_PROVIDER_SITE_OTHER): Payer: Managed Care, Other (non HMO) | Admitting: *Deleted

## 2017-05-02 DIAGNOSIS — Z23 Encounter for immunization: Secondary | ICD-10-CM

## 2017-05-02 NOTE — Progress Notes (Signed)
Patient seen in office for Influenza Vaccination.   Tolerated IM administration well.   Immunization history updated.  

## 2017-05-05 ENCOUNTER — Ambulatory Visit (INDEPENDENT_AMBULATORY_CARE_PROVIDER_SITE_OTHER): Payer: Managed Care, Other (non HMO) | Admitting: Family Medicine

## 2017-05-05 ENCOUNTER — Ambulatory Visit: Payer: Managed Care, Other (non HMO) | Admitting: Family Medicine

## 2017-05-05 ENCOUNTER — Encounter: Payer: Self-pay | Admitting: Family Medicine

## 2017-05-05 VITALS — BP 130/72 | HR 84 | Temp 98.1°F | Resp 14 | Ht 62.0 in | Wt 174.0 lb

## 2017-05-05 DIAGNOSIS — Z719 Counseling, unspecified: Secondary | ICD-10-CM

## 2017-05-05 DIAGNOSIS — T50B95A Adverse effect of other viral vaccines, initial encounter: Secondary | ICD-10-CM

## 2017-05-05 NOTE — Progress Notes (Signed)
Subjective:    Patient ID: Stephanie [name], female    DOB: 01-09-1966, 51 y.o.   MRN: [id number]  HPI Patient is here today to discuss her mother's condition. Her mother has Alzheimer's disease. This is rapidly progressing. The patient and her husband have a trip planned and need respite care for 2-3 weeks. They have contacted [facility] and have the requisite paperwork for me to complete so that they can go on this trip. Today, I spent 30 minutes with the patient completing FL-2 forms, dietary forms, etc so that the patient can be placed in a skilled nursing facility for respite care for 2-4 weeks until the patient returns from her trip. The patient is also concerned about a reaction to her flu shot. On her right hip, there is a erythematous indurated patch where she received a flu shot that is 19 cm x 9 cm. It is slightly warm to the touch. It is nontender but it is swollen. It is similar to a large hive and appears to be a localized vigorous immune response to the flu vaccine Past Medical History:  Diagnosis Date  . High cholesterol   . Hyperlipidemia   . Hypertension    Past Surgical History:  Procedure Laterality Date  . CESAREAN SECTION    . CHOLECYSTECTOMY    . NASAL SINUS SURGERY  2017   Dr.[facility]   Current Outpatient Prescriptions on File Prior to Visit  Medication Sig Dispense Refill  . promethazine (PHENERGAN) 12.5 MG tablet Take 1 tablet (12.5 mg total) by mouth every 8 (eight) hours as needed for nausea or vomiting. 20 tablet 0  . simvastatin (ZOCOR) 40 MG tablet TAKE 1 TABLET BY MOUTH ONCE EVERY EVENING 90 tablet 3  . SUMAtriptan (IMITREX) 50 MG tablet Take 1 tablet (50 mg total) by mouth every 2 (two) hours as needed for migraine. May repeat in 2 hours if headache persists or recurs. 9 tablet 0   No current facility-administered medications on file prior to visit.    No Known Allergies Social History   Social History  . Marital status: Married    Spouse name:  N/A  . Number of children: N/A  . Years of education: N/A   Occupational History  . Not on file.   Social History Main Topics  . Smoking status: Never Smoker  . Smokeless tobacco: Never Used  . Alcohol use No  . Drug use: Unknown  . Sexual activity: Yes     Comment: divorced   Other Topics Concern  . Not on file   Social History Narrative   ** Merged History Encounter **          Review of Systems  All other systems reviewed and are negative.      Objective:   Physical Exam  Cardiovascular: Normal rate and regular rhythm.   Pulmonary/Chest: Effort normal and breath sounds normal. No stridor. No respiratory distress. She has no wheezes. She has no rales.  Skin: Rash noted. There is erythema.     Vitals reviewed.         Assessment & Plan:  Consultation without specific complaint  Spent 30 minutes with the patient completing the necessary forms so that her mother can be admitted temporarily into the nursing home for respite care. I believe she is having a local exaggerated immune response to her flu shot. This does not appear to be cellulitis. If worsening, I could give the patient a prednisone taper pack which would  calm the immune response. Patient believes that the erythema and pain and warmth are subsiding gradually and she elects not to do this at the present time.

## 2017-05-16 ENCOUNTER — Ambulatory Visit (INDEPENDENT_AMBULATORY_CARE_PROVIDER_SITE_OTHER): Payer: Managed Care, Other (non HMO) | Admitting: Orthopaedic Surgery

## 2017-05-16 ENCOUNTER — Ambulatory Visit (INDEPENDENT_AMBULATORY_CARE_PROVIDER_SITE_OTHER): Payer: Managed Care, Other (non HMO)

## 2017-05-16 DIAGNOSIS — M25561 Pain in right knee: Secondary | ICD-10-CM

## 2017-05-16 DIAGNOSIS — M1711 Unilateral primary osteoarthritis, right knee: Secondary | ICD-10-CM

## 2017-05-16 DIAGNOSIS — G8929 Other chronic pain: Secondary | ICD-10-CM

## 2017-05-16 DIAGNOSIS — M1712 Unilateral primary osteoarthritis, left knee: Secondary | ICD-10-CM

## 2017-05-16 MED ORDER — LIDOCAINE HCL 1 % IJ SOLN
2.0000 mL | INTRAMUSCULAR | Status: AC | PRN
  Administered 2017-05-16: 2 mL

## 2017-05-16 MED ORDER — BUPIVACAINE HCL 0.5 % IJ SOLN
2.0000 mL | INTRAMUSCULAR | Status: AC | PRN
  Administered 2017-05-16: 2 mL via INTRA_ARTICULAR

## 2017-05-16 MED ORDER — METHYLPREDNISOLONE ACETATE 40 MG/ML IJ SUSP
40.0000 mg | INTRAMUSCULAR | Status: AC | PRN
  Administered 2017-05-16: 40 mg via INTRA_ARTICULAR

## 2017-05-16 NOTE — Progress Notes (Signed)
Office Visit Note   Patient: Stephanie [name]           Date of Birth: [birth year]           MRN: [id number] Visit Date: 05/16/2017              Requested by: Susy Frizzle, MD [id number] [place],  [id number] PCP: Susy Frizzle, MD   Assessment & Plan: Visit Diagnoses:  1. Chronic pain of right knee   2. Unilateral primary osteoarthritis, left knee   3. Unilateral primary osteoarthritis, right knee     Plan: Overall impression is bilateral knee osteoarthritis worse on the right. Bilateral knee cortisone injection performed today. Questions encouraged and answered. Follow-up as needed.  Follow-Up Instructions: Return if symptoms worsen or fail to improve.   Orders:  Orders Placed This Encounter  Procedures  . XR KNEE 3 VIEW RIGHT   No orders of the defined types were placed in this encounter.     Procedures: Large Joint Inj Date/Time: 05/16/2017 11:36 AM Performed by: Leandrew Koyanagi Authorized by: Leandrew Koyanagi   Consent Given by:  Patient Timeout: prior to procedure the correct patient, procedure, and site was verified   Indications:  Pain Location:  Knee Site:  R knee Prep: patient was prepped and draped in usual sterile fashion   Needle Size:  22 G Approach:  Lateral Ultrasound Guidance: No   Fluoroscopic Guidance: No   Arthrogram: No   Medications:  2 mL bupivacaine 0.5 %; 40 mg methylPREDNISolone acetate 40 MG/ML; 2 mL lidocaine 1 % Patient tolerance:  Patient tolerated the procedure well with no immediate complications Large Joint Inj Date/Time: 05/16/2017 11:37 AM Performed by: Leandrew Koyanagi Authorized by: Leandrew Koyanagi   Consent Given by:  Patient Timeout: prior to procedure the correct patient, procedure, and site was verified   Indications:  Pain Location:  Knee Site:  L knee Prep: patient was prepped and draped in usual sterile fashion   Needle Size:  22 G Ultrasound Guidance: No   Fluoroscopic Guidance: No   Arthrogram: No    Medications:  2 mL lidocaine 1 %; 2 mL bupivacaine 0.5 %; 40 mg methylPREDNISolone acetate 40 MG/ML Patient tolerance:  Patient tolerated the procedure well with no immediate complications     Clinical Data: No additional findings.   Subjective: Chief Complaint  Patient presents with  . Right Knee - Pain    Patient is a 51 year old female who I saw several years ago for right knee osteoarthritis who greatly improved after a cortisone injection. She comes in today with bilateral knee pain worse on the right. She complains of chronic aching pain that is moderate. Advil is partial and temporary relief. She endorses some mild swelling and occasional radiation of pain down into the toes. Denies any back pain.    Review of Systems  Constitutional: Negative.   HENT: Negative.   Eyes: Negative.   Respiratory: Negative.   Cardiovascular: Negative.   Endocrine: Negative.   Musculoskeletal: Negative.   Neurological: Negative.   Hematological: Negative.   Psychiatric/Behavioral: Negative.   All other systems reviewed and are negative.    Objective: Vital Signs: There were no vitals taken for this visit.  Physical Exam  Constitutional: She is oriented to person, place, and time. She appears well-developed and well-nourished.  Pulmonary/Chest: Effort normal.  Neurological: She is alert and oriented to person, place, and time.  Skin: Skin is warm. Capillary  refill takes less than 2 seconds.  Psychiatric: She has a normal mood and affect. Her behavior is normal. Judgment and thought content normal.  Nursing note and vitals reviewed.   Ortho Exam Bilateral knee exam shows no joint effusion. Normal range of motion. Mild patellar crepitus. Specialty Comments:  No specialty comments available.  Imaging: Xr Knee 3 View Right  Result Date: 05/16/2017 Mild osteoarthritis    PMFS History: Patient Active Problem List   Diagnosis Date Noted  . Unilateral primary osteoarthritis,  left knee 05/16/2017  . Unilateral primary osteoarthritis, right knee 05/16/2017  . Chest pain 11/22/2016  . Hyperlipidemia   . Hypertension   . STRICTURE AND STENOSIS OF ESOPHAGUS 02/24/2009  . GERD 02/12/2009  . NAUSEA AND VOMITING 02/12/2009  . ABDOMINAL PAIN-EPIGASTRIC 02/12/2009   Past Medical History:  Diagnosis Date  . High cholesterol   . Hyperlipidemia   . Hypertension     Family History  Problem Relation Age of Onset  . Hypertension Mother   . Hyperlipidemia Mother   . Heart disease Father   . Hyperlipidemia Brother   . Hypertension Brother   . Cancer Maternal Grandmother        brain tumor  . Cancer Maternal Grandfather        retinoblastoma    Past Surgical History:  Procedure Laterality Date  . CESAREAN SECTION    . CHOLECYSTECTOMY    . NASAL SINUS SURGERY  2017   Dr.Byers [facility]   Social History   Occupational History  . Not on file.   Social History Main Topics  . Smoking status: Never Smoker  . Smokeless tobacco: Never Used  . Alcohol use No  . Drug use: Unknown  . Sexual activity: Yes     Comment: divorced

## 2017-05-23 ENCOUNTER — Encounter: Payer: Self-pay | Admitting: Family Medicine

## 2017-06-16 ENCOUNTER — Encounter: Payer: Self-pay | Admitting: Family Medicine

## 2017-06-16 ENCOUNTER — Ambulatory Visit (INDEPENDENT_AMBULATORY_CARE_PROVIDER_SITE_OTHER): Payer: Managed Care, Other (non HMO) | Admitting: Family Medicine

## 2017-06-16 VITALS — BP 130/72 | HR 80 | Temp 97.8°F | Resp 16 | Ht 62.0 in | Wt 176.0 lb

## 2017-06-16 DIAGNOSIS — R309 Painful micturition, unspecified: Secondary | ICD-10-CM

## 2017-06-16 DIAGNOSIS — R319 Hematuria, unspecified: Secondary | ICD-10-CM

## 2017-06-16 LAB — URINALYSIS, ROUTINE W REFLEX MICROSCOPIC
Bilirubin Urine: NEGATIVE
GLUCOSE, UA: NEGATIVE
KETONES UR: NEGATIVE
NITRITE: NEGATIVE
PH: 6.5 (ref 5.0–8.0)
SPECIFIC GRAVITY, URINE: 1.02 (ref 1.001–1.03)

## 2017-06-16 LAB — MICROSCOPIC MESSAGE

## 2017-06-16 MED ORDER — CIPROFLOXACIN HCL 500 MG PO TABS: 500.0000 mg | ORAL_TABLET | Freq: Two times a day (BID) | ORAL | 0 refills | Status: DC

## 2017-06-16 NOTE — Progress Notes (Signed)
   Subjective:    Patient ID: Stephanie [name], female    DOB: June 21, 1966, 51 y.o.   MRN: [id number]  HPI Patient recently went on a long vacation with her husband to [place] [facility].  Shortly upon returning home, she developed pain in her pelvis over her bladder.  She developed dysuria.  She developed frequency and urgency.  She also developed trace hematuria.  She would notice trace amounts of blood on the toilet tissue after she would wipe.  She states it burns like fire every time she has to urinate.  She feels constant pressure.  She reports urgency yet then hesitancy.  Urinalysis today shows +2 leukocyte esterase, +1 blood. Past Medical History:  Diagnosis Date  . High cholesterol   . Hyperlipidemia   . Hypertension    Past Surgical History:  Procedure Laterality Date  . CESAREAN SECTION    . CHOLECYSTECTOMY    . NASAL SINUS SURGERY  2017   Dr.Byers [facility]   Current Outpatient Prescriptions on File Prior to Visit  Medication Sig Dispense Refill  . simvastatin (ZOCOR) 40 MG tablet TAKE 1 TABLET BY MOUTH ONCE EVERY EVENING 90 tablet 3   No current facility-administered medications on file prior to visit.    No Known Allergies Social History   Social History  . Marital status: Married    Spouse name: N/A  . Number of children: N/A  . Years of education: N/A   Occupational History  . Not on file.   Social History Main Topics  . Smoking status: Never Smoker  . Smokeless tobacco: Never Used  . Alcohol use No  . Drug use: Unknown  . Sexual activity: Yes     Comment: divorced   Other Topics Concern  . Not on file   Social History Narrative   ** Merged History Encounter **          Review of Systems  All other systems reviewed and are negative.      Objective:   Physical Exam  Constitutional: She appears well-developed and well-nourished.  Cardiovascular: Normal rate, regular rhythm and normal heart sounds.   Pulmonary/Chest: Effort normal and breath  sounds normal. No respiratory distress. She has no wheezes. She has no rales.  Abdominal: Soft. Bowel sounds are normal. She exhibits no distension. There is no tenderness. There is no rebound.  Vitals reviewed.         Assessment & Plan:  Hematuria, unspecified type - Plan: Urinalysis, Routine w reflex microscopic  Pain with urination - Plan: Urinalysis, Routine w reflex microscopic  Tendons are consistent with a urinary tract infection.  I will start the patient on Cipro 500 mg p.o. twice daily for 3-5 days depending upon symptom resolution.  Push plenty of fluids.  Recheck if symptoms are not better by next week

## 2017-06-19 ENCOUNTER — Telehealth: Payer: Self-pay | Admitting: Family Medicine

## 2017-06-19 MED ORDER — FLUCONAZOLE 150 MG PO TABS: 150.0000 mg | ORAL_TABLET | Freq: Once | ORAL | 0 refills | Status: AC

## 2017-06-19 MED ORDER — SULFAMETHOXAZOLE-TRIMETHOPRIM 800-160 MG PO TABS: 1.0000 | ORAL_TABLET | Freq: Two times a day (BID) | ORAL | 0 refills | Status: DC

## 2017-06-19 NOTE — Telephone Encounter (Signed)
Patient is no better and dr pickard said to call if she was no better please call her at [phone]

## 2017-06-19 NOTE — Telephone Encounter (Signed)
swithc to bactrim ds po bid for 5 days in case she had a resistant bacteria.

## 2017-06-19 NOTE — Telephone Encounter (Signed)
Pt aware and med sent to pharm 

## 2017-06-22 ENCOUNTER — Encounter: Payer: Self-pay | Admitting: Family Medicine

## 2017-07-26 ENCOUNTER — Encounter: Payer: Self-pay | Admitting: Family Medicine

## 2017-08-23 ENCOUNTER — Other Ambulatory Visit: Payer: Self-pay

## 2017-08-23 ENCOUNTER — Encounter: Payer: Self-pay | Admitting: Family Medicine

## 2017-08-23 ENCOUNTER — Ambulatory Visit (INDEPENDENT_AMBULATORY_CARE_PROVIDER_SITE_OTHER): Payer: Managed Care, Other (non HMO) | Admitting: Family Medicine

## 2017-08-23 ENCOUNTER — Ambulatory Visit (HOSPITAL_COMMUNITY)
Admission: RE | Admit: 2017-08-23 | Discharge: 2017-08-23 | Disposition: A | Discharge status: Home / Self Care | Payer: Managed Care, Other (non HMO) | Source: Ambulatory Visit | Attending: Family Medicine | Admitting: Family Medicine

## 2017-08-23 VITALS — BP 122/64 | HR 70 | Temp 98.1°F | Resp 16 | Ht 62.0 in | Wt 176.0 lb

## 2017-08-23 DIAGNOSIS — J209 Acute bronchitis, unspecified: Secondary | ICD-10-CM

## 2017-08-23 DIAGNOSIS — J040 Acute laryngitis: Secondary | ICD-10-CM | POA: Diagnosis not present

## 2017-08-23 MED ORDER — PREDNISONE 10 MG PO TABS: ORAL_TABLET | ORAL | 0 refills | Status: DC

## 2017-08-23 MED ORDER — HYDROCOD POLST-CPM POLST ER 10-8 MG/5ML PO SUER: 5.0000 mL | Freq: Two times a day (BID) | ORAL | 0 refills | Status: DC | PRN

## 2017-08-23 NOTE — Progress Notes (Signed)
   Subjective:    Patient ID: Stephanie [name], female    DOB: 03-Jun-1966, 52 y.o.   MRN: [id number]  Patient presents for Cough (x1 month- nonproductive cough, low grade fever, rib pain from cough)   Pt here with cough with minimal production for the past month.  She initially had some low-grade fever as well.  She now has some pain in the center of her chest that will radiate towards her back when she coughs.  She denies any shortness of breath or wheezing.  She has been on 2 rounds of amoxicillin secondary to a root canal.  She denies any sinus symptoms but has used multiple over-the-counter medications to help.  She has used Mucinex, Robitussin, Flonase, Sudafed, antihistamine.  The cough is keeping her up at night.  She also now has a hoarse voice for the past week or so.   Review Of Systems:  GEN- denies fatigue, fever, weight loss,weakness, recent illness HEENT- denies eye drainage, change in vision, nasal discharge, CVS- denies chest pain, palpitations RESP- denies SOB,+ cough, wheeze ABD- denies N/V, change in stools, abd pain GU- denies dysuria, hematuria, dribbling, incontinence MSK- denies joint pain, muscle aches, injury Neuro- denies headache, dizziness, syncope, seizure activity       Objective:    BP 122/64   Pulse 70   Temp 98.1 F (36.7 C) (Oral)   Resp 16   Ht 5\' 2"  (1.575 m)   Wt 176 lb (79.8 kg)   SpO2 96%   BMI 32.19 kg/m  GEN- NAD, alert and oriented x3 HEENT- PERRL, EOMI, non injected sclera, pink conjunctiva, MMM, oropharynx clear, hoase voice , TM clear bilat no effusion,  No maxillary sinus tenderness, inflammed turbinates,  Nasal drainage  Neck- Supple, no LAD CVS- RRR, no murmur RESP-CTAB, bronchitic sounding cough, no rales, normal WOB no retractions Chest wall- TTP mid chest wall EXT- No edema Pulses- Radial 2+         Assessment & Plan:      Problem List Items Addressed This Visit    None    Visit Diagnoses    Acute bronchitis,  unspecified organism    -  Primary   Obtain CXR with 1 month of symptoms and 2 rounds of antibiotics, r/o atypical infiltrate, will also give prednisone and tussionex to help symptoms   Relevant Orders   DG Chest 2 View   Acute laryngitis          Note: This dictation was prepared with Dragon dictation along with smaller phrase technology. Any transcriptional errors that result from this process are unintentional.

## 2017-08-23 NOTE — Patient Instructions (Signed)
F/U pending results Get Chest xray

## 2017-09-05 ENCOUNTER — Other Ambulatory Visit: Payer: Self-pay

## 2017-09-05 ENCOUNTER — Ambulatory Visit: Payer: Managed Care, Other (non HMO) | Admitting: Family Medicine

## 2017-09-05 ENCOUNTER — Encounter: Payer: Self-pay | Admitting: Family Medicine

## 2017-09-05 VITALS — BP 130/68 | HR 82 | Temp 97.7°F | Resp 18 | Ht 62.0 in | Wt 177.0 lb

## 2017-09-05 DIAGNOSIS — J209 Acute bronchitis, unspecified: Secondary | ICD-10-CM

## 2017-09-05 DIAGNOSIS — M940 Chondrocostal junction syndrome [Tietze]: Secondary | ICD-10-CM | POA: Diagnosis not present

## 2017-09-05 DIAGNOSIS — R5383 Other fatigue: Secondary | ICD-10-CM | POA: Diagnosis not present

## 2017-09-05 LAB — CBC
HCT: 42.5 % (ref 35.0–45.0)
Hemoglobin: 14.9 g/dL (ref 11.7–15.5)
MCH: 32.3 pg (ref 27.0–33.0)
MCHC: 35.1 g/dL (ref 32.0–36.0)
MCV: 92 fL (ref 80.0–100.0)
PLATELETS: 252 10*3/uL (ref 140–400)
RBC: 4.62 10*6/uL (ref 3.80–5.10)
RDW: 12.8 % (ref 11.0–15.0)
WBC: 9.6 10*3/uL (ref 3.8–10.8)

## 2017-09-05 MED ORDER — PREDNISONE 10 MG PO TABS: ORAL_TABLET | ORAL | 0 refills | Status: DC

## 2017-09-05 NOTE — Progress Notes (Signed)
   Subjective:    Patient ID: Stephanie [name], female    DOB: [birth year], 52 y.o.   MRN: [id number]  Patient presents for Illness (has been seen for bronchitis- tx with prednisone- fatigue, chest tightness, low grade fever)  She is here with ongoing chest congestion.  She was seen on January 2 at that time diagnosed acute bronchitis.  I gave her prednisone taper as well as Tussionex.  She did chest x-ray which was negative.  She is already been on 2 round of antibiotics amoxicillin from her dentist.  She is a non-smoker.  She has been under significant stress as she worries still even though she has placed her mother in a skilled nursing facility.She is seeing therapist for this twice a month No wheeze   Has has some chest pain especially with cough across chest and lower ribs below breast. No fever. Ibuprofen, tussionex, cough medicine helps,still using humidifer  Had energy while on steroids , now feels zonked  No weight loss     Review Of Systems:  GEN- + fatigue, denies  fever, weight loss,weakness, recent illness HEENT- denies eye drainage, change in vision, nasal discharge, CVS- denies chest pain, palpitations RESP- denies SOB, +cough, wheeze ABD- denies N/V, change in stools, abd pain GU- denies dysuria, hematuria, dribbling, incontinence MSK- +joint pain, +muscle aches, injury Neuro- denies headache, dizziness, syncope, seizure activity       Objective:    BP 130/68   Pulse 82   Temp 97.7 F (36.5 C) (Oral)   Resp 18   Ht 5\' 2"  (1.575 m)   Wt 177 lb (80.3 kg)   SpO2 98%   BMI 32.37 kg/m  GEN- NAD, alert and oriented x3 HEENT- PERRL, EOMI, non injected sclera, pink conjunctiva, MMM, oropharynx clear Neck- Supple, no thyromegaly Chest wall- TTP across chest wall and and on ribs  CVS- RRR, no murmur RESP-CTAB ABD-NABS,soft,NT,ND Psych- normal affect and mood  EXT- No edema Pulses- Radial 2+        Assessment & Plan:      Problem List Items Addressed This  Visit    None    Visit Diagnoses    Acute bronchitis, unspecified organism    -  Primary   Residual cough, continue cough medicine, labs reassuring, did improve with the prednisone, will give 1 more round. For fatigue, I think MTF wiht stressors ,illness lingering CBC was normal in office   Relevant Orders   CBC   Comprehensive metabolic panel   Other fatigue       check labs per above   Relevant Orders   CBC   Comprehensive metabolic panel   TSH   Costochondritis       MSK inflammation ffrom weeks of coughing       Note: This dictation was prepared with Dragon dictation along with smaller phrase technology. Any transcriptional errors that result from this process are unintentional.

## 2017-09-05 NOTE — Patient Instructions (Signed)
Take the steroid round 1 more time We will call with other lab results F/U pending results

## 2017-09-06 LAB — EXTRA LAV TOP TUBE

## 2017-09-06 LAB — COMPREHENSIVE METABOLIC PANEL
AG Ratio: 1.3 (calc) (ref 1.0–2.5)
ALBUMIN MSPROF: 4.3 g/dL (ref 3.6–5.1)
ALT: 60 U/L — AB (ref 6–29)
AST: 62 U/L — ABNORMAL HIGH (ref 10–35)
Alkaline phosphatase (APISO): 75 U/L (ref 33–130)
BUN: 8 mg/dL (ref 7–25)
CO2: 29 mmol/L (ref 20–32)
Calcium: 9.9 mg/dL (ref 8.6–10.4)
Chloride: 101 mmol/L (ref 98–110)
Creat: 0.71 mg/dL (ref 0.50–1.05)
GLUCOSE: 108 mg/dL — AB (ref 65–99)
Globulin: 3.2 g/dL (calc) (ref 1.9–3.7)
POTASSIUM: 3.9 mmol/L (ref 3.5–5.3)
SODIUM: 139 mmol/L (ref 135–146)
TOTAL PROTEIN: 7.5 g/dL (ref 6.1–8.1)
Total Bilirubin: 0.4 mg/dL (ref 0.2–1.2)

## 2017-09-06 LAB — TSH: TSH: 1.87 mIU/L

## 2017-09-07 ENCOUNTER — Other Ambulatory Visit: Payer: Self-pay | Admitting: *Deleted

## 2017-09-07 DIAGNOSIS — E785 Hyperlipidemia, unspecified: Secondary | ICD-10-CM

## 2017-09-07 DIAGNOSIS — R7989 Other specified abnormal findings of blood chemistry: Secondary | ICD-10-CM

## 2017-09-07 DIAGNOSIS — R945 Abnormal results of liver function studies: Principal | ICD-10-CM

## 2017-09-07 MED ORDER — SIMVASTATIN 40 MG PO TABS: ORAL_TABLET | ORAL | 3 refills | Status: DC

## 2017-09-08 ENCOUNTER — Encounter: Payer: Self-pay | Admitting: Family Medicine

## 2017-09-26 ENCOUNTER — Other Ambulatory Visit: Payer: Managed Care, Other (non HMO)

## 2017-09-26 DIAGNOSIS — R945 Abnormal results of liver function studies: Principal | ICD-10-CM

## 2017-09-26 DIAGNOSIS — E785 Hyperlipidemia, unspecified: Secondary | ICD-10-CM

## 2017-09-26 DIAGNOSIS — R7989 Other specified abnormal findings of blood chemistry: Secondary | ICD-10-CM

## 2017-09-28 LAB — COMPLETE METABOLIC PANEL WITH GFR
AG RATIO: 1.5 (calc) (ref 1.0–2.5)
ALBUMIN MSPROF: 4.3 g/dL (ref 3.6–5.1)
ALT: 49 U/L — ABNORMAL HIGH (ref 6–29)
AST: 54 U/L — ABNORMAL HIGH (ref 10–35)
Alkaline phosphatase (APISO): 71 U/L (ref 33–130)
BUN: 8 mg/dL (ref 7–25)
CO2: 26 mmol/L (ref 20–32)
Calcium: 9.4 mg/dL (ref 8.6–10.4)
Chloride: 103 mmol/L (ref 98–110)
Creat: 0.8 mg/dL (ref 0.50–1.05)
GFR, EST AFRICAN AMERICAN: 99 mL/min/{1.73_m2} (ref >=60)
GFR, Est Non African American: 85 mL/min/{1.73_m2} (ref >=60)
Globulin: 2.8 g/dL (calc) (ref 1.9–3.7)
Glucose, Bld: 122 mg/dL — ABNORMAL HIGH (ref 65–99)
POTASSIUM: 4.2 mmol/L (ref 3.5–5.3)
SODIUM: 141 mmol/L (ref 135–146)
TOTAL PROTEIN: 7.1 g/dL (ref 6.1–8.1)
Total Bilirubin: 0.3 mg/dL (ref 0.2–1.2)

## 2017-09-28 LAB — LIPID PANEL
Cholesterol: 197 mg/dL (ref <200)
HDL: 50 mg/dL — ABNORMAL LOW (ref >50)
LDL CHOLESTEROL (CALC): 126 mg/dL — AB
Non-HDL Cholesterol (Calc): 147 mg/dL (calc) — ABNORMAL HIGH (ref <130)
Total CHOL/HDL Ratio: 3.9 (calc) (ref <5.0)
Triglycerides: 100 mg/dL (ref <150)

## 2017-09-28 LAB — TEST AUTHORIZATION

## 2017-09-28 LAB — EXTRA LAV TOP TUBE

## 2017-09-28 LAB — HEMOGLOBIN A1C W/OUT EAG: Hgb A1c MFr Bld: 6.3 % of total Hgb — ABNORMAL HIGH (ref <5.7)

## 2017-09-29 LAB — HM MAMMOGRAPHY

## 2017-10-02 ENCOUNTER — Encounter: Payer: Self-pay | Admitting: Family Medicine

## 2017-10-25 ENCOUNTER — Encounter: Payer: Self-pay | Admitting: Family Medicine

## 2017-10-27 ENCOUNTER — Encounter: Payer: Self-pay | Admitting: *Deleted

## 2017-10-30 ENCOUNTER — Ambulatory Visit: Payer: Managed Care, Other (non HMO) | Admitting: Family Medicine

## 2017-10-30 ENCOUNTER — Encounter: Payer: Self-pay | Admitting: Family Medicine

## 2017-10-30 VITALS — BP 128/72 | HR 86 | Temp 98.0°F | Resp 18 | Ht 62.0 in | Wt 176.0 lb

## 2017-10-30 DIAGNOSIS — J069 Acute upper respiratory infection, unspecified: Secondary | ICD-10-CM | POA: Diagnosis not present

## 2017-10-30 MED ORDER — HYDROCODONE-HOMATROPINE 5-1.5 MG/5ML PO SYRP: 5.0000 mL | ORAL_SOLUTION | Freq: Three times a day (TID) | ORAL | 0 refills | Status: DC | PRN

## 2017-10-30 NOTE — Progress Notes (Signed)
   Subjective:    Patient ID: Stephanie [name], female    DOB: [birth year], 52 y.o.   MRN: [id number]  HPI Patient developed a fever, cough which is nonproductive, and sore throat yesterday. Fever broke last night. She still feels lethargic, tired, continues to cough, continues to have a sore throat, also reports head congestion. Denies any otalgia. Denies any sinus pain. Denies any nausea vomiting or diarrhea. Denies any severe body aches. Denies any shortness of breath or chest pain Past Medical History:  Diagnosis Date  . High cholesterol   . Hyperlipidemia   . Hypertension    Past Surgical History:  Procedure Laterality Date  . CESAREAN SECTION    . CHOLECYSTECTOMY    . NASAL SINUS SURGERY  2017   Dr.Byers [facility]   Current Outpatient Medications on File Prior to Visit  Medication Sig Dispense Refill  . simvastatin (ZOCOR) 40 MG tablet TAKE 1 TABLET BY MOUTH ONCE EVERY EVENING 90 tablet 3   No current facility-administered medications on file prior to visit.    No Known Allergies Social History   Socioeconomic History  . Marital status: Married    Spouse name: Not on file  . Number of children: Not on file  . Years of education: Not on file  . Highest education level: Not on file  Social Needs  . Financial resource strain: Not on file  . Food insecurity - worry: Not on file  . Food insecurity - inability: Not on file  . Transportation needs - medical: Not on file  . Transportation needs - non-medical: Not on file  Occupational History  . Not on file  Tobacco Use  . Smoking status: Never Smoker  . Smokeless tobacco: Never Used  Substance and Sexual Activity  . Alcohol use: No  . Drug use: Not on file  . Sexual activity: Yes    Comment: divorced  Other Topics Concern  . Not on file  Social History Narrative   ** Merged History Encounter **          Review of Systems  All other systems reviewed and are negative.      Objective:   Physical Exam    Constitutional: She appears well-developed and well-nourished.  HENT:  Right Ear: Tympanic membrane, external ear and ear canal normal.  Left Ear: Tympanic membrane, external ear and ear canal normal.  Nose: Mucosal edema and rhinorrhea present. Right sinus exhibits no maxillary sinus tenderness and no frontal sinus tenderness. Left sinus exhibits no maxillary sinus tenderness and no frontal sinus tenderness.  Mouth/Throat: Oropharynx is clear and moist. No oropharyngeal exudate.  Neck: Neck supple.  Cardiovascular: Normal rate, regular rhythm and normal heart sounds.  Pulmonary/Chest: Effort normal and breath sounds normal. No respiratory distress. She has no wheezes. She has no rales.  Lymphadenopathy:    She has no cervical adenopathy.  Vitals reviewed.         Assessment & Plan:  Viral upper respiratory tract infection  Patient has a viral upper respiratory infection. Recommended tincture of time. She can use Hycodan 1 teaspoon every 6 hours as needed for cough. Use Sudafed for nasal congestion. Use Delsym for cough during the day. Use Chloraseptic or Cepacol lozenges as needed for sore throat. Use ibuprofen as needed for body aches. Anticipate self limited resolution in 5-7 days

## 2017-11-27 ENCOUNTER — Encounter: Payer: Self-pay | Admitting: Family Medicine

## 2017-12-21 ENCOUNTER — Encounter: Payer: Self-pay | Admitting: Family Medicine

## 2017-12-21 ENCOUNTER — Ambulatory Visit (INDEPENDENT_AMBULATORY_CARE_PROVIDER_SITE_OTHER): Payer: Managed Care, Other (non HMO) | Admitting: Family Medicine

## 2017-12-21 VITALS — BP 150/100 | HR 80 | Temp 98.3°F | Resp 16 | Ht 62.0 in | Wt 174.0 lb

## 2017-12-21 DIAGNOSIS — E785 Hyperlipidemia, unspecified: Secondary | ICD-10-CM | POA: Diagnosis not present

## 2017-12-21 NOTE — Progress Notes (Signed)
Subjective:    Patient ID: Stephanie [name], female    DOB: Jan 05, 1966, 52 y.o.   MRN: [id number]  HPI  10/30/17 Patient developed a fever, cough which is nonproductive, and sore throat yesterday. Fever broke last night. She still feels lethargic, tired, continues to cough, continues to have a sore throat, also reports head congestion. Denies any otalgia. Denies any sinus pain. Denies any nausea vomiting or diarrhea. Denies any severe body aches. Denies any shortness of breath or chest pain.  At that time, my plan was: Patient has a viral upper respiratory infection. Recommended tincture of time. She can use Hycodan 1 teaspoon every 6 hours as needed for cough. Use Sudafed for nasal congestion. Use Delsym for cough during the day. Use Chloraseptic or Cepacol lozenges as needed for sore throat. Use ibuprofen as needed for body aches. Anticipate self limited resolution in 5-7 days  12/21/17 Patient is here today to recheck her cholesterol.  She is currently taking Zocor 40 mg p.o. daily.  There is no evidence of myalgias or right upper quadrant pain.  Unfortunately she is under a tremendous amount of stress.  Her mother's dementia seems to be worsening.  She is aso demonstrating parkinsonian features.  There is the possibility that she is experiencing seizures.  All the stresses waiting on the patient and I believe causing her elevated blood pressure.  There is also some family stress.  The patient's brother is attempting to divert her mother's finances.  All these factors together I believe are raising her blood pressure.  It has not been this high at several of her last office visits. Past Medical History:  Diagnosis Date  . High cholesterol   . Hyperlipidemia   . Hypertension    Past Surgical History:  Procedure Laterality Date  . CESAREAN SECTION    . CHOLECYSTECTOMY    . NASAL SINUS SURGERY  2017   Dr.Byers [facility]   Current Outpatient Medications on File Prior to Visit  Medication Sig  Dispense Refill  . HYDROcodone-homatropine (HYCODAN) 5-1.5 MG/5ML syrup Take 5 mLs by mouth every 8 (eight) hours as needed for cough. 120 mL 0  . simvastatin (ZOCOR) 40 MG tablet TAKE 1 TABLET BY MOUTH ONCE EVERY EVENING 90 tablet 3   No current facility-administered medications on file prior to visit.    No Known Allergies Social History   Socioeconomic History  . Marital status: Married    Spouse name: Not on file  . Number of children: Not on file  . Years of education: Not on file  . Highest education level: Not on file  Occupational History  . Not on file  Social Needs  . Financial resource strain: Not on file  . Food insecurity:    Worry: Not on file    Inability: Not on file  . Transportation needs:    Medical: Not on file    Non-medical: Not on file  Tobacco Use  . Smoking status: Never Smoker  . Smokeless tobacco: Never Used  Substance and Sexual Activity  . Alcohol use: No  . Drug use: Not on file  . Sexual activity: Yes    Comment: divorced  Lifestyle  . Physical activity:    Days per week: Not on file    Minutes per session: Not on file  . Stress: Not on file  Relationships  . Social connections:    Talks on phone: Not on file    Gets together: Not on file  Attends religious service: Not on file    Active member of club or organization: Not on file    Attends meetings of clubs or organizations: Not on file    Relationship status: Not on file  . Intimate partner violence:    Fear of current or ex partner: Not on file    Emotionally abused: Not on file    Physically abused: Not on file    Forced sexual activity: Not on file  Other Topics Concern  . Not on file  Social History Narrative   ** Merged History Encounter **          Review of Systems  All other systems reviewed and are negative.      Objective:   Physical Exam  Constitutional: She appears well-developed and well-nourished.  HENT:  Right Ear: Tympanic membrane, external ear  and ear canal normal.  Left Ear: Tympanic membrane, external ear and ear canal normal.  Nose: Right sinus exhibits no maxillary sinus tenderness and no frontal sinus tenderness. Left sinus exhibits no maxillary sinus tenderness and no frontal sinus tenderness.  Mouth/Throat: Oropharynx is clear and moist. No oropharyngeal exudate.  Neck: Neck supple.  Cardiovascular: Normal rate, regular rhythm and normal heart sounds.  Pulmonary/Chest: Effort normal and breath sounds normal. No respiratory distress. She has no wheezes. She has no rales.  Lymphadenopathy:    She has no cervical adenopathy.  Vitals reviewed.         Assessment & Plan:  Hyperlipidemia, unspecified hyperlipidemia type - Plan: COMPLETE METABOLIC PANEL WITH GFR, Lipid panel  Check a fasting lipid panel.  Goal LDL cholesterol is less than 130.  Patient's blood pressure is extremely high today.  I believe is due to stress.  Patient will recheck her blood pressure and notify us of the values in 1 to 2 weeks.  If persistently elevated I would treat it.  I offered the patient Xanax on an as-needed basis for anxiety but she declined it at the present time.  Gynecologic care is provided at her GYN.  Mammogram and Pap smear up-to-date.  Due to all the stress the patient is under, I will defer a colonoscopy at the present time.  It seems like she is handling all she can bear currently.

## 2017-12-25 ENCOUNTER — Encounter: Payer: Self-pay | Admitting: Family Medicine

## 2017-12-25 DIAGNOSIS — R7303 Prediabetes: Secondary | ICD-10-CM | POA: Insufficient documentation

## 2017-12-26 ENCOUNTER — Encounter: Payer: Self-pay | Admitting: Family Medicine

## 2017-12-26 ENCOUNTER — Ambulatory Visit: Payer: Managed Care, Other (non HMO) | Admitting: Family Medicine

## 2017-12-26 VITALS — BP 190/110 | HR 90 | Temp 98.0°F | Resp 14 | Ht 62.0 in | Wt 174.0 lb

## 2017-12-26 DIAGNOSIS — I10 Essential (primary) hypertension: Secondary | ICD-10-CM | POA: Diagnosis not present

## 2017-12-26 DIAGNOSIS — F418 Other specified anxiety disorders: Secondary | ICD-10-CM | POA: Diagnosis not present

## 2017-12-26 LAB — COMPLETE METABOLIC PANEL WITH GFR
AG Ratio: 1.4 (calc) (ref 1.0–2.5)
ALKALINE PHOSPHATASE (APISO): 80 U/L (ref 33–130)
ALT: 69 U/L — AB (ref 6–29)
AST: 91 U/L — ABNORMAL HIGH (ref 10–35)
Albumin: 4.4 g/dL (ref 3.6–5.1)
BILIRUBIN TOTAL: 0.4 mg/dL (ref 0.2–1.2)
BUN: 8 mg/dL (ref 7–25)
CHLORIDE: 105 mmol/L (ref 98–110)
CO2: 26 mmol/L (ref 20–32)
Calcium: 9.2 mg/dL (ref 8.6–10.4)
Creat: 0.69 mg/dL (ref 0.50–1.05)
GFR, Est African American: 117 mL/min/{1.73_m2} (ref >=60)
GFR, Est Non African American: 101 mL/min/{1.73_m2} (ref >=60)
GLUCOSE: 124 mg/dL — AB (ref 65–99)
Globulin: 3.1 g/dL (calc) (ref 1.9–3.7)
POTASSIUM: 4.1 mmol/L (ref 3.5–5.3)
Sodium: 139 mmol/L (ref 135–146)
Total Protein: 7.5 g/dL (ref 6.1–8.1)

## 2017-12-26 LAB — EXTRA LAV TOP TUBE

## 2017-12-26 LAB — HEPATITIS PANEL, ACUTE
HEP A IGM: NONREACTIVE
HEP B C IGM: NONREACTIVE
HEP B S AG: NONREACTIVE
Hepatitis C Ab: NONREACTIVE
SIGNAL TO CUT-OFF: 0.03 (ref <1.00)

## 2017-12-26 LAB — LIPID PANEL
CHOLESTEROL: 227 mg/dL — AB (ref <200)
HDL: 43 mg/dL — ABNORMAL LOW (ref >50)
LDL CHOLESTEROL (CALC): 155 mg/dL — AB
Non-HDL Cholesterol (Calc): 184 mg/dL (calc) — ABNORMAL HIGH (ref <130)
TRIGLYCERIDES: 154 mg/dL — AB (ref <150)
Total CHOL/HDL Ratio: 5.3 (calc) — ABNORMAL HIGH (ref <5.0)

## 2017-12-26 LAB — TEST AUTHORIZATION

## 2017-12-26 MED ORDER — LISINOPRIL-HYDROCHLOROTHIAZIDE 20-12.5 MG PO TABS: 1.0000 | ORAL_TABLET | Freq: Every day | ORAL | 3 refills | Status: DC

## 2017-12-26 MED ORDER — ALPRAZOLAM 0.5 MG PO TABS: 0.5000 mg | ORAL_TABLET | Freq: Three times a day (TID) | ORAL | 0 refills | Status: DC | PRN

## 2017-12-26 NOTE — Progress Notes (Signed)
Subjective:    Patient ID: Stephanie [name], female    DOB: September 09, 1965, 52 y.o.   MRN: [id number]  HPI  Patient's blood pressure is spiraling out of control.  She has been monitoring at home and finding it between 254 and 270 systolic over 623 diastolic.  However when she gets anxious, her blood pressure gets even worse and can be as high as 762 systolic.  There is a tremendous amount of stress in her life right now.  She is started a new job.  She is fighting with her brother over her mother's finances.  Her mother is in a nursing home with Parkinson's disease.  There is stress in her marriage.  Although this weighing together is causing her tremendous anxiety which triggers panic attacks which elevate her blood pressure even higher.  However even on her good days, her blood pressure is extremely high.  She denies any chest pain shortness of breath or dyspnea on exertion.  She does have a dull headache when her blood pressure is high but she denies any neurologic symptoms or blurry vision or neurologic deficits.  She denies any oliguria or hematuria. Office Visit on 12/21/2017  Component Date Value Ref Range Status  . Glucose, Bld 12/21/2017 124* 65 - 99 mg/dL Final   Comment: .            Fasting reference interval . For someone without known diabetes, a glucose value between 100 and 125 mg/dL is consistent with prediabetes and should be confirmed with a follow-up test. .   . BUN 12/21/2017 8  7 - 25 mg/dL Final  . Creat 12/21/2017 0.69  0.50 - 1.05 mg/dL Final   Comment: For patients >42 years of age, the reference limit for Creatinine is approximately 13% higher for people identified as African-American. .   . GFR, Est Non African American 12/21/2017 101  > OR = 60 mL/min/1.29m2 Final  . GFR, Est African American 12/21/2017 117  > OR = 60 mL/min/1.32m2 Final  . BUN/Creatinine Ratio 83/15/1761 NOT APPLICABLE  6 - 22 (calc) Final  . Sodium 12/21/2017 139  135 - 146 mmol/L Final  .  Potassium 12/21/2017 4.1  3.5 - 5.3 mmol/L Final  . Chloride 12/21/2017 105  98 - 110 mmol/L Final  . CO2 12/21/2017 26  20 - 32 mmol/L Final  . Calcium 12/21/2017 9.2  8.6 - 10.4 mg/dL Final  . Total Protein 12/21/2017 7.5  6.1 - 8.1 g/dL Final  . Albumin 12/21/2017 4.4  3.6 - 5.1 g/dL Final  . Globulin 12/21/2017 3.1  1.9 - 3.7 g/dL (calc) Final  . AG Ratio 12/21/2017 1.4  1.0 - 2.5 (calc) Final  . Total Bilirubin 12/21/2017 0.4  0.2 - 1.2 mg/dL Final  . Alkaline phosphatase (APISO) 12/21/2017 80  33 - 130 U/L Final  . AST 12/21/2017 91* 10 - 35 U/L Final  . ALT 12/21/2017 69* 6 - 29 U/L Final  . Cholesterol 12/21/2017 227* <200 mg/dL Final  . HDL 12/21/2017 43* >50 mg/dL Final  . Triglycerides 12/21/2017 154* <150 mg/dL Final  . LDL Cholesterol (Calc) 12/21/2017 155* mg/dL (calc) Final   Comment: Reference range: <100 . Desirable range <100 mg/dL for primary prevention;   <70 mg/dL for patients with CHD or diabetic patients  with > or = 2 CHD risk factors. Marland Kitchen LDL-C is now calculated using the Martin-Hopkins  calculation, which is a validated novel method providing  better accuracy than the Friedewald equation in the  estimation of LDL-C.  Cresenciano Genre et al. Annamaria Helling. 5053;976(73): 2061-2068  (http://education.[facility].com/faq/FAQ164)   . Total CHOL/HDL Ratio 12/21/2017 5.3* <5.0 (calc) Final  . Non-HDL Cholesterol (Calc) 12/21/2017 184* <130 mg/dL (calc) Final   Comment: For patients with diabetes plus 1 major ASCVD risk  factor, treating to a non-HDL-C goal of <100 mg/dL  (LDL-C of <70 mg/dL) is considered a therapeutic  option.   Marland Kitchen EXTRA LAVENDER-TOP TUBE 12/21/2017    Final   Comment: We received an extra specimen with no test requested. If any test is desired for this specimen please call client services and advise.   . TEST NAME: 12/21/2017 HEPATITIS PANEL, ACUTE W/REFL   Final  . TEST CODE: 12/21/2017 [id number]   Final  . CLIENT CONTACT: 12/21/2017 Learta Codding    Final  . REPORT ALWAYS MESSAGE SIGNATURE 12/21/2017    Final   Comment: . The laboratory testing on this patient was verbally requested or confirmed by the ordering physician or his or her authorized representative after contact with an employee of [employer]. Federal regulations require that we maintain on file written authorization for all laboratory testing.  Accordingly we are asking that the ordering physician or his or her authorized representative sign a copy of this report and promptly return it to the client service representative. . . Signature:____________________________________________________ . Please fax this signed page to [phone] or return it via your [employer] courier.    Past Medical History:  Diagnosis Date  . High cholesterol   . Hyperlipidemia   . Hypertension   . Prediabetes    Past Surgical History:  Procedure Laterality Date  . CESAREAN SECTION    . CHOLECYSTECTOMY    . NASAL SINUS SURGERY  2017   Dr.Byers [facility]   Current Outpatient Medications on File Prior to Visit  Medication Sig Dispense Refill  . simvastatin (ZOCOR) 40 MG tablet TAKE 1 TABLET BY MOUTH ONCE EVERY EVENING 90 tablet 3   No current facility-administered medications on file prior to visit.    No Known Allergies Social History   Socioeconomic History  . Marital status: Married    Spouse name: Not on file  . Number of children: Not on file  . Years of education: Not on file  . Highest education level: Not on file  Occupational History  . Not on file  Social Needs  . Financial resource strain: Not on file  . Food insecurity:    Worry: Not on file    Inability: Not on file  . Transportation needs:    Medical: Not on file    Non-medical: Not on file  Tobacco Use  . Smoking status: Never Smoker  . Smokeless tobacco: Never Used  Substance and Sexual Activity  . Alcohol use: No  . Drug use: Not on file  . Sexual activity: Yes    Comment: divorced    Lifestyle  . Physical activity:    Days per week: Not on file    Minutes per session: Not on file  . Stress: Not on file  Relationships  . Social connections:    Talks on phone: Not on file    Gets together: Not on file    Attends religious service: Not on file    Active member of club or organization: Not on file    Attends meetings of clubs or organizations: Not on file    Relationship status: Not on file  . Intimate partner violence:    Fear of current or  ex partner: Not on file    Emotionally abused: Not on file    Physically abused: Not on file    Forced sexual activity: Not on file  Other Topics Concern  . Not on file  Social History Narrative   ** Merged History Encounter **         Review of Systems  All other systems reviewed and are negative.      Objective:   Physical Exam  Constitutional: She is oriented to person, place, and time. She appears well-developed and well-nourished.  Eyes: Pupils are equal, round, and reactive to light. Conjunctivae and EOM are normal.  Cardiovascular: Normal rate, regular rhythm and normal heart sounds. Exam reveals no gallop and no friction rub.  No murmur heard. Pulmonary/Chest: Effort normal and breath sounds normal.  Neurological: She is alert and oriented to person, place, and time. She displays normal reflexes. No cranial nerve deficit or sensory deficit. She exhibits normal muscle tone. Coordination normal.  Skin: She is not diaphoretic.  Vitals reviewed.         Assessment & Plan:  Essential hypertension  Situational anxiety  Begin lisinopril hydrochlorothiazide 20/12.5, 1 tablet p.o. daily and recheck blood pressure in 1 week.  Avoid sodium and salt.  Also focus on controlling anxiety.  Avoid stressful situations.  Reduce stress as much as possible in her life.  Also use Xanax 0.5 mg every 8 hours as needed for anxiety or panic.  Reassess in 1 week.  At that time we will discuss her lab work from her recent  physical further

## 2018-01-05 ENCOUNTER — Encounter: Payer: Self-pay | Admitting: Family Medicine

## 2018-01-05 ENCOUNTER — Ambulatory Visit (INDEPENDENT_AMBULATORY_CARE_PROVIDER_SITE_OTHER): Payer: Managed Care, Other (non HMO) | Admitting: Family Medicine

## 2018-01-05 VITALS — BP 130/76 | HR 78 | Temp 98.0°F | Resp 14 | Ht 62.0 in | Wt 171.0 lb

## 2018-01-05 DIAGNOSIS — I10 Essential (primary) hypertension: Secondary | ICD-10-CM | POA: Diagnosis not present

## 2018-01-05 DIAGNOSIS — R7303 Prediabetes: Secondary | ICD-10-CM | POA: Diagnosis not present

## 2018-01-05 DIAGNOSIS — E8881 Metabolic syndrome: Secondary | ICD-10-CM | POA: Diagnosis not present

## 2018-01-05 DIAGNOSIS — K760 Fatty (change of) liver, not elsewhere classified: Secondary | ICD-10-CM | POA: Diagnosis not present

## 2018-01-05 DIAGNOSIS — E785 Hyperlipidemia, unspecified: Secondary | ICD-10-CM

## 2018-01-05 MED ORDER — ROSUVASTATIN CALCIUM 20 MG PO TABS: 20.0000 mg | ORAL_TABLET | Freq: Every day | ORAL | 3 refills | Status: DC

## 2018-01-05 NOTE — Progress Notes (Signed)
Subjective:    Patient ID: Stephanie [name], female    DOB: 01-04-1966, 52 y.o.   MRN: [id number]  HPI 12/26/17 Patient's blood pressure is spiraling out of control.  She has been monitoring at home and finding it between 237 and 628 systolic over 315 diastolic.  However when she gets anxious, her blood pressure gets even worse and can be as high as 176 systolic.  There is a tremendous amount of stress in her life right now.  She is started a new job.  She is fighting with her brother over her mother's finances.  Her mother is in a nursing home with Parkinson's disease.  There is stress in her marriage.  Although this weighing together is causing her tremendous anxiety which triggers panic attacks which elevate her blood pressure even higher.  However even on her good days, her blood pressure is extremely high.  She denies any chest pain shortness of breath or dyspnea on exertion.  She does have a dull headache when her blood pressure is high but she denies any neurologic symptoms or blurry vision or neurologic deficits.  She denies any oliguria or hematuria.  At that time, my plan was: Begin lisinopril hydrochlorothiazide 20/12.5, 1 tablet p.o. daily and recheck blood pressure in 1 week.  Avoid sodium and salt.  Also focus on controlling anxiety.  Avoid stressful situations.  Reduce stress as much as possible in her life.  Also use Xanax 0.5 mg every 8 hours as needed for anxiety or panic.  Reassess in 1 week.  At that time we will discuss her lab work from her recent physical further  01/05/18 Patient is here today for follow-up.  Blood pressure is much better 130/76.  She denies any chest pain shortness of breath or dyspnea on exertion.  She is here today to discuss her lab work which is listed below. Office Visit on 12/21/2017  Component Date Value Ref Range Status  . Glucose, Bld 12/21/2017 124* 65 - 99 mg/dL Final   Comment: .            Fasting reference interval . For someone without known  diabetes, a glucose value between 100 and 125 mg/dL is consistent with prediabetes and should be confirmed with a follow-up test. .   . BUN 12/21/2017 8  7 - 25 mg/dL Final  . Creat 12/21/2017 0.69  0.50 - 1.05 mg/dL Final   Comment: For patients >45 years of age, the reference limit for Creatinine is approximately 13% higher for people identified as African-American. .   . GFR, Est Non African American 12/21/2017 101  > OR = 60 mL/min/1.63m2 Final  . GFR, Est African American 12/21/2017 117  > OR = 60 mL/min/1.32m2 Final  . BUN/Creatinine Ratio 16/02/3709 NOT APPLICABLE  6 - 22 (calc) Final  . Sodium 12/21/2017 139  135 - 146 mmol/L Final  . Potassium 12/21/2017 4.1  3.5 - 5.3 mmol/L Final  . Chloride 12/21/2017 105  98 - 110 mmol/L Final  . CO2 12/21/2017 26  20 - 32 mmol/L Final  . Calcium 12/21/2017 9.2  8.6 - 10.4 mg/dL Final  . Total Protein 12/21/2017 7.5  6.1 - 8.1 g/dL Final  . Albumin 12/21/2017 4.4  3.6 - 5.1 g/dL Final  . Globulin 12/21/2017 3.1  1.9 - 3.7 g/dL (calc) Final  . AG Ratio 12/21/2017 1.4  1.0 - 2.5 (calc) Final  . Total Bilirubin 12/21/2017 0.4  0.2 - 1.2 mg/dL Final  . Alkaline  phosphatase (APISO) 12/21/2017 80  33 - 130 U/L Final  . AST 12/21/2017 91* 10 - 35 U/L Final  . ALT 12/21/2017 69* 6 - 29 U/L Final  . Cholesterol 12/21/2017 227* <200 mg/dL Final  . HDL 12/21/2017 43* >50 mg/dL Final  . Triglycerides 12/21/2017 154* <150 mg/dL Final  . LDL Cholesterol (Calc) 12/21/2017 155* mg/dL (calc) Final   Comment: Reference range: <100 . Desirable range <100 mg/dL for primary prevention;   <70 mg/dL for patients with CHD or diabetic patients  with > or = 2 CHD risk factors. Marland Kitchen LDL-C is now calculated using the Martin-Hopkins  calculation, which is a validated novel method providing  better accuracy than the Friedewald equation in the  estimation of LDL-C.  Cresenciano Genre et al. Annamaria Helling. 2778;242(35): 2061-2068    (http://education.[facility].com/faq/FAQ164)   . Total CHOL/HDL Ratio 12/21/2017 5.3* <5.0 (calc) Final  . Non-HDL Cholesterol (Calc) 12/21/2017 184* <130 mg/dL (calc) Final   Comment: For patients with diabetes plus 1 major ASCVD risk  factor, treating to a non-HDL-C goal of <100 mg/dL  (LDL-C of <70 mg/dL) is considered a therapeutic  option.   Marland Kitchen EXTRA LAVENDER-TOP TUBE 12/21/2017    Final   Comment: We received an extra specimen with no test requested. If any test is desired for this specimen please call client services and advise.   Marland Kitchen Hep A IgM 12/21/2017 NON-REACTIVE  NON-REACTI Final  . Hepatitis B Surface Ag 12/21/2017 NON-REACTIVE  NON-REACTI Final  . Hep B C IgM 12/21/2017 NON-REACTIVE  NON-REACTI Final  . Hepatitis C Ab 12/21/2017 NON-REACTIVE  NON-REACTI Final  . SIGNAL TO CUT-OFF 12/21/2017 0.03  <1.00 Final   Comment: . HCV antibody was non-reactive. There is no laboratory  evidence of HCV infection. . In most cases, no further action is required. However, if recent HCV exposure is suspected, a test for HCV RNA (test code [phone]) is suggested. . For additional information please refer to http://education.[facility].com/faq/FAQ22v1 (This link is being provided for informational/ educational purposes only.) .   Marland Kitchen TEST NAME: 12/21/2017 HEPATITIS PANEL, ACUTE W/REFL   Final  . TEST CODE: 12/21/2017 [id number]   Final  . CLIENT CONTACT: 12/21/2017 Learta Codding   Final  . REPORT ALWAYS MESSAGE SIGNATURE 12/21/2017    Final   Comment: . The laboratory testing on this patient was verbally requested or confirmed by the ordering physician or his or her authorized representative after contact with an employee of [employer]. Federal regulations require that we maintain on file written authorization for all laboratory testing.  Accordingly we are asking that the ordering physician or his or her authorized representative sign a copy of this report and  promptly return it to the client service representative. . . Signature:____________________________________________________ . Please fax this signed page to [phone] or return it via your [employer] courier.    Past Medical History:  Diagnosis Date  . High cholesterol   . Hyperlipidemia   . Hypertension   . Prediabetes    Past Surgical History:  Procedure Laterality Date  . CESAREAN SECTION    . CHOLECYSTECTOMY    . NASAL SINUS SURGERY  2017   Dr.Byers [facility]   Current Outpatient Medications on File Prior to Visit  Medication Sig Dispense Refill  . ALPRAZolam (XANAX) 0.5 MG tablet Take 1 tablet (0.5 mg total) by mouth 3 (three) times daily as needed for anxiety. 30 tablet 0  . lisinopril-hydrochlorothiazide (ZESTORETIC) 20-12.5 MG tablet Take 1 tablet by mouth daily. 90 tablet  3  . simvastatin (ZOCOR) 40 MG tablet TAKE 1 TABLET BY MOUTH ONCE EVERY EVENING 90 tablet 3   No current facility-administered medications on file prior to visit.    No Known Allergies Social History   Socioeconomic History  . Marital status: Married    Spouse name: Not on file  . Number of children: Not on file  . Years of education: Not on file  . Highest education level: Not on file  Occupational History  . Not on file  Social Needs  . Financial resource strain: Not on file  . Food insecurity:    Worry: Not on file    Inability: Not on file  . Transportation needs:    Medical: Not on file    Non-medical: Not on file  Tobacco Use  . Smoking status: Never Smoker  . Smokeless tobacco: Never Used  Substance and Sexual Activity  . Alcohol use: No  . Drug use: Not on file  . Sexual activity: Yes    Comment: divorced  Lifestyle  . Physical activity:    Days per week: Not on file    Minutes per session: Not on file  . Stress: Not on file  Relationships  . Social connections:    Talks on phone: Not on file    Gets together: Not on file    Attends religious service: Not on  file    Active member of club or organization: Not on file    Attends meetings of clubs or organizations: Not on file    Relationship status: Not on file  . Intimate partner violence:    Fear of current or ex partner: Not on file    Emotionally abused: Not on file    Physically abused: Not on file    Forced sexual activity: Not on file  Other Topics Concern  . Not on file  Social History Narrative   ** Merged History Encounter **         Review of Systems  All other systems reviewed and are negative.      Objective:   Physical Exam  Constitutional: She is oriented to person, place, and time. She appears well-developed and well-nourished.  Eyes: Pupils are equal, round, and reactive to light. Conjunctivae and EOM are normal.  Cardiovascular: Normal rate, regular rhythm and normal heart sounds. Exam reveals no gallop and no friction rub.  No murmur heard. Pulmonary/Chest: Effort normal and breath sounds normal.  Neurological: She is alert and oriented to person, place, and time. She displays normal reflexes. No cranial nerve deficit or sensory deficit. She exhibits normal muscle tone. Coordination normal.  Skin: She is not diaphoretic.  Vitals reviewed.         Assessment & Plan:  Essential hypertension  Prediabetes  Hyperlipidemia, unspecified hyperlipidemia type  Fatty liver disease, nonalcoholic  Metabolic syndrome  I spent more than 25 minutes today with the patient in direct discussion regarding her lab work.  Her blood pressure is now well controlled and I will make no further changes.  However she has evidence of fatty liver disease, borderline type 2 diabetes mellitus, uncontrolled hyperlipidemia, as well as dyslipidemia with a low HDL and also hypertriglyceridemia.  We discussed dietary strategies including a Mediterranean diet versus a [employer], restricting calories to less than 1500 cal/day, engaging in 30 minutes of aerobic exercise 5 days a week,  reduction of carbohydrates.  We discussed what carbohydrates are and foods and drinks to avoid.  Patient drinks  3 to 4 glasses of sweet tea per day.  She also drinks several sodas.  I encouraged her to stop this immediately as this is an easy source of carbohydrates that she can reduce from her diet.  She also tends to eat a lot of bread mashed potatoes and pasta.  I recommended that she discontinue these as well or at least limit and reduce her intake.  I recommended discontinuation of simvastatin and replacing it with Crestor 20 mg a day.  I would like to see her back in 6 months after she is instituted these dietary and lifestyle changes to recheck her lab work.  We discussed the increased risk of cardiovascular disease in patients with metabolic syndrome.  Hopefully we can affect therapeutic lifestyle changes that will reduce her overall cardiovascular mortality risk.

## 2018-02-01 ENCOUNTER — Encounter: Payer: Self-pay | Admitting: Family Medicine

## 2018-02-06 ENCOUNTER — Encounter: Payer: Self-pay | Admitting: Family Medicine

## 2018-02-06 ENCOUNTER — Ambulatory Visit: Payer: Managed Care, Other (non HMO) | Admitting: Family Medicine

## 2018-02-06 VITALS — BP 144/88 | HR 78 | Temp 97.9°F | Resp 16 | Ht 62.0 in | Wt 171.0 lb

## 2018-02-06 DIAGNOSIS — F439 Reaction to severe stress, unspecified: Secondary | ICD-10-CM

## 2018-02-06 NOTE — Progress Notes (Signed)
Subjective:    Patient ID: Stephanie [name], female    DOB: 07/30/1966, 52 y.o.   MRN: [id number]  HPI  Patient had to leave work today.  She felt extremely dizzy and lightheaded and had to lay down for 20 minutes and I recommended she come see the doctor.  However upon further discussion, I believe the patient is overcome with stress and anxiety.  Her mother is in end-stage dementia with Parkinson's disease.  There is even possible discussion that she may have Lewy body dementia.  She is rapidly progressing/deteriorating.  She is falling numerous times and has been hospitalized on several occasions.  She has recently been transitioned from the hospital to [facility].  At the exact same time, the patient's brother has been creating financial habit with the patient's mother's estate.  He is even been accused of stealing money from his mother and he continues to create stress for the patient interfering with her care of her mother.  Furthermore this morning, the patient got into a heated argument with her husband.  Both were cussing each other.  She is contemplating a trial separation.  I believe all these factors came to ahead and trigger the patient to have a panic attack.  She agrees.  However she needs a note for work. Past Medical History:  Diagnosis Date  . High cholesterol   . Hyperlipidemia   . Hypertension   . Prediabetes    Past Surgical History:  Procedure Laterality Date  . CESAREAN SECTION    . CHOLECYSTECTOMY    . NASAL SINUS SURGERY  2017   Dr.[facility]   Current Outpatient Medications on File Prior to Visit  Medication Sig Dispense Refill  . ALPRAZolam (XANAX) 0.5 MG tablet Take 1 tablet (0.5 mg total) by mouth 3 (three) times daily as needed for anxiety. 30 tablet 0  . lisinopril-hydrochlorothiazide (ZESTORETIC) 20-12.5 MG tablet Take 1 tablet by mouth daily. 90 tablet 3  . rosuvastatin (CRESTOR) 20 MG tablet Take 1 tablet (20 mg total) by mouth daily. 90 tablet 3  .  simvastatin (ZOCOR) 40 MG tablet TAKE 1 TABLET BY MOUTH ONCE EVERY EVENING 90 tablet 3   No current facility-administered medications on file prior to visit.    No Known Allergies Social History   Socioeconomic History  . Marital status: Married    Spouse name: Not on file  . Number of children: Not on file  . Years of education: Not on file  . Highest education level: Not on file  Occupational History  . Not on file  Social Needs  . Financial resource strain: Not on file  . Food insecurity:    Worry: Not on file    Inability: Not on file  . Transportation needs:    Medical: Not on file    Non-medical: Not on file  Tobacco Use  . Smoking status: Never Smoker  . Smokeless tobacco: Never Used  Substance and Sexual Activity  . Alcohol use: No  . Drug use: Not on file  . Sexual activity: Yes    Comment: divorced  Lifestyle  . Physical activity:    Days per week: Not on file    Minutes per session: Not on file  . Stress: Not on file  Relationships  . Social connections:    Talks on phone: Not on file    Gets together: Not on file    Attends religious service: Not on file    Active member of club  or organization: Not on file    Attends meetings of clubs or organizations: Not on file    Relationship status: Not on file  . Intimate partner violence:    Fear of current or ex partner: Not on file    Emotionally abused: Not on file    Physically abused: Not on file    Forced sexual activity: Not on file  Other Topics Concern  . Not on file  Social History Narrative   ** Merged History Encounter **         Review of Systems  All other systems reviewed and are negative.      Objective:   Physical Exam  Constitutional: She appears well-developed and well-nourished. No distress.  Cardiovascular: Normal rate and regular rhythm.  Pulmonary/Chest: Effort normal and breath sounds normal.  Skin: She is not diaphoretic.  Psychiatric: She has a normal mood and affect.  Her speech is normal and behavior is normal. Judgment and thought content normal. Cognition and memory are normal.  Tearful during our discussion  Vitals reviewed.         Assessment & Plan:  Situational stress  I will gladly give the patient a work note for today.  I believe that all the social stress that she is under triggered a panic attack this morning.  I recommended that she go home and rest today.  I recommended that when both her and her husband have calm down that they have a calm discussion.  I discussed the situation with her mother.  I believe her mom's life expectancy is less than 6 months.  I believe it is understandable that the patient wants to spend more time with her given that situation and that her husband may not realize how serious the mother situation has gotten and therefore he may not be is understanding.  I believe that she needs to explain the situation more thoroughly to him and knowing him as I do I believe that he will try to be more understanding.  Ultimately I believe the patient is just going through a lot and needs better support at home

## 2018-03-12 ENCOUNTER — Encounter: Payer: Self-pay | Admitting: Family Medicine

## 2018-04-01 IMAGING — DX DG CERVICAL SPINE COMPLETE 4+V
6 series · 6 of 6 positions shown · non-contrast
Comparison: None.

CLINICAL DATA: Dizziness.  Neck stiffness.

EXAM:
CERVICAL SPINE - COMPLETE 4+ VIEW

[c-spine lat]
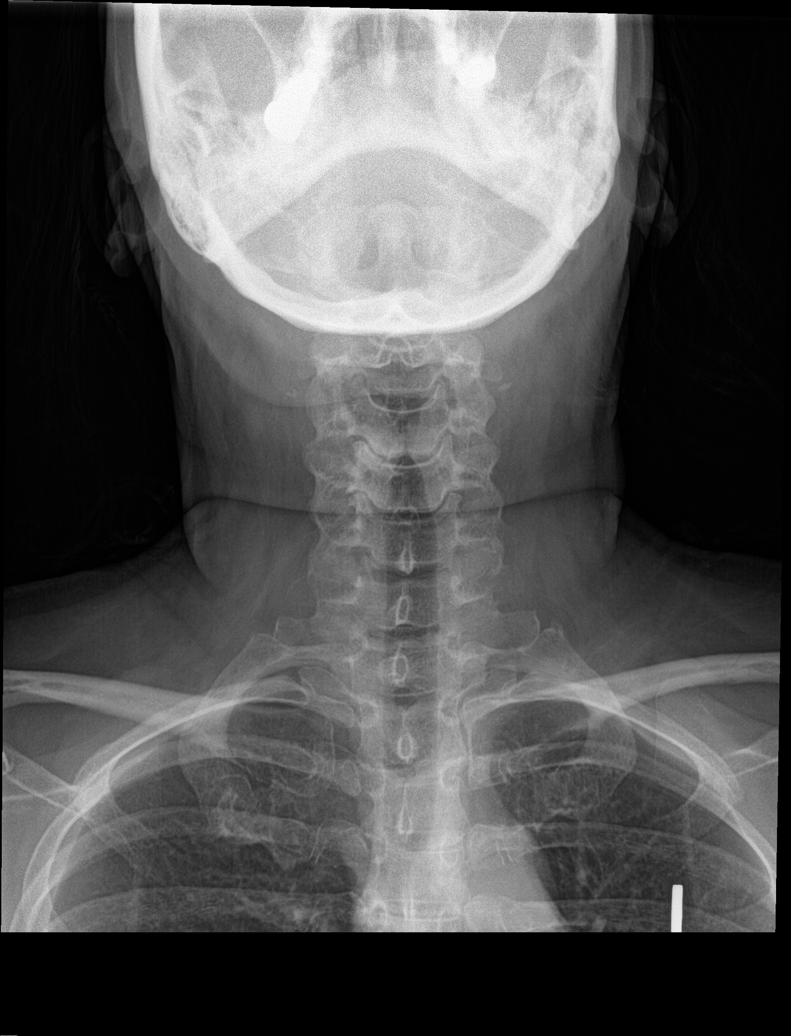

[c-spine obl (1 of 2)]
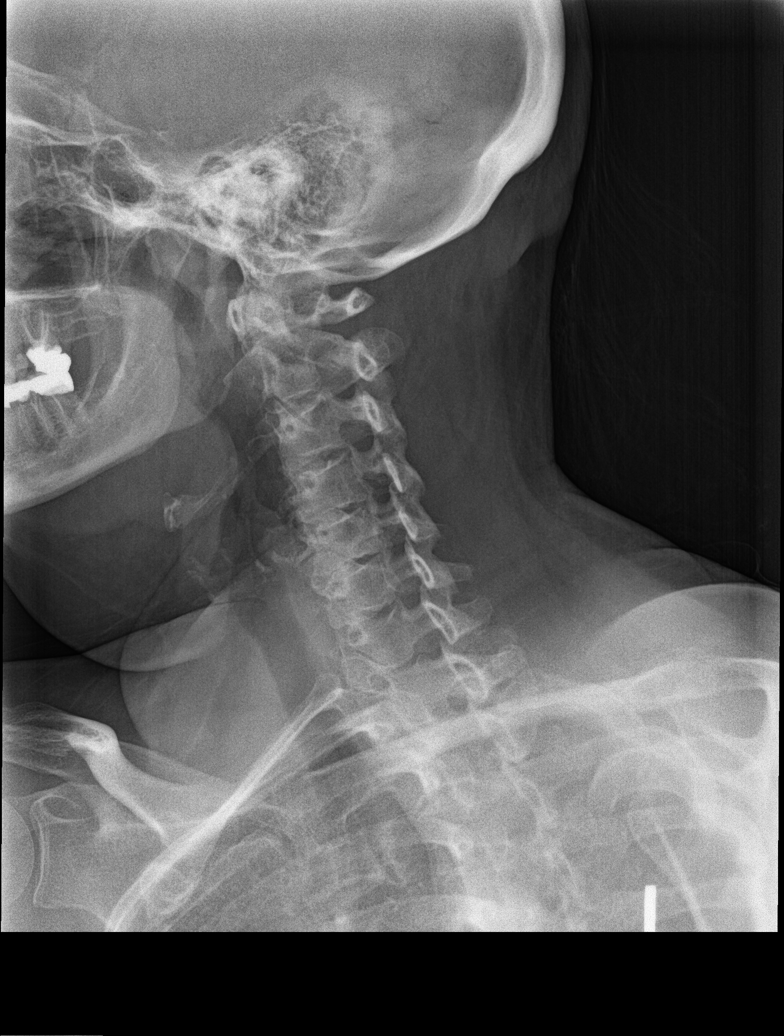

[c-spine obl (2 of 2)]
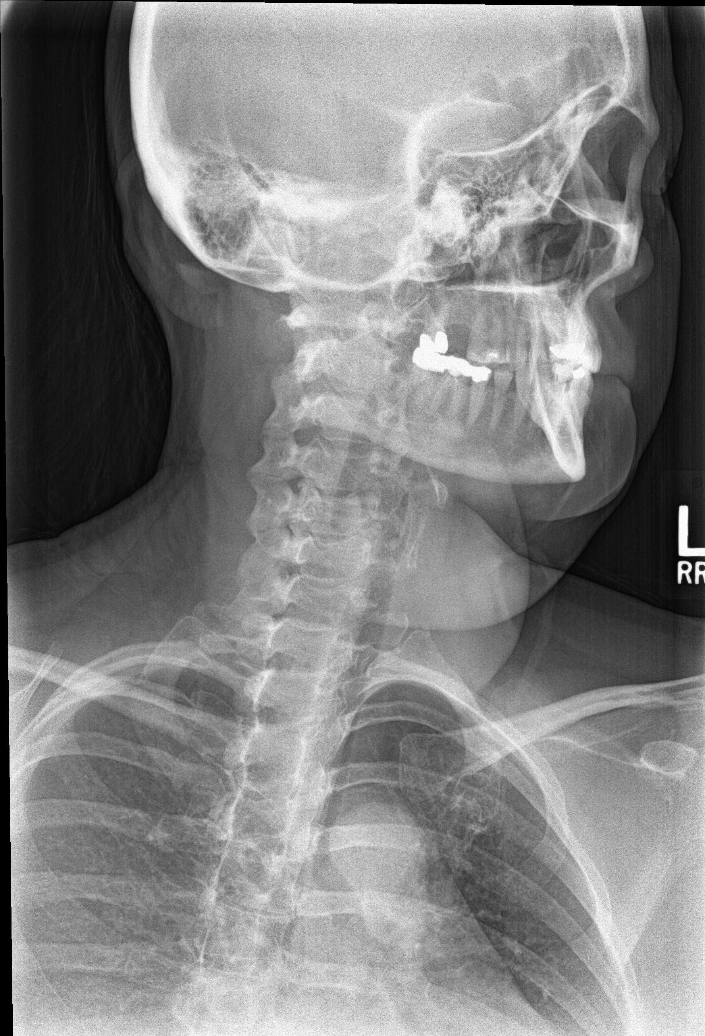

[c-spine ap]
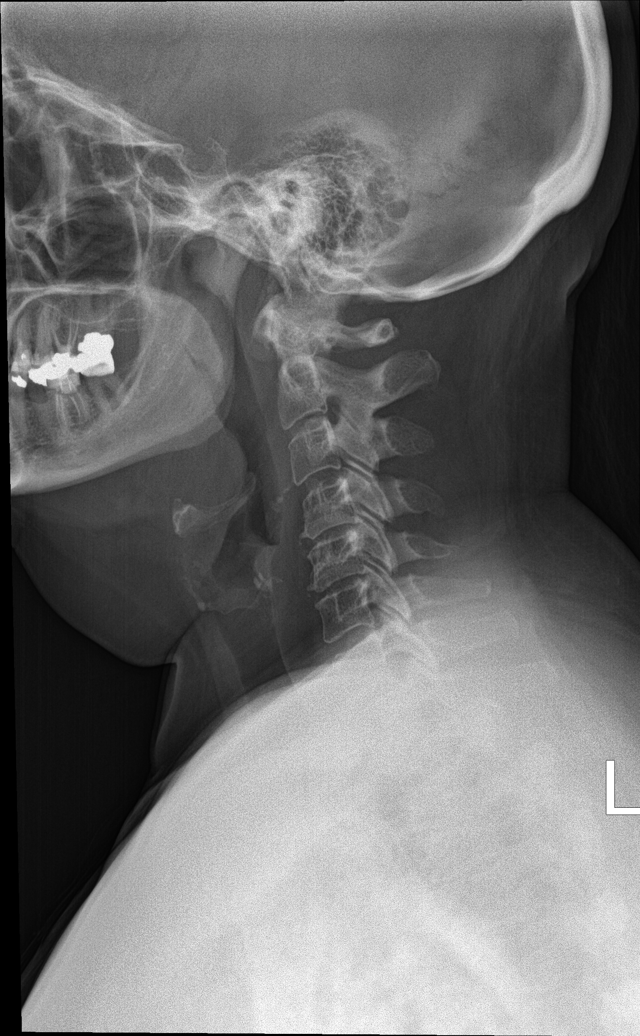

[c-spine open mouth]
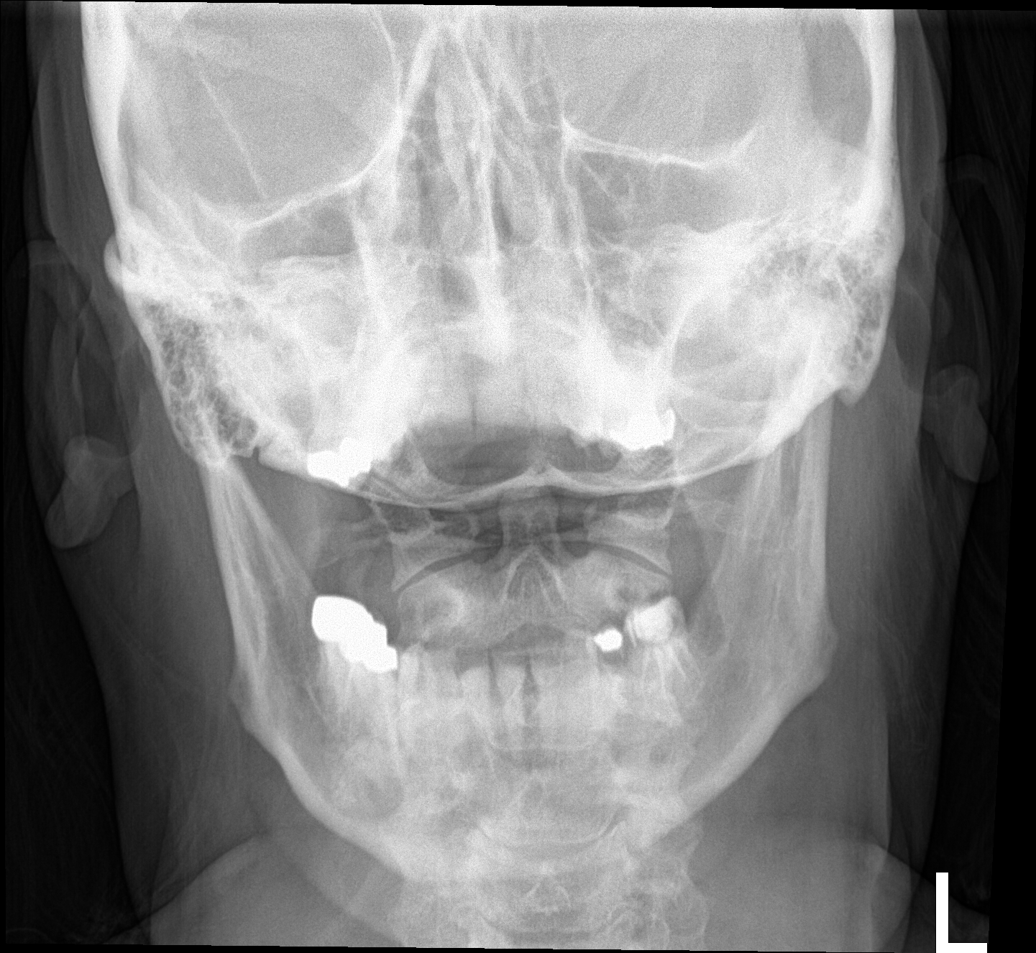

[[person_name]]
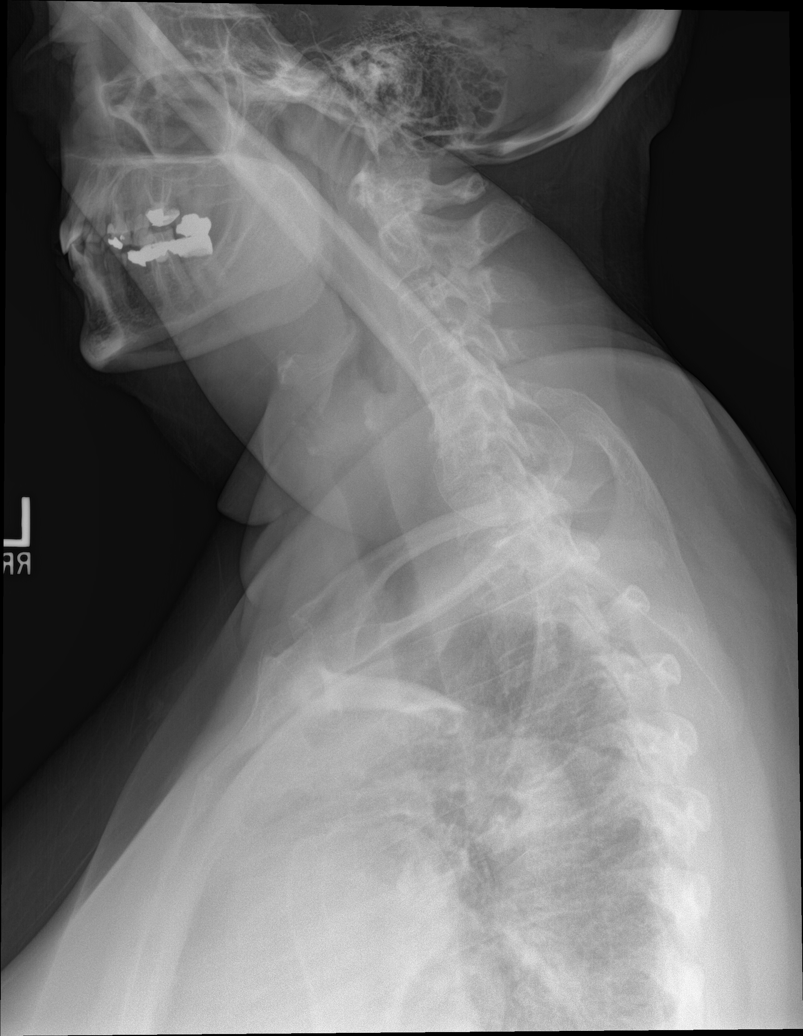

[6 of 6 positions shown; findings below may reference images not displayed]

FINDINGS: Normal alignment. No fracture. Disc space narrowing at C4-5 and
C5-6. Prevertebral soft tissues are normal.
IMPRESSION: No acute bony abnormality.

## 2018-05-11 ENCOUNTER — Encounter: Payer: Self-pay | Admitting: Family Medicine

## 2018-06-15 ENCOUNTER — Other Ambulatory Visit: Payer: Self-pay

## 2018-06-15 ENCOUNTER — Ambulatory Visit (INDEPENDENT_AMBULATORY_CARE_PROVIDER_SITE_OTHER): Payer: 59 | Admitting: Family Medicine

## 2018-06-15 ENCOUNTER — Telehealth: Payer: Self-pay

## 2018-06-15 ENCOUNTER — Encounter: Payer: Self-pay | Admitting: Family Medicine

## 2018-06-15 VITALS — BP 140/82 | HR 80 | Temp 98.3°F | Resp 16 | Ht 62.0 in | Wt 174.0 lb

## 2018-06-15 DIAGNOSIS — J0100 Acute maxillary sinusitis, unspecified: Secondary | ICD-10-CM

## 2018-06-15 DIAGNOSIS — H6980 Other specified disorders of Eustachian tube, unspecified ear: Secondary | ICD-10-CM | POA: Diagnosis not present

## 2018-06-15 MED ORDER — AMOXICILLIN-POT CLAVULANATE 875-125 MG PO TABS: 1.0000 | ORAL_TABLET | Freq: Two times a day (BID) | ORAL | 0 refills | Status: AC

## 2018-06-15 MED ORDER — BENZONATATE 100 MG PO CAPS: 100.0000 mg | ORAL_CAPSULE | Freq: Three times a day (TID) | ORAL | 0 refills | Status: DC | PRN

## 2018-06-15 MED ORDER — PREDNISONE 20 MG PO TABS
40.0000 mg | ORAL_TABLET | Freq: Every day | ORAL | 0 refills | Status: AC
Start: 2018-06-15 — End: 2018-06-20

## 2018-06-15 NOTE — Telephone Encounter (Signed)
Call placed to patient she is aware of provider recommendations  

## 2018-06-15 NOTE — Telephone Encounter (Signed)
Patient called requesting an appointment,Schedule is full patient complains of right ear ache due to a cold she has. Patient has tried claritinD,Ibuprofen and nothing is working.Patient states she gets dizzy when standing, Patient states she has No fever,no congestion, no cough,no body aches, but her face feels tight.  Patient was [facility]. but instead requested I send message to PCP to get an antibiotic

## 2018-06-15 NOTE — Telephone Encounter (Signed)
Recommend urgent care.  Sounds viral

## 2018-06-15 NOTE — Progress Notes (Signed)
Patient ID: Stephanie [name], female    DOB: 08/30/1965, 52 y.o.   MRN: [id number]  PCP: Susy Frizzle, MD  Chief Complaint  Patient presents with  . Illness    x1week- cough, R ear pain, sneezing, sinus pressure    Subjective:   Stephanie [name] is a 52 y.o. female, presents to clinic with CC of sinus congestion and URI sx x 1 month. Sinusitis  This is a new problem. The current episode started in the past 7 days. The problem has been gradually worsening since onset. There has been no fever. The pain is moderate. Associated symptoms include congestion, coughing, ear pain, headaches, a hoarse voice, sinus pressure, sneezing, a sore throat and swollen glands. Pertinent negatives include no chills, diaphoresis, neck pain or shortness of breath. Past treatments include oral decongestants (antihistaimines and steroid nasal spray, decongestants). The treatment provided no relief.  Pain is most severe to b/l maxillary sinus and behind eyes, also right ear and in front of ear and lymph nodes to right jaw and neck.  Mild scratchy throat.  Nonproductive cough started this morning but she has no shortness of breath, chest congestion, wheeze, chest pain.  Her ears feel blocked bilaterally with popping and clicking, this did worsen with going to the [place] and back over last weekend.  Swelling, redness or drainage to ears Does have a history of recurrent sinusitis, nasal allergies, and past sinus surgery    Patient Active Problem List   Diagnosis Date Noted  . Prediabetes   . Unilateral primary osteoarthritis, left knee 05/16/2017  . Unilateral primary osteoarthritis, right knee 05/16/2017  . Chest pain 11/22/2016  . Hyperlipidemia   . Hypertension   . STRICTURE AND STENOSIS OF ESOPHAGUS 02/24/2009  . GERD 02/12/2009  . NAUSEA AND VOMITING 02/12/2009  . ABDOMINAL PAIN-EPIGASTRIC 02/12/2009     Prior to Admission medications   Medication Sig Start Date End Date Taking? Authorizing  Provider  ALPRAZolam Duanne Moron) 0.5 MG tablet Take 1 tablet (0.5 mg total) by mouth 3 (three) times daily as needed for anxiety. 12/26/17  Yes Susy Frizzle, MD  lisinopril-hydrochlorothiazide (ZESTORETIC) 20-12.5 MG tablet Take 1 tablet by mouth daily. 12/26/17  Yes Susy Frizzle, MD  rosuvastatin (CRESTOR) 20 MG tablet Take 1 tablet (20 mg total) by mouth daily. 01/05/18  Yes Susy Frizzle, MD     No Known Allergies   Family History  Problem Relation Age of Onset  . Hypertension Mother   . Hyperlipidemia Mother   . Heart disease Father   . Hyperlipidemia Brother   . Hypertension Brother   . Cancer Maternal Grandmother        brain tumor  . Cancer Maternal Grandfather        retinoblastoma     Social History   Socioeconomic History  . Marital status: Married    Spouse name: Not on file  . Number of children: Not on file  . Years of education: Not on file  . Highest education level: Not on file  Occupational History  . Not on file  Social Needs  . Financial resource strain: Not on file  . Food insecurity:    Worry: Not on file    Inability: Not on file  . Transportation needs:    Medical: Not on file    Non-medical: Not on file  Tobacco Use  . Smoking status: Never Smoker  . Smokeless tobacco: Never Used  Substance and Sexual Activity  .  Alcohol use: No  . Drug use: Not on file  . Sexual activity: Yes    Comment: divorced  Lifestyle  . Physical activity:    Days per week: Not on file    Minutes per session: Not on file  . Stress: Not on file  Relationships  . Social connections:    Talks on phone: Not on file    Gets together: Not on file    Attends religious service: Not on file    Active member of club or organization: Not on file    Attends meetings of clubs or organizations: Not on file    Relationship status: Not on file  . Intimate partner violence:    Fear of current or ex partner: Not on file    Emotionally abused: Not on file     Physically abused: Not on file    Forced sexual activity: Not on file  Other Topics Concern  . Not on file  Social History Narrative   ** Merged History Encounter **         Review of Systems  Constitutional: Negative.  Negative for chills and diaphoresis.  HENT: Positive for congestion, ear pain, hoarse voice, sinus pressure, sneezing and sore throat.   Eyes: Negative.   Respiratory: Positive for cough. Negative for shortness of breath.   Cardiovascular: Negative.   Gastrointestinal: Negative.   Endocrine: Negative.   Genitourinary: Negative.   Musculoskeletal: Negative.  Negative for neck pain.  Skin: Negative.   Allergic/Immunologic: Negative.   Neurological: Positive for headaches.  Hematological: Negative.   Psychiatric/Behavioral: Negative.   All other systems reviewed and are negative.      Objective:    Vitals:   06/15/18 1049  BP: 140/82  Pulse: 80  Resp: 16  Temp: 98.3 F (36.8 C)  TempSrc: Oral  SpO2: 97%  Weight: 174 lb (78.9 kg)  Height: 5\' 2"  (1.575 m)      Physical Exam  Constitutional: She appears well-developed and well-nourished. No distress.  HENT:  Head: Normocephalic and atraumatic. Head is without right periorbital erythema and without left periorbital erythema.  Right Ear: Tympanic membrane, external ear and ear canal normal. There is tenderness. No drainage or swelling. No mastoid tenderness. Tympanic membrane is not erythematous. No middle ear effusion.  Left Ear: Tympanic membrane, external ear and ear canal normal. No drainage, swelling or tenderness. No mastoid tenderness. Tympanic membrane is not erythematous.  No middle ear effusion.  Nose: Mucosal edema and rhinorrhea present. Right sinus exhibits maxillary sinus tenderness. Right sinus exhibits no frontal sinus tenderness. Left sinus exhibits maxillary sinus tenderness. Left sinus exhibits no frontal sinus tenderness.  Mouth/Throat: Uvula is midline and mucous membranes are normal.  Mucous membranes are not pale, not dry and not cyanotic. No trismus in the jaw. No uvula swelling. Posterior oropharyngeal erythema present. No oropharyngeal exudate, posterior oropharyngeal edema or tonsillar abscesses.  Nasal mucosa bilaterally very edematous and erythematous with copious purulent discharge and congestion  Eyes: Conjunctivae are normal. Right eye exhibits no discharge. Left eye exhibits no discharge.  Neck: Trachea normal, normal range of motion, full passive range of motion without pain and phonation normal. Neck supple. No neck rigidity. No tracheal deviation, no edema and no erythema present.  Cardiovascular: Normal rate and regular rhythm. Exam reveals no gallop and no friction rub.  No murmur heard. Pulses:      Radial pulses are 2+ on the right side, and 2+ on the left side.  Pulmonary/Chest: Effort normal.  No accessory muscle usage or stridor. No tachypnea. No respiratory distress. She has no decreased breath sounds. She has no wheezes. She has no rhonchi. She has no rales.  Musculoskeletal: Normal range of motion.  Lymphadenopathy:  No discrete lymph nodes palpated to head and neck but patient notes tenderness to palpation over right preauricular area, mandibular and tonsillar lymph node areas, no cervical lymphadenopathy  Neurological: She is alert. She exhibits normal muscle tone. Coordination normal.  Skin: Skin is warm and dry. No rash noted. She is not diaphoretic.  Psychiatric: She has a normal mood and affect. Her behavior is normal.  Nursing note and vitals reviewed.         Assessment & Plan:      ICD-10-CM   1. Acute maxillary sinusitis, recurrence not specified J01.00 amoxicillin-clavulanate (AUGMENTIN) 875-125 MG tablet    predniSONE (DELTASONE) 20 MG tablet  2. Dysfunction of Eustachian tube, unspecified laterality H69.80     Patient with over 1 week of acutely worsening sinus pain pressure and congestion with associated ear pain and popping, so  has some mild URI symptoms including mild throat irritation and nonproductive cough that started this morning.  Do believe with her history of sinusitis and duration and acute worsening of symptoms that she has acute bacterial sinusitis we will treat with Augmentin and steroid burst.  She was encouraged to continue steroid nasal spray and antihistamine, avoid decongestants while taking antibiotics, add coolmist humidifier or saline spray.  Ears are normal appearing, hope will improve gradually.  I did discuss with her gradual improvement of ear symptoms and of coughing and to follow-up if anything acutely worsens   Delsa Grana, PA-C 06/15/18 10:58 AM

## 2018-07-06 ENCOUNTER — Ambulatory Visit: Payer: Managed Care, Other (non HMO) | Admitting: Family Medicine

## 2018-08-24 ENCOUNTER — Telehealth: Payer: Self-pay | Admitting: Family Medicine

## 2018-08-24 NOTE — Telephone Encounter (Signed)
CB# [phone]  Patient called in stating she would like to talk with you.

## 2018-08-24 NOTE — Telephone Encounter (Signed)
States she hasn't been sleeping well since her mother passed away and not really eating or keeping food down.

## 2018-08-28 ENCOUNTER — Other Ambulatory Visit: Payer: Self-pay | Admitting: Family Medicine

## 2018-08-28 MED ORDER — ALPRAZOLAM 0.5 MG PO TABS: 0.5000 mg | ORAL_TABLET | Freq: Three times a day (TID) | ORAL | 0 refills | Status: DC | PRN

## 2018-08-28 NOTE — Telephone Encounter (Signed)
I will send in xanax.

## 2018-08-28 NOTE — Telephone Encounter (Signed)
Pt aware.

## 2018-08-28 NOTE — Telephone Encounter (Signed)
Pt has not been able to sleep or eat we since the passing of her mother and she cries all the time. She can not take anymore time off from work but would like something for her nerves and to help her sleep.

## 2018-08-31 ENCOUNTER — Ambulatory Visit: Payer: 59 | Admitting: Family Medicine

## 2018-08-31 ENCOUNTER — Encounter: Payer: Self-pay | Admitting: Family Medicine

## 2018-08-31 VITALS — BP 120/80 | HR 85 | Temp 97.6°F | Resp 15 | Ht 62.0 in | Wt 177.0 lb

## 2018-08-31 DIAGNOSIS — L719 Rosacea, unspecified: Secondary | ICD-10-CM

## 2018-08-31 DIAGNOSIS — J0141 Acute recurrent pansinusitis: Secondary | ICD-10-CM

## 2018-08-31 MED ORDER — MONTELUKAST SODIUM 10 MG PO TABS: 10.0000 mg | ORAL_TABLET | Freq: Every day | ORAL | 3 refills | Status: DC

## 2018-08-31 MED ORDER — PREDNISONE 20 MG PO TABS
40.0000 mg | ORAL_TABLET | Freq: Every day | ORAL | 0 refills | Status: AC
Start: 2018-08-31 — End: 2018-09-05

## 2018-08-31 MED ORDER — METRONIDAZOLE 1 % EX GEL: Freq: Every day | CUTANEOUS | 0 refills | Status: DC

## 2018-08-31 MED ORDER — METRONIDAZOLE 0.75 % EX GEL: CUTANEOUS | 0 refills | Status: DC

## 2018-08-31 MED ORDER — AMOXICILLIN-POT CLAVULANATE 875-125 MG PO TABS: 1.0000 | ORAL_TABLET | Freq: Two times a day (BID) | ORAL | 0 refills | Status: DC

## 2018-08-31 MED ORDER — DOXYCYCLINE HYCLATE 100 MG PO TABS: 100.0000 mg | ORAL_TABLET | Freq: Two times a day (BID) | ORAL | 0 refills | Status: DC

## 2018-08-31 NOTE — Progress Notes (Signed)
Patient ID: Stephanie [name], female    DOB: 10-Sep-1965, 53 y.o.   MRN: [id number]  PCP: Susy Frizzle, MD  Chief Complaint  Patient presents with  . Sinusitis    Patient in today with c/o dry cough, headache,ear pain. Onset a few months ago    Subjective:   Stephanie [name] is a 53 y.o. female, presents to clinic with CC of  Recurrent sinusitis, onset this week, pain and pressure to forehead and behind eye, going into ears, with nasal discharge and generalized malaise and decreased energy.  No fever, sweats, weightloss.   For a few months she has been additionally "run down" by multiple stressors with the illness and recent passing of her mother, her son has been ill and has had multiple procedures and complications.  She feels like she has "kept things longer" because shes been run down and stressed, sleeping less.  She's had URI sx, dry cough, intermittent sore throats during these recent months and weeks.  Several weeks of URI symptoms also has associated intermittent popping and pressure in her ears sometimes causing dizziness but she denies any pain drainage or swelling.  Patient Active Problem List   Diagnosis Date Noted  . Prediabetes   . Unilateral primary osteoarthritis, left knee 05/16/2017  . Unilateral primary osteoarthritis, right knee 05/16/2017  . Chest pain 11/22/2016  . Hyperlipidemia   . Hypertension   . STRICTURE AND STENOSIS OF ESOPHAGUS 02/24/2009  . GERD 02/12/2009  . NAUSEA AND VOMITING 02/12/2009  . ABDOMINAL PAIN-EPIGASTRIC 02/12/2009     Prior to Admission medications   Medication Sig Start Date End Date Taking? Authorizing Provider  lisinopril-hydrochlorothiazide (ZESTORETIC) 20-12.5 MG tablet Take 1 tablet by mouth daily. 12/26/17  Yes Susy Frizzle, MD  rosuvastatin (CRESTOR) 20 MG tablet Take 1 tablet (20 mg total) by mouth daily. 01/05/18  Yes Susy Frizzle, MD  ALPRAZolam Duanne Moron) 0.5 MG tablet Take 1 tablet (0.5 mg total) by mouth 3  (three) times daily as needed for anxiety. Patient not taking: Reported on 08/31/2018 08/28/18   Susy Frizzle, MD     No Known Allergies   Family History  Problem Relation Age of Onset  . Hypertension Mother   . Hyperlipidemia Mother   . Heart disease Father   . Hyperlipidemia Brother   . Hypertension Brother   . Cancer Maternal Grandmother        brain tumor  . Cancer Maternal Grandfather        retinoblastoma     Social History   Socioeconomic History  . Marital status: Married    Spouse name: Not on file  . Number of children: Not on file  . Years of education: Not on file  . Highest education level: Not on file  Occupational History  . Not on file  Social Needs  . Financial resource strain: Not on file  . Food insecurity:    Worry: Not on file    Inability: Not on file  . Transportation needs:    Medical: Not on file    Non-medical: Not on file  Tobacco Use  . Smoking status: Never Smoker  . Smokeless tobacco: Never Used  Substance and Sexual Activity  . Alcohol use: No  . Drug use: Not on file  . Sexual activity: Yes    Comment: divorced  Lifestyle  . Physical activity:    Days per week: Not on file    Minutes per session: Not on file  .  Stress: Not on file  Relationships  . Social connections:    Talks on phone: Not on file    Gets together: Not on file    Attends religious service: Not on file    Active member of club or organization: Not on file    Attends meetings of clubs or organizations: Not on file    Relationship status: Not on file  . Intimate partner violence:    Fear of current or ex partner: Not on file    Emotionally abused: Not on file    Physically abused: Not on file    Forced sexual activity: Not on file  Other Topics Concern  . Not on file  Social History Narrative   ** Merged History Encounter **         Review of Systems  Constitutional: Negative.   HENT: Negative.   Eyes: Negative.   Respiratory: Negative.     Cardiovascular: Negative.   Gastrointestinal: Negative.   Endocrine: Negative.   Genitourinary: Negative.   Musculoskeletal: Negative.   Skin: Negative.   Allergic/Immunologic: Negative.   Neurological: Negative.   Hematological: Negative.   Psychiatric/Behavioral: Negative.   All other systems reviewed and are negative.      Objective:    Vitals:   08/31/18 1604  BP: 120/80  Pulse: 85  Resp: 15  Temp: 97.6 F (36.4 C)  TempSrc: Oral  SpO2: 98%  Weight: 177 lb (80.3 kg)  Height: 5\' 2"  (1.575 m)      Physical Exam Vitals signs and nursing note reviewed.  Constitutional:      General: She is not in acute distress.    Appearance: She is well-developed. She is obese. She is not ill-appearing, toxic-appearing or diaphoretic.  HENT:     Head: Normocephalic and atraumatic.     Right Ear: Hearing, tympanic membrane, ear canal and external ear normal. There is no impacted cerumen.     Left Ear: Hearing, tympanic membrane, ear canal and external ear normal. There is no impacted cerumen.     Nose: Mucosal edema, congestion and rhinorrhea present.     Right Sinus: Frontal sinus tenderness present. No maxillary sinus tenderness.     Left Sinus: Frontal sinus tenderness present. No maxillary sinus tenderness.     Mouth/Throat:     Mouth: Mucous membranes are moist. Mucous membranes are not pale.     Pharynx: Uvula midline. Posterior oropharyngeal erythema present. No oropharyngeal exudate or uvula swelling.     Tonsils: No tonsillar abscesses.  Eyes:     General:        Right eye: No discharge.        Left eye: No discharge.     Conjunctiva/sclera: Conjunctivae normal.     Pupils: Pupils are equal, round, and reactive to light.  Neck:     Musculoskeletal: Normal range of motion and neck supple.     Trachea: No tracheal deviation.  Cardiovascular:     Rate and Rhythm: Normal rate and regular rhythm.     Pulses: Normal pulses.     Heart sounds: Normal heart sounds.   Pulmonary:     Effort: Pulmonary effort is normal. No respiratory distress.     Breath sounds: Normal breath sounds. No stridor. No wheezing, rhonchi or rales.  Abdominal:     General: Bowel sounds are normal. There is no distension.     Palpations: Abdomen is soft.  Musculoskeletal: Normal range of motion.  Skin:    General: Skin is  warm and dry.     Coloration: Skin is not pale.     Findings: No rash.  Neurological:     Mental Status: She is alert.     Motor: No abnormal muscle tone.     Coordination: Coordination normal.  Psychiatric:        Behavior: Behavior normal.           Assessment & Plan:      ICD-10-CM   1. Acute recurrent pansinusitis J01.41 predniSONE (DELTASONE) 20 MG tablet    amoxicillin-clavulanate (AUGMENTIN) 875-125 MG tablet    montelukast (SINGULAIR) 10 MG tablet  2. Rosacea L71.9 metroNIDAZOLE (METROGEL) 1 % gel    metroNIDAZOLE (METROGEL) 0.75 % gel     Pt with several weeks of sinusitis, recurrent, acutely worsening with pain and pressure, difficulty sleeping, has been feeling run down and ill, also recently lost her mother and son has been ill as well.   On exam pt has signs of ABS, will tx with abx.  Advised other supportive and sx tx, encouraged her to rest as able, follow up if not improved.  She also asked for meds for rosacea - has worsened recently with nasal sx and stress.  Few erythematous papules to nose and cheeks.  Tx with topical metrogel to see if it improves.    Chronic sinus issues, add singulair to allergy meds to see if it helps with chronic sx.   Delsa Grana, PA-C 08/31/18 4:16 PM

## 2018-09-04 ENCOUNTER — Encounter: Payer: Self-pay | Admitting: Family Medicine

## 2018-09-06 ENCOUNTER — Encounter: Payer: Self-pay | Admitting: Family Medicine

## 2018-09-09 ENCOUNTER — Encounter: Payer: Self-pay | Admitting: Family Medicine

## 2018-09-10 ENCOUNTER — Other Ambulatory Visit: Payer: Self-pay | Admitting: Family Medicine

## 2018-09-10 MED ORDER — FLUCONAZOLE 150 MG PO TABS: 150.0000 mg | ORAL_TABLET | Freq: Once | ORAL | 0 refills | Status: AC

## 2018-09-13 ENCOUNTER — Other Ambulatory Visit: Payer: Self-pay | Admitting: Family Medicine

## 2018-09-13 MED ORDER — FLUCONAZOLE 150 MG PO TABS: 150.0000 mg | ORAL_TABLET | Freq: Once | ORAL | 0 refills | Status: AC

## 2018-09-13 NOTE — Telephone Encounter (Signed)
We can do another dose of diflucan - some women need 2 doses - have her use OTC monistat creams/suppositories too.   Would need to see if her this doesn't clear it up

## 2018-10-12 ENCOUNTER — Encounter: Payer: Self-pay | Admitting: Family Medicine

## 2018-11-30 ENCOUNTER — Ambulatory Visit: Payer: 59 | Admitting: Family Medicine

## 2019-01-16 ENCOUNTER — Emergency Department (HOSPITAL_COMMUNITY)
Admission: EM | Admit: 2019-01-16 | Discharge: 2019-01-16 | Disposition: A | Discharge status: Home / Self Care | Payer: 59 | Attending: Emergency Medicine | Admitting: Emergency Medicine

## 2019-01-16 ENCOUNTER — Emergency Department (HOSPITAL_COMMUNITY): Payer: 59

## 2019-01-16 ENCOUNTER — Encounter (HOSPITAL_COMMUNITY): Payer: Self-pay

## 2019-01-16 ENCOUNTER — Telehealth: Payer: Self-pay | Admitting: Family Medicine

## 2019-01-16 ENCOUNTER — Other Ambulatory Visit: Payer: Self-pay

## 2019-01-16 DIAGNOSIS — R1013 Epigastric pain: Secondary | ICD-10-CM | POA: Insufficient documentation

## 2019-01-16 DIAGNOSIS — Z79899 Other long term (current) drug therapy: Secondary | ICD-10-CM | POA: Diagnosis not present

## 2019-01-16 DIAGNOSIS — K219 Gastro-esophageal reflux disease without esophagitis: Secondary | ICD-10-CM

## 2019-01-16 DIAGNOSIS — R197 Diarrhea, unspecified: Secondary | ICD-10-CM | POA: Diagnosis not present

## 2019-01-16 DIAGNOSIS — R1010 Upper abdominal pain, unspecified: Secondary | ICD-10-CM | POA: Diagnosis present

## 2019-01-16 DIAGNOSIS — I10 Essential (primary) hypertension: Secondary | ICD-10-CM | POA: Insufficient documentation

## 2019-01-16 DIAGNOSIS — R112 Nausea with vomiting, unspecified: Secondary | ICD-10-CM | POA: Insufficient documentation

## 2019-01-16 DIAGNOSIS — R7303 Prediabetes: Secondary | ICD-10-CM | POA: Insufficient documentation

## 2019-01-16 LAB — URINALYSIS, ROUTINE W REFLEX MICROSCOPIC
Bilirubin Urine: NEGATIVE
Glucose, UA: NEGATIVE mg/dL
Hgb urine dipstick: NEGATIVE
Ketones, ur: NEGATIVE mg/dL
Leukocytes,Ua: NEGATIVE
Nitrite: NEGATIVE
Protein, ur: NEGATIVE mg/dL
Specific Gravity, Urine: 1.025 (ref 1.005–1.030)
pH: 7 (ref 5.0–8.0)

## 2019-01-16 LAB — CBC
HCT: 39.4 % (ref 36.0–46.0)
Hemoglobin: 13.4 g/dL (ref 12.0–15.0)
MCH: 31.2 pg (ref 26.0–34.0)
MCHC: 34 g/dL (ref 30.0–36.0)
MCV: 91.6 fL (ref 80.0–100.0)
Platelets: 238 10*3/uL (ref 150–400)
RBC: 4.3 MIL/uL (ref 3.87–5.11)
RDW: 12.8 % (ref 11.5–15.5)
WBC: 9.7 10*3/uL (ref 4.0–10.5)
nRBC: 0 % (ref 0.0–0.2)

## 2019-01-16 LAB — COMPREHENSIVE METABOLIC PANEL
ALT: 46 U/L — ABNORMAL HIGH (ref 0–44)
AST: 49 U/L — ABNORMAL HIGH (ref 15–41)
Albumin: 4.2 g/dL (ref 3.5–5.0)
Alkaline Phosphatase: 80 U/L (ref 38–126)
Anion gap: 9 (ref 5–15)
BUN: 6 mg/dL (ref 6–20)
CO2: 26 mmol/L (ref 22–32)
Calcium: 9 mg/dL (ref 8.9–10.3)
Chloride: 104 mmol/L (ref 98–111)
Creatinine, Ser: 0.64 mg/dL (ref 0.44–1.00)
GFR calc Af Amer: >60 mL/min (ref >60)
GFR calc non Af Amer: >60 mL/min (ref >60)
Glucose, Bld: 122 mg/dL — ABNORMAL HIGH (ref 70–99)
Potassium: 3.5 mmol/L (ref 3.5–5.1)
Sodium: 139 mmol/L (ref 135–145)
Total Bilirubin: 0.4 mg/dL (ref 0.3–1.2)
Total Protein: 8 g/dL (ref 6.5–8.1)

## 2019-01-16 LAB — LIPASE, BLOOD: Lipase: 24 U/L (ref 11–51)

## 2019-01-16 MED ORDER — ONDANSETRON 4 MG PO TBDP: 4.0000 mg | ORAL_TABLET | Freq: Three times a day (TID) | ORAL | 0 refills | Status: DC | PRN

## 2019-01-16 MED ORDER — SODIUM CHLORIDE 0.9% FLUSH: 3.0000 mL | Freq: Once | INTRAVENOUS | Status: DC

## 2019-01-16 MED ORDER — IOHEXOL 300 MG/ML  SOLN
100.0000 mL | Freq: Once | INTRAMUSCULAR | Status: AC | PRN
  Administered 2019-01-16: 13:00:00 100 mL via INTRAVENOUS

## 2019-01-16 MED ORDER — SODIUM CHLORIDE 0.9 % IV BOLUS
1000.0000 mL | Freq: Once | INTRAVENOUS | Status: AC
  Administered 2019-01-16: 1000 mL via INTRAVENOUS

## 2019-01-16 MED ORDER — PANTOPRAZOLE SODIUM 20 MG PO TBEC: 40.0000 mg | DELAYED_RELEASE_TABLET | Freq: Every day | ORAL | 0 refills | Status: DC

## 2019-01-16 MED ORDER — SODIUM CHLORIDE (PF) 0.9 % IJ SOLN
INTRAMUSCULAR | Status: AC
  Filled 2019-01-16: qty 50

## 2019-01-16 MED ORDER — DICYCLOMINE HCL 20 MG PO TABS: 20.0000 mg | ORAL_TABLET | Freq: Two times a day (BID) | ORAL | 0 refills | Status: DC

## 2019-01-16 MED ORDER — ONDANSETRON HCL 4 MG/2ML IJ SOLN
4.0000 mg | Freq: Once | INTRAMUSCULAR | Status: AC
  Administered 2019-01-16: 12:00:00 4 mg via INTRAVENOUS
  Filled 2019-01-16: qty 2

## 2019-01-16 MED ORDER — MORPHINE SULFATE (PF) 4 MG/ML IV SOLN
4.0000 mg | Freq: Once | INTRAVENOUS | Status: AC
  Administered 2019-01-16: 12:00:00 4 mg via INTRAVENOUS
  Filled 2019-01-16: qty 1

## 2019-01-16 NOTE — ED Notes (Signed)
Patient made aware urine sample is needed. 

## 2019-01-16 NOTE — ED Notes (Signed)
Patient transported to CT 

## 2019-01-16 NOTE — ED Provider Notes (Signed)
[facility] DEPT Provider Note   CSN: [id number] Arrival date & time: 01/16/19  1050    History   Chief Complaint Chief Complaint  Patient presents with  . Abdominal Pain  . Emesis  . Diarrhea    HPI Stephanie [name] is a 53 y.o. female.     HPI   53 year old female with a history of hypertension, hyperlipidemia, prediabetes, GERD, cholecystectomy presents with concern for upper abdominal pain.  Reports that the pain has been present over the last month, and it has been waxing and waning some but overall has been consistent.  Today, the pain was severe.  Reports it feels like a severe burning in the epigastrium.  The pain is worse with eating.  Reports that she eats a very small amount, sometimes she will be okay, however she if she eats anything more she has severe abdominal pain, sensation of fullness and nausea.  Reports she has intermittent nausea, vomiting and diarrhea associated with the symptoms.  For the last week, she has had dark brown to black tarry stools.  Denies any hematemesis other than a very small streak of bright red blood after multiple episodes of heaving today.  Denies fevers, dysuria, chest pain, shortness of breath.  She takes NSAIDs approximately 2-3 times per week.  Past Medical History:  Diagnosis Date  . High cholesterol   . Hyperlipidemia   . Hypertension   . Prediabetes     Patient Active Problem List   Diagnosis Date Noted  . Prediabetes   . Unilateral primary osteoarthritis, left knee 05/16/2017  . Unilateral primary osteoarthritis, right knee 05/16/2017  . Chest pain 11/22/2016  . Hyperlipidemia   . Hypertension   . STRICTURE AND STENOSIS OF ESOPHAGUS 02/24/2009  . GERD 02/12/2009  . NAUSEA AND VOMITING 02/12/2009  . ABDOMINAL PAIN-EPIGASTRIC 02/12/2009    Past Surgical History:  Procedure Laterality Date  . CESAREAN SECTION    . CHOLECYSTECTOMY    . NASAL SINUS SURGERY  2017   Dr.Byers [facility]     OB  History   No obstetric history on file.      Home Medications    Prior to Admission medications   Medication Sig Start Date End Date Taking? Authorizing Provider  lisinopril-hydrochlorothiazide (ZESTORETIC) 20-12.5 MG tablet Take 1 tablet by mouth daily. 12/26/17  Yes Susy Frizzle, MD  montelukast (SINGULAIR) 10 MG tablet Take 1 tablet (10 mg total) by mouth at bedtime. Patient taking differently: Take 10 mg by mouth at bedtime as needed (allergies).  08/31/18  Yes Delsa Grana, PA-C  rosuvastatin (CRESTOR) 20 MG tablet Take 1 tablet (20 mg total) by mouth daily. 01/05/18  Yes Susy Frizzle, MD  ALPRAZolam Duanne Moron) 0.5 MG tablet Take 1 tablet (0.5 mg total) by mouth 3 (three) times daily as needed for anxiety. Patient not taking: Reported on 01/16/2019 08/28/18   Susy Frizzle, MD  amoxicillin-clavulanate (AUGMENTIN) 875-125 MG tablet Take 1 tablet by mouth 2 (two) times daily. Patient not taking: Reported on 01/16/2019 08/31/18   Delsa Grana, PA-C  dicyclomine (BENTYL) 20 MG tablet Take 1 tablet (20 mg total) by mouth 2 (two) times daily. 01/16/19   Gareth Morgan, MD  metroNIDAZOLE (METROGEL) 0.75 % gel Apply topically to affect skin daily prn Patient not taking: Reported on 01/16/2019 08/31/18   Delsa Grana, PA-C  metroNIDAZOLE (METROGEL) 1 % gel Apply topically daily. Patient not taking: Reported on 01/16/2019 08/31/18   Delsa Grana, PA-C  ondansetron (ZOFRAN ODT)  4 MG disintegrating tablet Take 1 tablet (4 mg total) by mouth every 8 (eight) hours as needed for nausea or vomiting. 01/16/19   Gareth Morgan, MD  pantoprazole (PROTONIX) 20 MG tablet Take 2 tablets (40 mg total) by mouth daily for 14 days. 01/16/19 01/30/19  Gareth Morgan, MD    Family History Family History  Problem Relation Age of Onset  . Hypertension Mother   . Hyperlipidemia Mother   . Heart disease Father   . Hyperlipidemia Brother   . Hypertension Brother   . Cancer Maternal Grandmother        brain  tumor  . Cancer Maternal Grandfather        retinoblastoma    Social History Social History   Tobacco Use  . Smoking status: Never Smoker  . Smokeless tobacco: Never Used  Substance Use Topics  . Alcohol use: No  . Drug use: Never     Allergies   Patient has no known allergies.   Review of Systems Review of Systems  Constitutional: Negative for fever.  HENT: Negative for sore throat.   Eyes: Negative for visual disturbance.  Respiratory: Negative for cough and shortness of breath.   Cardiovascular: Negative for chest pain.  Gastrointestinal: Positive for abdominal pain, diarrhea, nausea and vomiting. Negative for constipation. Blood in stool: black stool.  Genitourinary: Negative for difficulty urinating and dysuria.  Musculoskeletal: Negative for back pain and neck pain.  Skin: Negative for rash.  Neurological: Negative for syncope and headaches.     Physical Exam Updated Vital Signs BP 132/72 (BP Location: Right Arm)   Pulse 84   Temp 98 F (36.7 C) (Oral)   Resp 15   Ht 5' 4"  (1.626 m)   Wt 79.4 kg   SpO2 98%   BMI 30.04 kg/m   Physical Exam Vitals signs and nursing note reviewed.  Constitutional:      General: She is not in acute distress.    Appearance: She is well-developed. She is not diaphoretic.  HENT:     Head: Normocephalic and atraumatic.  Eyes:     Conjunctiva/sclera: Conjunctivae normal.  Neck:     Musculoskeletal: Normal range of motion.  Cardiovascular:     Rate and Rhythm: Normal rate and regular rhythm.     Heart sounds: No murmur.  Pulmonary:     Effort: Pulmonary effort is normal. No respiratory distress.  Abdominal:     General: There is no distension.     Palpations: Abdomen is soft.     Tenderness: There is abdominal tenderness in the right upper quadrant, right lower quadrant and epigastric area. There is no guarding.  Musculoskeletal:        General: No tenderness.  Skin:    General: Skin is warm and dry.     Findings:  No erythema or rash.  Neurological:     Mental Status: She is alert and oriented to person, place, and time.      ED Treatments / Results  Labs (all labs ordered are listed, but only abnormal results are displayed) Labs Reviewed  COMPREHENSIVE METABOLIC PANEL - Abnormal; Notable for the following components:      Result Value   Glucose, Bld 122 (*)    AST 49 (*)    ALT 46 (*)    All other components within normal limits  URINALYSIS, ROUTINE W REFLEX MICROSCOPIC - Abnormal; Notable for the following components:   Color, Urine STRAW (*)    All other components within normal  limits  LIPASE, BLOOD  CBC    EKG None  Radiology Ct Abdomen Pelvis W Contrast  Result Date: 01/16/2019 CLINICAL DATA:  Upper abdominal pain for 1 month. EXAM: CT ABDOMEN AND PELVIS WITH CONTRAST TECHNIQUE: Multidetector CT imaging of the abdomen and pelvis was performed using the standard protocol following bolus administration of intravenous contrast. CONTRAST:  113m OMNIPAQUE IOHEXOL 300 MG/ML  SOLN COMPARISON:  CT scan of October 12, 2010. FINDINGS: Lower chest: No acute abnormality. Hepatobiliary: Status post cholecystectomy. No biliary dilatation is noted. Hepatic steatosis is noted. Pancreas: Unremarkable. No pancreatic ductal dilatation or surrounding inflammatory changes. Spleen: Normal in size without focal abnormality. Adrenals/Urinary Tract: Adrenal glands are unremarkable. Kidneys are normal, without renal calculi, focal lesion, or hydronephrosis. Bladder is unremarkable. Stomach/Bowel: Stomach is within normal limits. Appendix appears normal. No evidence of bowel wall thickening, distention, or inflammatory changes. Vascular/Lymphatic: Aortic atherosclerosis. No enlarged abdominal or pelvic lymph nodes. Reproductive: Uterus and bilateral adnexa are unremarkable. Other: No abdominal wall hernia or abnormality. No abdominopelvic ascites. Musculoskeletal: No acute or significant osseous findings.  IMPRESSION: Hepatic steatosis. No acute abnormality seen in the abdomen or pelvis. Aortic Atherosclerosis (ICD10-I70.0). Electronically Signed   By: JMarijo ConceptionM.D.   On: 01/16/2019 13:13    Procedures Procedures (including critical care time)  Medications Ordered in ED Medications  morphine 4 MG/ML injection 4 mg (4 mg Intravenous Given 01/16/19 1155)  sodium chloride 0.9 % bolus 1,000 mL (0 mLs Intravenous Stopped 01/16/19 1408)  ondansetron (ZOFRAN) injection 4 mg (4 mg Intravenous Given 01/16/19 1155)  iohexol (OMNIPAQUE) 300 MG/ML solution 100 mL (100 mLs Intravenous Contrast Given 01/16/19 1242)     Initial Impression / Assessment and Plan / ED Course  I have reviewed the triage vital signs and the nursing notes.  Pertinent labs & imaging results that were available during my care of the patient were reviewed by me and considered in my medical decision making (see chart for details).        53year old female with a history of hypertension, hyperlipidemia, prediabetes, GERD, cholecystectomy presents with concern for upper abdominal pain.  Given tenderness on exam and association with eating, doubt cardiac equivalent.  Labs show no evidence of pancreatitis.  Hemoglobin is normal after 1 week of symptoms, have low suspicion for clinically significant GI bleed. Transaminase very slightly above normal with otherwise normal alk phos, bli. CT abdomen pelvis shows no acute abnormalities, note hepatic steatosis. UA without infection.   Suspect most likely gastritis or PUD as etiology of symptoms however recommend close outpt follow up..  Given rx for protonix, bentyl and zofran and recommend GI follow up. Patient discharged in stable condition with understanding of reasons to return.     Final Clinical Impressions(s) / ED Diagnoses   Final diagnoses:  Gastroesophageal reflux disease, esophagitis presence not specified  Epigastric pain    ED Discharge Orders         Ordered     pantoprazole (PROTONIX) 20 MG tablet  Daily     01/16/19 1347    dicyclomine (BENTYL) 20 MG tablet  2 times daily     01/16/19 1347    ondansetron (ZOFRAN ODT) 4 MG disintegrating tablet  Every 8 hours PRN     01/16/19 1347           SGareth Morgan MD 01/16/19 2150

## 2019-01-16 NOTE — Telephone Encounter (Signed)
Pt called very tearful stating that she is burning/hurting in her abd area from right breast down to belly button area, having really dark diarrhea and vomiting yellow. Pt does not have a gallbladder. She states that her pain is a 8 out 10. Recommended pt go to the ER for evaluation. Pt verbalized understanding.

## 2019-01-16 NOTE — ED Triage Notes (Signed)
Patient c/o upper mid abdominal pain x 1 month. Patient reports a history of hiatal hernia. Patient states she has been taking OTC Prilosec. Patient called her PCP today and was told to come to the ED.  Patient also c/o intermittent N/V/D x 1 month

## 2019-01-18 ENCOUNTER — Ambulatory Visit: Payer: 59 | Admitting: Family Medicine

## 2019-02-01 ENCOUNTER — Other Ambulatory Visit: Payer: Self-pay

## 2019-02-01 ENCOUNTER — Ambulatory Visit (INDEPENDENT_AMBULATORY_CARE_PROVIDER_SITE_OTHER): Payer: 59 | Admitting: Family Medicine

## 2019-02-01 DIAGNOSIS — Z20822 Contact with and (suspected) exposure to covid-19: Secondary | ICD-10-CM

## 2019-02-01 DIAGNOSIS — Z20828 Contact with and (suspected) exposure to other viral communicable diseases: Secondary | ICD-10-CM | POA: Diagnosis not present

## 2019-02-01 NOTE — Progress Notes (Signed)
Subjective:    Patient ID: Stephanie [name], female    DOB: 1966-07-01, 53 y.o.   MRN: [id number]  HPI Patient is being seen today as a telephone visit.  Patient consents to be seen over the telephone.  Phone call began at [phone].  Phone call concluded at 435.  Patient works at [employer] at [employer].  She is wearing a mask and practicing universal precautions.  On Tuesday, patient checked into the clinic and later they determined the patient had COVID-19.  My patient did handle their insurance card.  There was no direct person-to-person contact or close contact other than my patient handling her insurance card.  She is wearing a mask constantly and used hand sanitizer immediately after handling the card.  She is completely asymptomatic at the present time.  She denies any fever chills cough body aches etc.  She is calling today questioning if she should be quarantined or if she needs a test Past Medical History:  Diagnosis Date  . High cholesterol   . Hyperlipidemia   . Hypertension   . Prediabetes    Past Surgical History:  Procedure Laterality Date  . CESAREAN SECTION    . CHOLECYSTECTOMY    . NASAL SINUS SURGERY  2017   Dr.Byers [facility]   Current Outpatient Medications on File Prior to Visit  Medication Sig Dispense Refill  . ALPRAZolam (XANAX) 0.5 MG tablet Take 1 tablet (0.5 mg total) by mouth 3 (three) times daily as needed for anxiety. (Patient not taking: Reported on 01/16/2019) 30 tablet 0  . amoxicillin-clavulanate (AUGMENTIN) 875-125 MG tablet Take 1 tablet by mouth 2 (two) times daily. (Patient not taking: Reported on 01/16/2019) 20 tablet 0  . dicyclomine (BENTYL) 20 MG tablet Take 1 tablet (20 mg total) by mouth 2 (two) times daily. 20 tablet 0  . lisinopril-hydrochlorothiazide (ZESTORETIC) 20-12.5 MG tablet Take 1 tablet by mouth daily. 90 tablet 3  . metroNIDAZOLE (METROGEL) 0.75 % gel Apply topically to affect skin daily prn (Patient not taking: Reported on 01/16/2019) 45 g  0  . metroNIDAZOLE (METROGEL) 1 % gel Apply topically daily. (Patient not taking: Reported on 01/16/2019) 45 g 0  . montelukast (SINGULAIR) 10 MG tablet Take 1 tablet (10 mg total) by mouth at bedtime. (Patient taking differently: Take 10 mg by mouth at bedtime as needed (allergies). ) 30 tablet 3  . ondansetron (ZOFRAN ODT) 4 MG disintegrating tablet Take 1 tablet (4 mg total) by mouth every 8 (eight) hours as needed for nausea or vomiting. 20 tablet 0  . pantoprazole (PROTONIX) 20 MG tablet Take 2 tablets (40 mg total) by mouth daily for 14 days. 28 tablet 0  . rosuvastatin (CRESTOR) 20 MG tablet Take 1 tablet (20 mg total) by mouth daily. 90 tablet 3   No current facility-administered medications on file prior to visit.    No Known Allergies Social History   Socioeconomic History  . Marital status: Married    Spouse name: Not on file  . Number of children: Not on file  . Years of education: Not on file  . Highest education level: Not on file  Occupational History  . Not on file  Social Needs  . Financial resource strain: Not on file  . Food insecurity    Worry: Not on file    Inability: Not on file  . Transportation needs    Medical: Not on file    Non-medical: Not on file  Tobacco Use  . Smoking status:  Never Smoker  . Smokeless tobacco: Never Used  Substance and Sexual Activity  . Alcohol use: No  . Drug use: Never  . Sexual activity: Yes    Comment: divorced  Lifestyle  . Physical activity    Days per week: Not on file    Minutes per session: Not on file  . Stress: Not on file  Relationships  . Social Herbalist on phone: Not on file    Gets together: Not on file    Attends religious service: Not on file    Active member of club or organization: Not on file    Attends meetings of clubs or organizations: Not on file    Relationship status: Not on file  . Intimate partner violence    Fear of current or ex partner: Not on file    Emotionally abused: Not  on file    Physically abused: Not on file    Forced sexual activity: Not on file  Other Topics Concern  . Not on file  Social History Narrative   ** Merged History Encounter **          Review of Systems  All other systems reviewed and are negative.      Objective:   Physical Exam   No physical exam could be performed today as patient was seen as a telephone visit     Assessment & Plan:  1. Exposure to Covid-19 Virus Patient was exposed potentially to COVID-19.  There was no close personal contact.  Patient was wearing a mask and practicing universal precautions and used hand sanitizer immediately thereafter.  She is also completely asymptomatic.  At the present time I do not feel that she needs to be quarantined.  If she develops any symptoms or fever, she would immediately quarantine herself and I would recommend testing for the virus.  However the present time I recommended that she continue universal precautions while at work and to monitor her temperature and for any symptoms.

## 2019-05-03 ENCOUNTER — Other Ambulatory Visit: Payer: Self-pay | Admitting: Family Medicine

## 2019-05-23 ENCOUNTER — Other Ambulatory Visit: Payer: Self-pay

## 2019-05-23 ENCOUNTER — Encounter: Payer: Self-pay | Admitting: Family Medicine

## 2019-05-23 ENCOUNTER — Telehealth: Payer: Self-pay | Admitting: *Deleted

## 2019-05-23 ENCOUNTER — Ambulatory Visit: Payer: 59 | Admitting: Family Medicine

## 2019-05-23 VITALS — BP 136/78 | HR 72 | Temp 98.1°F | Resp 16 | Ht 62.0 in | Wt 172.0 lb

## 2019-05-23 DIAGNOSIS — Z23 Encounter for immunization: Secondary | ICD-10-CM

## 2019-05-23 DIAGNOSIS — I10 Essential (primary) hypertension: Secondary | ICD-10-CM

## 2019-05-23 DIAGNOSIS — R7303 Prediabetes: Secondary | ICD-10-CM

## 2019-05-23 DIAGNOSIS — E785 Hyperlipidemia, unspecified: Secondary | ICD-10-CM | POA: Diagnosis not present

## 2019-05-23 NOTE — Telephone Encounter (Signed)
Received verbal orders for Cologuard.   Order placed via [employer].   Cologuard (Order [id number])

## 2019-05-23 NOTE — Progress Notes (Signed)
Subjective:    Patient ID: Stephanie [name], female    DOB: [birth year], 53 y.o.   MRN: [id number]  HPI Patient is here today for a checkup.  Her mammogram and Pap smear are performed at her gynecologist and she will have these done early next year.  She is overdue for a colonoscopy.  She declines a colonoscopy but after further discussion she does agree to a Cologuard test.  She received her flu shot today.  Overall she is doing well.  She plans on starting a new job soon.  Her blood pressure today is well controlled at 136/78.  She denies any chest pain shortness of breath or dyspnea on exertion.  She does have a history of prediabetes, fatty liver disease, and metabolic syndrome.  She is not regularly exercising.  Her BMI remains elevated at 31.  I have strongly recommended 30 minutes a day 5 days a week of aerobic exercise.  I also recommended a low saturated fat low carbohydrate diet.  She denies any myalgias or right upper quadrant pain.  She denies any polyuria, polydipsia, or blurry vision  Past Medical History:  Diagnosis Date  . High cholesterol   . Hyperlipidemia   . Hypertension   . Prediabetes    Past Surgical History:  Procedure Laterality Date  . CESAREAN SECTION    . CHOLECYSTECTOMY    . NASAL SINUS SURGERY  2017   Dr.[facility]   Current Outpatient Medications on File Prior to Visit  Medication Sig Dispense Refill  . ALPRAZolam (XANAX) 0.5 MG tablet Take 1 tablet (0.5 mg total) by mouth 3 (three) times daily as needed for anxiety. (Patient not taking: Reported on 01/16/2019) 30 tablet 0  . amoxicillin-clavulanate (AUGMENTIN) 875-125 MG tablet Take 1 tablet by mouth 2 (two) times daily. (Patient not taking: Reported on 01/16/2019) 20 tablet 0  . dicyclomine (BENTYL) 20 MG tablet Take 1 tablet (20 mg total) by mouth 2 (two) times daily. 20 tablet 0  . lisinopril-hydrochlorothiazide (ZESTORETIC) 20-12.5 MG tablet TAKE 1 TABLET BY MOUTH ONCE A DAY 90 tablet 3  . metroNIDAZOLE  (METROGEL) 0.75 % gel Apply topically to affect skin daily prn (Patient not taking: Reported on 01/16/2019) 45 g 0  . metroNIDAZOLE (METROGEL) 1 % gel Apply topically daily. (Patient not taking: Reported on 01/16/2019) 45 g 0  . montelukast (SINGULAIR) 10 MG tablet Take 1 tablet (10 mg total) by mouth at bedtime. (Patient taking differently: Take 10 mg by mouth at bedtime as needed (allergies). ) 30 tablet 3  . ondansetron (ZOFRAN ODT) 4 MG disintegrating tablet Take 1 tablet (4 mg total) by mouth every 8 (eight) hours as needed for nausea or vomiting. 20 tablet 0  . pantoprazole (PROTONIX) 20 MG tablet Take 2 tablets (40 mg total) by mouth daily for 14 days. 28 tablet 0  . rosuvastatin (CRESTOR) 20 MG tablet TAKE 1 TABLET BY MOUTH ONCE DAILY 90 tablet 3   No current facility-administered medications on file prior to visit.    No Known Allergies Social History   Socioeconomic History  . Marital status: Married    Spouse name: Not on file  . Number of children: Not on file  . Years of education: Not on file  . Highest education level: Not on file  Occupational History  . Not on file  Social Needs  . Financial resource strain: Not on file  . Food insecurity    Worry: Not on file    Inability: Not  on file  . Transportation needs    Medical: Not on file    Non-medical: Not on file  Tobacco Use  . Smoking status: Never Smoker  . Smokeless tobacco: Never Used  Substance and Sexual Activity  . Alcohol use: No  . Drug use: Never  . Sexual activity: Yes    Comment: divorced  Lifestyle  . Physical activity    Days per week: Not on file    Minutes per session: Not on file  . Stress: Not on file  Relationships  . Social Herbalist on phone: Not on file    Gets together: Not on file    Attends religious service: Not on file    Active member of club or organization: Not on file    Attends meetings of clubs or organizations: Not on file    Relationship status: Not on file  .  Intimate partner violence    Fear of current or ex partner: Not on file    Emotionally abused: Not on file    Physically abused: Not on file    Forced sexual activity: Not on file  Other Topics Concern  . Not on file  Social History Narrative   ** Merged History Encounter **         Review of Systems  All other systems reviewed and are negative.      Objective:   Physical Exam Vitals signs reviewed.  Constitutional:      General: She is not in acute distress.    Appearance: She is well-developed. She is not diaphoretic.  Cardiovascular:     Rate and Rhythm: Normal rate and regular rhythm.     Pulses: Normal pulses.     Heart sounds: Normal heart sounds. No murmur.  Pulmonary:     Effort: Pulmonary effort is normal. No respiratory distress.     Breath sounds: Normal breath sounds. No stridor. No wheezing, rhonchi or rales.  Abdominal:     General: Abdomen is flat. Bowel sounds are normal.     Palpations: Abdomen is soft.  Neurological:     General: No focal deficit present.     Mental Status: She is oriented to person, place, and time.     Cranial Nerves: No cranial nerve deficit.           Assessment & Plan:  Essential hypertension - Plan: Hemoglobin A1c, CBC with Differential/Platelet, COMPLETE METABOLIC PANEL WITH GFR, Lipid panel  Prediabetes - Plan: Hemoglobin A1c, CBC with Differential/Platelet, COMPLETE METABOLIC PANEL WITH GFR, Lipid panel  Hyperlipidemia, unspecified hyperlipidemia type - Plan: Hemoglobin A1c, CBC with Differential/Platelet, COMPLETE METABOLIC PANEL WITH GFR, Lipid panel  Needs flu shot - Plan: Flu Vaccine QUAD 36+ mos IM  We discussed her preventative care.  I recommended a Pap smear and a mammogram which the patient will schedule with her gynecologist early next year.  I recommended a colonoscopy.  She does not have time right now for colonoscopy but she will allow me to schedule her for Cologuard.  Her blood pressure today is well  controlled at 136/78.  She shows no signs or symptoms of cardiovascular disease.  Given her history of prediabetes I will check a hemoglobin A1c.  I recommended 30 minutes a day 5 days a week of aerobic exercise and a low carbohydrate diet.  I would like to see the patient lose weight to get her BMI at least to 28.  I will monitor her cholesterol by checking a  fasting lipid panel.  Ideally I like her LDL cholesterol below 100.  Patient received her flu shot today.

## 2019-05-23 NOTE — Telephone Encounter (Signed)
-----   Message from Alyson Locket, [place] sent at 05/23/2019  8:37 AM EDT -----  ----- Message ----- From: Susy Frizzle, MD Sent: 05/23/2019   8:08 AM EDT To: Alyson Locket, RMA  Please schedule for cologuard

## 2019-05-24 ENCOUNTER — Telehealth: Payer: Self-pay | Admitting: Family Medicine

## 2019-05-24 ENCOUNTER — Other Ambulatory Visit: Payer: Self-pay

## 2019-05-24 ENCOUNTER — Other Ambulatory Visit: Payer: Self-pay | Admitting: Family Medicine

## 2019-05-24 ENCOUNTER — Encounter: Payer: Self-pay | Admitting: Family Medicine

## 2019-05-24 LAB — COMPLETE METABOLIC PANEL WITH GFR
AG Ratio: 1.3 (calc) (ref 1.0–2.5)
ALT: 44 U/L — ABNORMAL HIGH (ref 6–29)
AST: 54 U/L — ABNORMAL HIGH (ref 10–35)
Albumin: 4.5 g/dL (ref 3.6–5.1)
Alkaline phosphatase (APISO): 77 U/L (ref 37–153)
BUN: 10 mg/dL (ref 7–25)
CO2: 27 mmol/L (ref 20–32)
Calcium: 9.9 mg/dL (ref 8.6–10.4)
Chloride: 98 mmol/L (ref 98–110)
Creat: 0.75 mg/dL (ref 0.50–1.05)
GFR, Est African American: 105 mL/min/{1.73_m2} (ref >=60)
GFR, Est Non African American: 91 mL/min/{1.73_m2} (ref >=60)
Globulin: 3.6 g/dL (calc) (ref 1.9–3.7)
Glucose, Bld: 132 mg/dL — ABNORMAL HIGH (ref 65–99)
Potassium: 3.9 mmol/L (ref 3.5–5.3)
Sodium: 136 mmol/L (ref 135–146)
Total Bilirubin: 0.6 mg/dL (ref 0.2–1.2)
Total Protein: 8.1 g/dL (ref 6.1–8.1)

## 2019-05-24 LAB — LIPID PANEL
Cholesterol: 164 mg/dL (ref <200)
HDL: 41 mg/dL — ABNORMAL LOW (ref >=50)
LDL Cholesterol (Calc): 104 mg/dL (calc) — ABNORMAL HIGH
Non-HDL Cholesterol (Calc): 123 mg/dL (calc) (ref <130)
Total CHOL/HDL Ratio: 4 (calc) (ref <5.0)
Triglycerides: 99 mg/dL (ref <150)

## 2019-05-24 LAB — CBC WITH DIFFERENTIAL/PLATELET
Absolute Monocytes: 485 cells/uL (ref 200–950)
Basophils Absolute: 61 cells/uL (ref 0–200)
Basophils Relative: 0.6 %
Eosinophils Absolute: 333 cells/uL (ref 15–500)
Eosinophils Relative: 3.3 %
HCT: 42.3 % (ref 35.0–45.0)
Hemoglobin: 14.2 g/dL (ref 11.7–15.5)
Lymphs Abs: 4474 cells/uL — ABNORMAL HIGH (ref 850–3900)
MCH: 31.3 pg (ref 27.0–33.0)
MCHC: 33.6 g/dL (ref 32.0–36.0)
MCV: 93.4 fL (ref 80.0–100.0)
MPV: 11 fL (ref 7.5–12.5)
Monocytes Relative: 4.8 %
Neutro Abs: 4747 cells/uL (ref 1500–7800)
Neutrophils Relative %: 47 %
Platelets: 266 10*3/uL (ref 140–400)
RBC: 4.53 10*6/uL (ref 3.80–5.10)
RDW: 12.8 % (ref 11.0–15.0)
Total Lymphocyte: 44.3 %
WBC: 10.1 10*3/uL (ref 3.8–10.8)

## 2019-05-24 LAB — HEMOGLOBIN A1C
Hgb A1c MFr Bld: 6.7 % of total Hgb — ABNORMAL HIGH (ref <5.7)
Mean Plasma Glucose: 146 (calc)
eAG (mmol/L): 8.1 (calc)

## 2019-05-24 MED ORDER — ONDANSETRON HCL 4 MG PO TABS: 4.0000 mg | ORAL_TABLET | Freq: Three times a day (TID) | ORAL | 0 refills | Status: DC | PRN

## 2019-05-24 MED ORDER — METFORMIN HCL 500 MG PO TABS: 500.0000 mg | ORAL_TABLET | Freq: Two times a day (BID) | ORAL | 0 refills | Status: DC

## 2019-05-24 NOTE — Telephone Encounter (Signed)
Patient was notified of medication being sent in and recommendations by Dr.Pickard

## 2019-05-24 NOTE — Telephone Encounter (Signed)
Patient called in stating that she has been vomiting, and having sweating and feeling fatigue since yesterday. Patient wanted to know if it was linked to her flu shot that she got as she started feeling sick as soon as she left our office. I advised patient that it is not likely that the flu shot caused symptoms as patient has been getting the flu shot for decades and never had symptoms. Advised patient to eat BRAT diet and drink plenty of clear liquids. Patient would like to know if we can call in Zofran? Would also like a work note.

## 2019-05-24 NOTE — Telephone Encounter (Signed)
[place] with work note.  Will call out zofran.  Possibly due to flu shot but if no better by tomorrow, would recommend covid test.

## 2019-05-27 ENCOUNTER — Telehealth: Payer: Self-pay | Admitting: Family Medicine

## 2019-05-27 NOTE — Telephone Encounter (Signed)
Patient would like you to call her to discuss metformin. She states she has been taking it in the morning and at lunch and wants to make sure that is okay.  CB# [phone]

## 2019-05-28 NOTE — Telephone Encounter (Signed)
LMOVM that it should be taken 12 hours apart for best results

## 2019-06-04 ENCOUNTER — Ambulatory Visit (INDEPENDENT_AMBULATORY_CARE_PROVIDER_SITE_OTHER): Payer: 59 | Admitting: Family Medicine

## 2019-06-04 ENCOUNTER — Other Ambulatory Visit: Payer: Self-pay

## 2019-06-04 DIAGNOSIS — B9689 Other specified bacterial agents as the cause of diseases classified elsewhere: Secondary | ICD-10-CM | POA: Diagnosis not present

## 2019-06-04 DIAGNOSIS — J019 Acute sinusitis, unspecified: Secondary | ICD-10-CM

## 2019-06-04 MED ORDER — CEFDINIR 300 MG PO CAPS: 300.0000 mg | ORAL_CAPSULE | Freq: Two times a day (BID) | ORAL | 0 refills | Status: DC

## 2019-06-04 NOTE — Progress Notes (Signed)
Subjective:    Patient ID: Stephanie [name], female    DOB: 08-Apr-1966, 53 y.o.   MRN: [id number]  HPI Patient is being seen today as a telephone visit.  Patient consents to be seen over the telephone.  Phone call began at 1105.  Phone call concluded at 1114.  Patient states that her symptoms began on Friday.  Throughout the weekend they intensified.  She reports pain and pressure in the left side of her face, her left upper teeth, and her left forehead.  It was an aching constant pressure similar to what she is experienced with a sinus infection in the past.  She states that the left side of her face also appears swollen.  She denies any fever.  She denies any cough.  She denies any shortness of breath.  She denies any nausea or vomiting.  She denies any change in her sense of taste or smell.  She denies any head injury. Past Medical History:  Diagnosis Date  . Diabetes mellitus type 2 in obese ([place])   . High cholesterol   . Hyperlipidemia   . Hypertension    Past Surgical History:  Procedure Laterality Date  . CESAREAN SECTION    . CHOLECYSTECTOMY    . NASAL SINUS SURGERY  2017   Dr.Byers [facility]   Current Outpatient Medications on File Prior to Visit  Medication Sig Dispense Refill  . Estradiol (DIVIGEL) 0.5 MG/0.5GM GEL Place onto the skin.    Marland Kitchen lisinopril-hydrochlorothiazide (ZESTORETIC) 20-12.5 MG tablet TAKE 1 TABLET BY MOUTH ONCE A DAY 90 tablet 3  . metFORMIN (GLUCOPHAGE) 500 MG tablet Take 1 tablet (500 mg total) by mouth 2 (two) times daily with a meal. 180 tablet 0  . montelukast (SINGULAIR) 10 MG tablet Take 1 tablet (10 mg total) by mouth at bedtime. (Patient taking differently: Take 10 mg by mouth at bedtime as needed (allergies). ) 30 tablet 3  . ondansetron (ZOFRAN ODT) 4 MG disintegrating tablet Take 1 tablet (4 mg total) by mouth every 8 (eight) hours as needed for nausea or vomiting. 20 tablet 0  . ondansetron (ZOFRAN) 4 MG tablet Take 1 tablet (4 mg total) by mouth  every 8 (eight) hours as needed for nausea or vomiting. 20 tablet 0  . pantoprazole (PROTONIX) 20 MG tablet Take 2 tablets (40 mg total) by mouth daily for 14 days. 28 tablet 0  . progesterone (PROMETRIUM) 100 MG capsule Take 100 mg by mouth daily.    . rosuvastatin (CRESTOR) 20 MG tablet TAKE 1 TABLET BY MOUTH ONCE DAILY 90 tablet 3   No current facility-administered medications on file prior to visit.    No Known Allergies Social History   Socioeconomic History  . Marital status: Married    Spouse name: Not on file  . Number of children: Not on file  . Years of education: Not on file  . Highest education level: Not on file  Occupational History  . Not on file  Social Needs  . Financial resource strain: Not on file  . Food insecurity    Worry: Not on file    Inability: Not on file  . Transportation needs    Medical: Not on file    Non-medical: Not on file  Tobacco Use  . Smoking status: Never Smoker  . Smokeless tobacco: Never Used  Substance and Sexual Activity  . Alcohol use: No  . Drug use: Never  . Sexual activity: Yes    Comment: divorced  Lifestyle  .  Physical activity    Days per week: Not on file    Minutes per session: Not on file  . Stress: Not on file  Relationships  . Social Herbalist on phone: Not on file    Gets together: Not on file    Attends religious service: Not on file    Active member of club or organization: Not on file    Attends meetings of clubs or organizations: Not on file    Relationship status: Not on file  . Intimate partner violence    Fear of current or ex partner: Not on file    Emotionally abused: Not on file    Physically abused: Not on file    Forced sexual activity: Not on file  Other Topics Concern  . Not on file  Social History Narrative   ** Merged History Encounter **          Review of Systems  All other systems reviewed and are negative.      Objective:   Physical Exam  No physical exam could  be performed today as the patient was seen as a telephone visit      Assessment & Plan:  Acute bacterial rhinosinusitis  Patient has a sinus infection.  Begin Omnicef 300 mg p.o. twice daily for 10 days.  Supplement with Flonase 2 sprays each nostril daily.

## 2019-06-06 ENCOUNTER — Ambulatory Visit: Payer: 59 | Admitting: Family Medicine

## 2019-06-12 ENCOUNTER — Telehealth: Payer: Self-pay | Admitting: Family Medicine

## 2019-06-12 MED ORDER — FLUCONAZOLE 150 MG PO TABS: 150.0000 mg | ORAL_TABLET | Freq: Once | ORAL | 0 refills | Status: AC

## 2019-06-12 NOTE — Telephone Encounter (Signed)
Medication called/sent to requested pharmacy and pt aware via vm 

## 2019-06-12 NOTE — Telephone Encounter (Signed)
Patient is calling to say that she has yeast infection from the antibiotic that was called in for her  She would like diflucan called into the [facility] in [place] store phone number is [phone]

## 2019-06-14 ENCOUNTER — Other Ambulatory Visit: Payer: Self-pay | Admitting: Family Medicine

## 2019-06-14 ENCOUNTER — Telehealth: Payer: Self-pay | Admitting: Family Medicine

## 2019-06-14 ENCOUNTER — Other Ambulatory Visit: Payer: Self-pay

## 2019-06-14 MED ORDER — METRONIDAZOLE 500 MG PO TABS: 500.0000 mg | ORAL_TABLET | Freq: Two times a day (BID) | ORAL | 0 refills | Status: DC

## 2019-06-14 NOTE — Telephone Encounter (Signed)
Patient called in requesting another medication be called in for a yeast infection. Patient states that Diflucan x 1 did not work and she still has itching and burning. Please advise?

## 2019-06-14 NOTE — Telephone Encounter (Signed)
Diflucan should treat a yeast infection.  Therefore this may not be a yeast infection.  It could be BV.  However it is impossible to tell unless we take a sample.  I will call out Flagyl 500 mg twice daily for 7 days for BV.  If not better she will need to be seen for a wet prep

## 2019-06-14 NOTE — Telephone Encounter (Signed)
Spoke with patient and informed her of recommendations by Dr.Pickard and medication that was sent in. Patient verbalized understanding

## 2019-07-15 ENCOUNTER — Encounter: Payer: Self-pay | Admitting: Family Medicine

## 2019-07-23 ENCOUNTER — Ambulatory Visit (INDEPENDENT_AMBULATORY_CARE_PROVIDER_SITE_OTHER): Payer: 59 | Admitting: Family Medicine

## 2019-07-23 ENCOUNTER — Other Ambulatory Visit: Payer: Self-pay

## 2019-07-23 DIAGNOSIS — J019 Acute sinusitis, unspecified: Secondary | ICD-10-CM

## 2019-07-23 DIAGNOSIS — B9689 Other specified bacterial agents as the cause of diseases classified elsewhere: Secondary | ICD-10-CM

## 2019-07-23 MED ORDER — AMOXICILLIN-POT CLAVULANATE 875-125 MG PO TABS: 1.0000 | ORAL_TABLET | Freq: Two times a day (BID) | ORAL | 0 refills | Status: DC

## 2019-07-23 NOTE — Progress Notes (Signed)
Subjective:    Patient ID: Stephanie [name], female    DOB: 24-Nov-1965, 53 y.o.   MRN: [id number]  HPI Patient is being seen today as a telephone visit.  Patient consents to be seen by telephone.  Phone call began at 1105.  Phone call concluded 1117.  Patient states symptoms began 4 days ago.  Symptoms include swelling around her house.  She reports lymphadenopathy on the left side of her neck.  She reports pain in her left forehead and on the left side of her face.  She has had similar symptoms in the past with sinus infections.  She reports rhinorrhea and head congestion.  She reports no cough.  She reports no fever.  She reports no shortness of breath or sore throat.  She does report head congestion unrelieved by Flonase. Past Medical History:  Diagnosis Date  . Diabetes mellitus type 2 in obese ([place])   . High cholesterol   . Hyperlipidemia   . Hypertension    Past Surgical History:  Procedure Laterality Date  . CESAREAN SECTION    . CHOLECYSTECTOMY    . NASAL SINUS SURGERY  2017   Dr.Byers [facility]   Current Outpatient Medications on File Prior to Visit  Medication Sig Dispense Refill  . cefdinir (OMNICEF) 300 MG capsule Take 1 capsule (300 mg total) by mouth 2 (two) times daily. 20 capsule 0  . Estradiol (DIVIGEL) 0.5 MG/0.5GM GEL Place onto the skin.    Marland Kitchen lisinopril-hydrochlorothiazide (ZESTORETIC) 20-12.5 MG tablet TAKE 1 TABLET BY MOUTH ONCE A DAY 90 tablet 3  . metFORMIN (GLUCOPHAGE) 500 MG tablet Take 1 tablet (500 mg total) by mouth 2 (two) times daily with a meal. 180 tablet 0  . metroNIDAZOLE (FLAGYL) 500 MG tablet Take 1 tablet (500 mg total) by mouth 2 (two) times daily. 14 tablet 0  . montelukast (SINGULAIR) 10 MG tablet Take 1 tablet (10 mg total) by mouth at bedtime. (Patient taking differently: Take 10 mg by mouth at bedtime as needed (allergies). ) 30 tablet 3  . ondansetron (ZOFRAN ODT) 4 MG disintegrating tablet Take 1 tablet (4 mg total) by mouth every 8 (eight) hours  as needed for nausea or vomiting. 20 tablet 0  . ondansetron (ZOFRAN) 4 MG tablet Take 1 tablet (4 mg total) by mouth every 8 (eight) hours as needed for nausea or vomiting. 20 tablet 0  . pantoprazole (PROTONIX) 20 MG tablet Take 2 tablets (40 mg total) by mouth daily for 14 days. 28 tablet 0  . progesterone (PROMETRIUM) 100 MG capsule Take 100 mg by mouth daily.    . rosuvastatin (CRESTOR) 20 MG tablet TAKE 1 TABLET BY MOUTH ONCE DAILY 90 tablet 3   No current facility-administered medications on file prior to visit.    No Known Allergies Social History   Socioeconomic History  . Marital status: Married    Spouse name: Not on file  . Number of children: Not on file  . Years of education: Not on file  . Highest education level: Not on file  Occupational History  . Not on file  Social Needs  . Financial resource strain: Not on file  . Food insecurity    Worry: Not on file    Inability: Not on file  . Transportation needs    Medical: Not on file    Non-medical: Not on file  Tobacco Use  . Smoking status: Never Smoker  . Smokeless tobacco: Never Used  Substance and Sexual Activity  .  Alcohol use: No  . Drug use: Never  . Sexual activity: Yes    Comment: divorced  Lifestyle  . Physical activity    Days per week: Not on file    Minutes per session: Not on file  . Stress: Not on file  Relationships  . Social Herbalist on phone: Not on file    Gets together: Not on file    Attends religious service: Not on file    Active member of club or organization: Not on file    Attends meetings of clubs or organizations: Not on file    Relationship status: Not on file  . Intimate partner violence    Fear of current or ex partner: Not on file    Emotionally abused: Not on file    Physically abused: Not on file    Forced sexual activity: Not on file  Other Topics Concern  . Not on file  Social History Narrative   ** Merged History Encounter **          Review of  Systems  All other systems reviewed and are negative.      Objective:   Physical Exam  No physical exam could be performed today as the patient was seen as a telephone visit      Assessment & Plan:  Acute bacterial rhinosinusitis  Symptoms sound like a sinus infection.  However they can also be due to a viral upper respiratory infection and given the current rapid spread of Covid in the community, I have recommended that the patient be tested.  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  Therefore I recommended that she quarantine at home and go to be tested for Covid today at the [facility] location.  However based on her symptoms I do believe that she has a sinus infection so I will give the patient Augmentin 875 mg twice daily for 10 days.  Obviously if Covid test is positive I will discontinue the antibiotics and treat the patient appropriately.  Patient is in agreement with this plan.

## 2019-08-19 ENCOUNTER — Other Ambulatory Visit: Payer: Self-pay

## 2019-08-19 ENCOUNTER — Ambulatory Visit: Payer: HRSA Program | Attending: Internal Medicine

## 2019-08-19 ENCOUNTER — Ambulatory Visit (INDEPENDENT_AMBULATORY_CARE_PROVIDER_SITE_OTHER): Payer: Self-pay | Admitting: Family Medicine

## 2019-08-19 DIAGNOSIS — U071 COVID-19: Secondary | ICD-10-CM

## 2019-08-19 DIAGNOSIS — Z20828 Contact with and (suspected) exposure to other viral communicable diseases: Secondary | ICD-10-CM | POA: Diagnosis present

## 2019-08-19 DIAGNOSIS — J069 Acute upper respiratory infection, unspecified: Secondary | ICD-10-CM

## 2019-08-19 DIAGNOSIS — Z20822 Contact with and (suspected) exposure to covid-19: Secondary | ICD-10-CM

## 2019-08-19 HISTORY — DX: COVID-19: U07.1

## 2019-08-19 NOTE — Progress Notes (Signed)
Subjective:    Patient ID: Stephanie [name], female    DOB: 1965/11/14, 53 y.o.   MRN: [id number]  HPI Patient is being seen today as a telephone visit.  She consents to be seen via telephone.  Phone call began at 923.  Phone call concluded at 932.  Patient was treated for sinus infection in October and also in December of this year.  She states that she thinks she has a sinus infection again.  When asked the patient to be specific about her symptoms, she states that her symptoms began yesterday with a sore throat.  She also reports lymphadenopathy in her neck.  She developed a headache last night with postnasal drip and rhinorrhea causing a nonproductive cough.  This morning she has a fever to 100.1 prompting her to make an appointment.  She denies any chest pain or shortness of breath or pleurisy or purulent sputum.  She actually denies any pain in her sinuses other than the dull constant headache this diffuse.  She denies any change in her sense of smell or taste or nausea or vomiting or diarrhea. Past Medical History:  Diagnosis Date  . Diabetes mellitus type 2 in obese ([place])   . High cholesterol   . Hyperlipidemia   . Hypertension    Past Surgical History:  Procedure Laterality Date  . CESAREAN SECTION    . CHOLECYSTECTOMY    . NASAL SINUS SURGERY  2017   Dr.Byers [facility]   Current Outpatient Medications on File Prior to Visit  Medication Sig Dispense Refill  . amoxicillin-clavulanate (AUGMENTIN) 875-125 MG tablet Take 1 tablet by mouth 2 (two) times daily. 20 tablet 0  . cefdinir (OMNICEF) 300 MG capsule Take 1 capsule (300 mg total) by mouth 2 (two) times daily. 20 capsule 0  . Estradiol (DIVIGEL) 0.5 MG/0.5GM GEL Place onto the skin.    Marland Kitchen lisinopril-hydrochlorothiazide (ZESTORETIC) 20-12.5 MG tablet TAKE 1 TABLET BY MOUTH ONCE A DAY 90 tablet 3  . metFORMIN (GLUCOPHAGE) 500 MG tablet Take 1 tablet (500 mg total) by mouth 2 (two) times daily with a meal. 180 tablet 0  . metroNIDAZOLE  (FLAGYL) 500 MG tablet Take 1 tablet (500 mg total) by mouth 2 (two) times daily. 14 tablet 0  . montelukast (SINGULAIR) 10 MG tablet Take 1 tablet (10 mg total) by mouth at bedtime. (Patient taking differently: Take 10 mg by mouth at bedtime as needed (allergies). ) 30 tablet 3  . ondansetron (ZOFRAN ODT) 4 MG disintegrating tablet Take 1 tablet (4 mg total) by mouth every 8 (eight) hours as needed for nausea or vomiting. 20 tablet 0  . ondansetron (ZOFRAN) 4 MG tablet Take 1 tablet (4 mg total) by mouth every 8 (eight) hours as needed for nausea or vomiting. 20 tablet 0  . pantoprazole (PROTONIX) 20 MG tablet Take 2 tablets (40 mg total) by mouth daily for 14 days. 28 tablet 0  . progesterone (PROMETRIUM) 100 MG capsule Take 100 mg by mouth daily.    . rosuvastatin (CRESTOR) 20 MG tablet TAKE 1 TABLET BY MOUTH ONCE DAILY 90 tablet 3   No current facility-administered medications on file prior to visit.   No Known Allergies Social History   Socioeconomic History  . Marital status: Married    Spouse name: Not on file  . Number of children: Not on file  . Years of education: Not on file  . Highest education level: Not on file  Occupational History  . Not on file  Tobacco Use  . Smoking status: Never Smoker  . Smokeless tobacco: Never Used  Substance and Sexual Activity  . Alcohol use: No  . Drug use: Never  . Sexual activity: Yes    Comment: divorced  Other Topics Concern  . Not on file  Social History Narrative   ** Merged History Encounter **       Social Determinants of Health   Financial Resource Strain:   . Difficulty of Paying Living Expenses: Not on file  Food Insecurity:   . Worried About Charity fundraiser in the Last Year: Not on file  . Ran Out of Food in the Last Year: Not on file  Transportation Needs:   . Lack of Transportation (Medical): Not on file  . Lack of Transportation (Non-Medical): Not on file  Physical Activity:   . Days of Exercise per Week: Not  on file  . Minutes of Exercise per Session: Not on file  Stress:   . Feeling of Stress : Not on file  Social Connections:   . Frequency of Communication with Friends and Family: Not on file  . Frequency of Social Gatherings with Friends and Family: Not on file  . Attends Religious Services: Not on file  . Active Member of Clubs or Organizations: Not on file  . Attends Archivist Meetings: Not on file  . Marital Status: Not on file  Intimate Partner Violence:   . Fear of Current or Ex-Partner: Not on file  . Emotionally Abused: Not on file  . Physically Abused: Not on file  . Sexually Abused: Not on file      Review of Systems  All other systems reviewed and are negative.      Objective:   Physical Exam  Patient sounds audibly congested over the telephone but has no shortness of breath no cough      Assessment & Plan:  URI, acute  Symptoms have been present for less than 24 hours.  Therefore I do not feel that this is likely a sinus infection.  Instead I feel that she most likely has a viral upper respiratory infection.  Patient works in a medical office and therefore I recommended that she be screened for COVID-19.  We discussed how to schedule an online appointment to do this.  I recommended that she quarantine from work until her test results return negative.  She can treat her symptoms with Tylenol for fever or headache, Mucinex for cough or chest congestion, and Sudafed for head congestion.  Seek medical attention immediately if she develops shortness of breath.  I explained the most viruses take 5 to 7 days to improve.

## 2019-08-21 LAB — NOVEL CORONAVIRUS, NAA: SARS-CoV-2, NAA: DETECTED — AB

## 2019-08-22 ENCOUNTER — Ambulatory Visit (INDEPENDENT_AMBULATORY_CARE_PROVIDER_SITE_OTHER): Payer: Self-pay | Admitting: Family Medicine

## 2019-08-22 ENCOUNTER — Other Ambulatory Visit: Payer: Self-pay

## 2019-08-22 DIAGNOSIS — U071 COVID-19: Secondary | ICD-10-CM

## 2019-08-22 MED ORDER — HYDROCODONE-HOMATROPINE 5-1.5 MG/5ML PO SYRP: 5.0000 mL | ORAL_SOLUTION | Freq: Three times a day (TID) | ORAL | 0 refills | Status: DC | PRN

## 2019-08-22 NOTE — Progress Notes (Signed)
Subjective:    Patient ID: Stephanie [name], female    DOB: November 11, 1965, 53 y.o.   MRN: [id number]  HPI Patient is being seen today as a telephone visit.  She consents to be seen via telephone.  Phone call began at 1109.  Phone call concluded at 1123.  Patient was diagnosed with Covid on December 28.  She reports today feeling dizzy.  She also is having nausea and occasional diarrhea.  She reports subjective fevers as well as feeling fatigued.  She reports no sense of smell or taste.  She has an occasional cough.  She denies any chest pain.  She denies any shortness of breath.  She denies any dyspnea on exertion.  She denies any increased work of breathing.  She is requesting something for cough.  She is no longer taking Augmentin although listed on her medicine list Past Medical History:  Diagnosis Date  . Diabetes mellitus type 2 in obese ([place])   . High cholesterol   . Hyperlipidemia   . Hypertension    Past Surgical History:  Procedure Laterality Date  . CESAREAN SECTION    . CHOLECYSTECTOMY    . NASAL SINUS SURGERY  2017   Dr.Byers [facility]   Current Outpatient Medications on File Prior to Visit  Medication Sig Dispense Refill  . amoxicillin-clavulanate (AUGMENTIN) 875-125 MG tablet Take 1 tablet by mouth 2 (two) times daily. 20 tablet 0  . cefdinir (OMNICEF) 300 MG capsule Take 1 capsule (300 mg total) by mouth 2 (two) times daily. 20 capsule 0  . Estradiol (DIVIGEL) 0.5 MG/0.5GM GEL Place onto the skin.    Marland Kitchen lisinopril-hydrochlorothiazide (ZESTORETIC) 20-12.5 MG tablet TAKE 1 TABLET BY MOUTH ONCE A DAY 90 tablet 3  . metFORMIN (GLUCOPHAGE) 500 MG tablet Take 1 tablet (500 mg total) by mouth 2 (two) times daily with a meal. 180 tablet 0  . metroNIDAZOLE (FLAGYL) 500 MG tablet Take 1 tablet (500 mg total) by mouth 2 (two) times daily. 14 tablet 0  . montelukast (SINGULAIR) 10 MG tablet Take 1 tablet (10 mg total) by mouth at bedtime. (Patient taking differently: Take 10 mg by mouth at  bedtime as needed (allergies). ) 30 tablet 3  . ondansetron (ZOFRAN ODT) 4 MG disintegrating tablet Take 1 tablet (4 mg total) by mouth every 8 (eight) hours as needed for nausea or vomiting. 20 tablet 0  . ondansetron (ZOFRAN) 4 MG tablet Take 1 tablet (4 mg total) by mouth every 8 (eight) hours as needed for nausea or vomiting. 20 tablet 0  . pantoprazole (PROTONIX) 20 MG tablet Take 2 tablets (40 mg total) by mouth daily for 14 days. 28 tablet 0  . progesterone (PROMETRIUM) 100 MG capsule Take 100 mg by mouth daily.    . rosuvastatin (CRESTOR) 20 MG tablet TAKE 1 TABLET BY MOUTH ONCE DAILY 90 tablet 3   No current facility-administered medications on file prior to visit.   No Known Allergies Social History   Socioeconomic History  . Marital status: Married    Spouse name: Not on file  . Number of children: Not on file  . Years of education: Not on file  . Highest education level: Not on file  Occupational History  . Not on file  Tobacco Use  . Smoking status: Never Smoker  . Smokeless tobacco: Never Used  Substance and Sexual Activity  . Alcohol use: No  . Drug use: Never  . Sexual activity: Yes    Comment: divorced  Other  Topics Concern  . Not on file  Social History Narrative   ** Merged History Encounter **       Social Determinants of Health   Financial Resource Strain:   . Difficulty of Paying Living Expenses: Not on file  Food Insecurity:   . Worried About Charity fundraiser in the Last Year: Not on file  . Ran Out of Food in the Last Year: Not on file  Transportation Needs:   . Lack of Transportation (Medical): Not on file  . Lack of Transportation (Non-Medical): Not on file  Physical Activity:   . Days of Exercise per Week: Not on file  . Minutes of Exercise per Session: Not on file  Stress:   . Feeling of Stress : Not on file  Social Connections:   . Frequency of Communication with Friends and Family: Not on file  . Frequency of Social Gatherings with  Friends and Family: Not on file  . Attends Religious Services: Not on file  . Active Member of Clubs or Organizations: Not on file  . Attends Archivist Meetings: Not on file  . Marital Status: Not on file  Intimate Partner Violence:   . Fear of Current or Ex-Partner: Not on file  . Emotionally Abused: Not on file  . Physically Abused: Not on file  . Sexually Abused: Not on file      Review of Systems  All other systems reviewed and are negative.      Objective:   Physical Exam  Patient sounds audibly congested over the telephone but has no shortness of breath no cough      Assessment & Plan:  COVID-19  Patient has COVID-19 with cough.  She denies any shortness of breath.  She denies any criteria that would warrant hospitalization or emergency room evaluation.  Therefore I recommended supportive care.  She can certainly supplement with vitamin D, vitamin C, and zinc.  She can also use Hycodan 1 teaspoon every 6-8 hours as needed for cough.  I recommended that she push fluids.  Otherwise I recommended tincture of time.  If she develops any chest pain or shortness of breath she is directed to go immediately to the hospital.  She has a pulse oximeter at home and I recommended that she go to the emergency room if her pulse oximetry falls below 90%.

## 2019-08-24 ENCOUNTER — Other Ambulatory Visit: Payer: Self-pay | Admitting: Family Medicine

## 2019-08-26 ENCOUNTER — Other Ambulatory Visit: Payer: Self-pay

## 2019-08-26 ENCOUNTER — Other Ambulatory Visit: Payer: 59

## 2019-08-26 ENCOUNTER — Ambulatory Visit (INDEPENDENT_AMBULATORY_CARE_PROVIDER_SITE_OTHER): Payer: BC Managed Care – PPO | Admitting: Family Medicine

## 2019-08-26 DIAGNOSIS — R197 Diarrhea, unspecified: Secondary | ICD-10-CM

## 2019-08-26 DIAGNOSIS — R110 Nausea: Secondary | ICD-10-CM

## 2019-08-26 DIAGNOSIS — U071 COVID-19: Secondary | ICD-10-CM

## 2019-08-26 NOTE — Progress Notes (Signed)
Subjective:    Patient ID: Stephanie [name], female    DOB: 06-Nov-1965, 54 y.o.   MRN: [id number]  HPI  08/22/19 Patient is being seen today as a telephone visit.  She consents to be seen via telephone.  Phone call began at 1109.  Phone call concluded at 1123.  Patient was diagnosed with Covid on December 28.  She reports today feeling dizzy.  She also is having nausea and occasional diarrhea.  She reports subjective fevers as well as feeling fatigued.  She reports no sense of smell or taste.  She has an occasional cough.  She denies any chest pain.  She denies any shortness of breath.  She denies any dyspnea on exertion.  She denies any increased work of breathing.  She is requesting something for cough.  She is no longer taking Augmentin although listed on her medicine list. Patient has COVID-19 with cough.  She denies any shortness of breath.  She denies any criteria that would warrant hospitalization or emergency room evaluation.  Therefore I recommended supportive care.  She can certainly supplement with vitamin D, vitamin C, and zinc.  She can also use Hycodan 1 teaspoon every 6-8 hours as needed for cough.  I recommended that she push fluids.  Otherwise I recommended tincture of time.  If she develops any chest pain or shortness of breath she is directed to go immediately to the hospital.  She has a pulse oximeter at home and I recommended that she go to the emergency room if her pulse oximetry falls below 90%.  08/25/18  Patient called today requesting a consultation via telephone.  Phone call began at 1004.  Phone call concluded at 1013.  Patient states that she continues to experience body aches.  She had a low-grade temperature yesterday but nothing greater than 100.  She reports a dull headache.  She reports pain in her teeth.  She reports pressure in her ears.  She reports pressure in her sinuses.  She also reports diffuse body aches all throughout her body.  She denies any significant  shortness of breath.  She denies any pleurisy.  She denies any purulent sputum or hemoptysis.  She does report fatigue.  She denies any hypoxia.  She has been consistently checking her pulse oximetry and has been running in the 90s.  Blood sugars this morning are in the 140s.  She denies any symptoms that would suggest a pulmonary embolism, secondary pneumonia, or hypoxic respiratory failure. Past Medical History:  Diagnosis Date  . Diabetes mellitus type 2 in obese ([place])   . High cholesterol   . Hyperlipidemia   . Hypertension    Past Surgical History:  Procedure Laterality Date  . CESAREAN SECTION    . CHOLECYSTECTOMY    . NASAL SINUS SURGERY  2017   Dr[facility]   Current Outpatient Medications on File Prior to Visit  Medication Sig Dispense Refill  . amoxicillin-clavulanate (AUGMENTIN) 875-125 MG tablet Take 1 tablet by mouth 2 (two) times daily. 20 tablet 0  . cefdinir (OMNICEF) 300 MG capsule Take 1 capsule (300 mg total) by mouth 2 (two) times daily. 20 capsule 0  . Estradiol (DIVIGEL) 0.5 MG/0.5GM GEL Place onto the skin.    Marland Kitchen HYDROcodone-homatropine (HYCODAN) 5-1.5 MG/5ML syrup Take 5 mLs by mouth every 8 (eight) hours as needed for cough. 120 mL 0  . lisinopril-hydrochlorothiazide (ZESTORETIC) 20-12.5 MG tablet TAKE 1 TABLET BY MOUTH ONCE A DAY 90 tablet 3  . metFORMIN (GLUCOPHAGE)  500 MG tablet Take 1 tablet (500 mg total) by mouth 2 (two) times daily with a meal. 180 tablet 0  . metroNIDAZOLE (FLAGYL) 500 MG tablet Take 1 tablet (500 mg total) by mouth 2 (two) times daily. 14 tablet 0  . montelukast (SINGULAIR) 10 MG tablet Take 1 tablet (10 mg total) by mouth at bedtime. (Patient taking differently: Take 10 mg by mouth at bedtime as needed (allergies). ) 30 tablet 3  . ondansetron (ZOFRAN ODT) 4 MG disintegrating tablet Take 1 tablet (4 mg total) by mouth every 8 (eight) hours as needed for nausea or vomiting. 20 tablet 0  . ondansetron (ZOFRAN) 4 MG tablet Take 1 tablet (4  mg total) by mouth every 8 (eight) hours as needed for nausea or vomiting. 20 tablet 0  . pantoprazole (PROTONIX) 20 MG tablet Take 2 tablets (40 mg total) by mouth daily for 14 days. 28 tablet 0  . progesterone (PROMETRIUM) 100 MG capsule Take 100 mg by mouth daily.    . rosuvastatin (CRESTOR) 20 MG tablet TAKE 1 TABLET BY MOUTH ONCE DAILY 90 tablet 3   No current facility-administered medications on file prior to visit.   No Known Allergies Social History   Socioeconomic History  . Marital status: Married    Spouse name: Not on file  . Number of children: Not on file  . Years of education: Not on file  . Highest education level: Not on file  Occupational History  . Not on file  Tobacco Use  . Smoking status: Never Smoker  . Smokeless tobacco: Never Used  Substance and Sexual Activity  . Alcohol use: No  . Drug use: Never  . Sexual activity: Yes    Comment: divorced  Other Topics Concern  . Not on file  Social History Narrative   ** Merged History Encounter **       Social Determinants of Health   Financial Resource Strain:   . Difficulty of Paying Living Expenses: Not on file  Food Insecurity:   . Worried About Charity fundraiser in the Last Year: Not on file  . Ran Out of Food in the Last Year: Not on file  Transportation Needs:   . Lack of Transportation (Medical): Not on file  . Lack of Transportation (Non-Medical): Not on file  Physical Activity:   . Days of Exercise per Week: Not on file  . Minutes of Exercise per Session: Not on file  Stress:   . Feeling of Stress : Not on file  Social Connections:   . Frequency of Communication with Friends and Family: Not on file  . Frequency of Social Gatherings with Friends and Family: Not on file  . Attends Religious Services: Not on file  . Active Member of Clubs or Organizations: Not on file  . Attends Archivist Meetings: Not on file  . Marital Status: Not on file  Intimate Partner Violence:   . Fear  of Current or Ex-Partner: Not on file  . Emotionally Abused: Not on file  . Physically Abused: Not on file  . Sexually Abused: Not on file      Review of Systems  All other systems reviewed and are negative.      Objective:   Physical Exam  Patient sounds audibly congested over the telephone but has no shortness of breath no cough      Assessment & Plan:  COVID-19  Caution the patient that symptoms can take 10 to 14 days to  gradually improve.  She is approximately 7 days and therefore I feel that she is following the normal trajectory of this virus.  Recommended that she continue to quarantine for the rest of the week.  If she develops worsening chest pain or shortness of breath she should go to the hospital however the present time I would recommend symptomatic therapy only including Mucinex for chest congestion, Tylenol and/or ibuprofen for body aches and fever and rest.  However the patient actually sounds better to me today than she did last week.

## 2019-08-27 ENCOUNTER — Encounter: Payer: Self-pay | Admitting: Family Medicine

## 2019-08-28 ENCOUNTER — Telehealth: Payer: Self-pay | Admitting: Family Medicine

## 2019-08-28 NOTE — Telephone Encounter (Signed)
Patient called office she hasn't been running a fever. She states that her sister in law was just diagnosed with cancer. She is supposed to go and see her on Saturday she would like to know if you think that is okay. She will wear her mask and take precautions.  CB# [phone]

## 2019-08-30 ENCOUNTER — Encounter: Payer: Self-pay | Admitting: Family Medicine

## 2019-09-03 ENCOUNTER — Other Ambulatory Visit: Payer: Self-pay | Admitting: Family Medicine

## 2019-09-03 DIAGNOSIS — J0141 Acute recurrent pansinusitis: Secondary | ICD-10-CM

## 2019-09-13 ENCOUNTER — Ambulatory Visit: Payer: Self-pay | Admitting: Family Medicine

## 2019-09-19 ENCOUNTER — Ambulatory Visit: Payer: BC Managed Care – PPO | Admitting: Family Medicine

## 2019-09-19 ENCOUNTER — Encounter: Payer: Self-pay | Admitting: Family Medicine

## 2019-09-19 ENCOUNTER — Other Ambulatory Visit: Payer: Self-pay

## 2019-09-19 VITALS — BP 128/72 | HR 76 | Temp 96.7°F | Resp 16 | Ht 62.0 in | Wt 162.0 lb

## 2019-09-19 DIAGNOSIS — E785 Hyperlipidemia, unspecified: Secondary | ICD-10-CM | POA: Diagnosis not present

## 2019-09-19 DIAGNOSIS — I10 Essential (primary) hypertension: Secondary | ICD-10-CM

## 2019-09-19 DIAGNOSIS — Z1211 Encounter for screening for malignant neoplasm of colon: Secondary | ICD-10-CM | POA: Diagnosis not present

## 2019-09-19 DIAGNOSIS — E118 Type 2 diabetes mellitus with unspecified complications: Secondary | ICD-10-CM

## 2019-09-19 NOTE — Progress Notes (Signed)
Subjective:    Patient ID: Stephanie [name], female    DOB: 07-22-1966, 54 y.o.   MRN: [id number]  HPI  Patient is coming in today to repeat lab work to monitor her diet 80s.  When last seen in October, her hemoglobin A1c was 6.7.  Since I last saw the patient, she has tried to change her diet.  She has lost approximately 10 pounds!.  She denies any polyuria, polydipsia, or blurry vision.  She denies any chest pain or shortness of breath or dyspnea on exertion.  She is not routinely checking her sugars.  She does not know what that ideal sugar value should be at certain times of the day.  Also she has questions regarding what type of diet she should be eating.  She denies any myalgias or right upper quadrant pain on her Crestor.  She is overdue for a Pap smear and a mammogram however she tells me that she is scheduled this with her gynecologist for February.  She is also long overdue for colonoscopy and she now agrees to have the colonoscopy scheduled as her sister-in-law was unfortunately just recently diagnosed with colon cancer.  Otherwise she is doing well and has completely recovered from Covid except for the loss of her sense of smell. Past Medical History:  Diagnosis Date  . Diabetes mellitus type 2 in obese ([place])   . High cholesterol   . Hyperlipidemia   . Hypertension    Past Surgical History:  Procedure Laterality Date  . CESAREAN SECTION    . CHOLECYSTECTOMY    . NASAL SINUS SURGERY  2017   Dr.Byers [facility]   Current Outpatient Medications on File Prior to Visit  Medication Sig Dispense Refill  . Estradiol (DIVIGEL) 0.5 MG/0.5GM GEL Place onto the skin.    Marland Kitchen lisinopril-hydrochlorothiazide (ZESTORETIC) 20-12.5 MG tablet TAKE 1 TABLET BY MOUTH ONCE A DAY 90 tablet 3  . metFORMIN (GLUCOPHAGE) 500 MG tablet TAKE 1 TABLET BY MOUTH TWICE (2) DAILY WITH A MEAL 180 tablet 1  . montelukast (SINGULAIR) 10 MG tablet TAKE 1 TABLET BY MOUTH EVERY NIGHT AT BEDTIME 30 tablet 3  . pantoprazole  (PROTONIX) 20 MG tablet Take 2 tablets (40 mg total) by mouth daily for 14 days. 28 tablet 0  . progesterone (PROMETRIUM) 100 MG capsule Take 100 mg by mouth daily.    . rosuvastatin (CRESTOR) 20 MG tablet TAKE 1 TABLET BY MOUTH ONCE DAILY 90 tablet 3   No current facility-administered medications on file prior to visit.    No Known Allergies Social History   Socioeconomic History  . Marital status: Married    Spouse name: Not on file  . Number of children: Not on file  . Years of education: Not on file  . Highest education level: Not on file  Occupational History  . Not on file  Tobacco Use  . Smoking status: Never Smoker  . Smokeless tobacco: Never Used  Substance and Sexual Activity  . Alcohol use: No  . Drug use: Never  . Sexual activity: Yes    Comment: divorced  Other Topics Concern  . Not on file  Social History Narrative   ** Merged History Encounter **       Social Determinants of Health   Financial Resource Strain:   . Difficulty of Paying Living Expenses: Not on file  Food Insecurity:   . Worried About Charity fundraiser in the Last Year: Not on file  . Ran Out of  Food in the Last Year: Not on file  Transportation Needs:   . Lack of Transportation (Medical): Not on file  . Lack of Transportation (Non-Medical): Not on file  Physical Activity:   . Days of Exercise per Week: Not on file  . Minutes of Exercise per Session: Not on file  Stress:   . Feeling of Stress : Not on file  Social Connections:   . Frequency of Communication with Friends and Family: Not on file  . Frequency of Social Gatherings with Friends and Family: Not on file  . Attends Religious Services: Not on file  . Active Member of Clubs or Organizations: Not on file  . Attends Archivist Meetings: Not on file  . Marital Status: Not on file  Intimate Partner Violence:   . Fear of Current or Ex-Partner: Not on file  . Emotionally Abused: Not on file  . Physically Abused: Not on  file  . Sexually Abused: Not on file     Review of Systems  All other systems reviewed and are negative.      Objective:   Physical Exam Vitals reviewed.  Constitutional:      General: She is not in acute distress.    Appearance: She is well-developed. She is not diaphoretic.  Cardiovascular:     Rate and Rhythm: Normal rate and regular rhythm.     Pulses: Normal pulses.     Heart sounds: Normal heart sounds. No murmur.  Pulmonary:     Effort: Pulmonary effort is normal. No respiratory distress.     Breath sounds: Normal breath sounds. No stridor. No wheezing, rhonchi or rales.  Abdominal:     General: Abdomen is flat. Bowel sounds are normal.     Palpations: Abdomen is soft.  Neurological:     General: No focal deficit present.     Mental Status: She is oriented to person, place, and time.     Cranial Nerves: No cranial nerve deficit.           Assessment & Plan:  Essential hypertension  Controlled type 2 diabetes mellitus with complication, without long-term current use of insulin ([place]) - Plan: CBC with Differential/Platelet, COMPLETE METABOLIC PANEL WITH GFR, Lipid panel, Hemoglobin A1c, Microalbumin, urine  Hyperlipidemia, unspecified hyperlipidemia type  Colon cancer screening - Plan: Ambulatory referral to Gastroenterology  Diabetic foot exam was performed today and was normal.  Blood pressure is outstanding 128/72.  I will make no changes in her lisinopril/HCTZ.  I will check a CMP to monitor her renal function and potassium on this medication.  I will also check a hemoglobin A1c.  Ideally I like her hemoglobin A1c to be less than 6.5.  If it is I will make no changes in her Metformin.  Check a fasting lipid panel.  Ideally I like her LDL cholesterol to be well below 100.  Also monitor her liver function test on this.  Recommended a colonoscopy and I will arrange to have that scheduled for her.

## 2019-09-20 ENCOUNTER — Encounter: Payer: Self-pay | Admitting: Family Medicine

## 2019-09-20 LAB — COMPLETE METABOLIC PANEL WITH GFR
AG Ratio: 1.5 (calc) (ref 1.0–2.5)
ALT: 25 U/L (ref 6–29)
AST: 25 U/L (ref 10–35)
Albumin: 4.6 g/dL (ref 3.6–5.1)
Alkaline phosphatase (APISO): 58 U/L (ref 37–153)
BUN: 10 mg/dL (ref 7–25)
CO2: 26 mmol/L (ref 20–32)
Calcium: 9.4 mg/dL (ref 8.6–10.4)
Chloride: 100 mmol/L (ref 98–110)
Creat: 0.64 mg/dL (ref 0.50–1.05)
GFR, Est African American: 118 mL/min/{1.73_m2} (ref >=60)
GFR, Est Non African American: 102 mL/min/{1.73_m2} (ref >=60)
Globulin: 3 g/dL (calc) (ref 1.9–3.7)
Glucose, Bld: 109 mg/dL — ABNORMAL HIGH (ref 65–99)
Potassium: 4.2 mmol/L (ref 3.5–5.3)
Sodium: 137 mmol/L (ref 135–146)
Total Bilirubin: 0.4 mg/dL (ref 0.2–1.2)
Total Protein: 7.6 g/dL (ref 6.1–8.1)

## 2019-09-20 LAB — LIPID PANEL
Cholesterol: 156 mg/dL (ref <200)
HDL: 44 mg/dL — ABNORMAL LOW (ref >=50)
LDL Cholesterol (Calc): 92 mg/dL (calc)
Non-HDL Cholesterol (Calc): 112 mg/dL (calc) (ref <130)
Total CHOL/HDL Ratio: 3.5 (calc) (ref <5.0)
Triglycerides: 100 mg/dL (ref <150)

## 2019-09-20 LAB — CBC WITH DIFFERENTIAL/PLATELET
Absolute Monocytes: 320 cells/uL (ref 200–950)
Basophils Absolute: 41 cells/uL (ref 0–200)
Basophils Relative: 0.5 %
Eosinophils Absolute: 238 cells/uL (ref 15–500)
Eosinophils Relative: 2.9 %
HCT: 38.4 % (ref 35.0–45.0)
Hemoglobin: 12.8 g/dL (ref 11.7–15.5)
Lymphs Abs: 3182 cells/uL (ref 850–3900)
MCH: 31.5 pg (ref 27.0–33.0)
MCHC: 33.3 g/dL (ref 32.0–36.0)
MCV: 94.6 fL (ref 80.0–100.0)
MPV: 10.7 fL (ref 7.5–12.5)
Monocytes Relative: 3.9 %
Neutro Abs: 4420 cells/uL (ref 1500–7800)
Neutrophils Relative %: 53.9 %
Platelets: 258 10*3/uL (ref 140–400)
RBC: 4.06 10*6/uL (ref 3.80–5.10)
RDW: 12.5 % (ref 11.0–15.0)
Total Lymphocyte: 38.8 %
WBC: 8.2 10*3/uL (ref 3.8–10.8)

## 2019-09-20 LAB — MICROALBUMIN, URINE: Microalb, Ur: 0.5 mg/dL

## 2019-09-20 LAB — HEMOGLOBIN A1C
Hgb A1c MFr Bld: 5.9 % of total Hgb — ABNORMAL HIGH (ref <5.7)
Mean Plasma Glucose: 123 (calc)
eAG (mmol/L): 6.8 (calc)

## 2019-09-24 ENCOUNTER — Other Ambulatory Visit: Payer: Self-pay

## 2019-09-24 ENCOUNTER — Telehealth: Payer: Self-pay

## 2019-09-24 DIAGNOSIS — Z1211 Encounter for screening for malignant neoplasm of colon: Secondary | ICD-10-CM

## 2019-09-24 MED ORDER — NA SULFATE-K SULFATE-MG SULF 17.5-3.13-1.6 GM/177ML PO SOLN: 354.0000 mL | Freq: Once | ORAL | 0 refills | Status: AC

## 2019-09-24 NOTE — Telephone Encounter (Signed)
Gastroenterology Pre-Procedure Review   PATIENT REVIEW QUESTIONS: The patient responded to the following health history questions as indicated:    1. Are you having any GI issues? no 2. Do you have a personal history of Polyps? no 3. Do you have a family history of Colon Cancer or Polyps? Brother had polyps  4. Diabetes Mellitus? Yes  5. Joint replacements in the past 12 months?no 6. Major health problems in the past 3 months?no 7. Any artificial heart valves, MVP, or defibrillator?no    MEDICATIONS & ALLERGIES:    Patient reports the following regarding taking any anticoagulation/antiplatelet therapy:   Plavix, Coumadin, Eliquis, Xarelto, Lovenox, Pradaxa, Brilinta, or Effient? no Aspirin? No   Patient confirms/reports the following medications:  Current Outpatient Medications  Medication Sig Dispense Refill  . Estradiol (DIVIGEL) 0.5 MG/0.5GM GEL Place onto the skin.    Marland Kitchen lisinopril-hydrochlorothiazide (ZESTORETIC) 20-12.5 MG tablet TAKE 1 TABLET BY MOUTH ONCE A DAY 90 tablet 3  . metFORMIN (GLUCOPHAGE) 500 MG tablet TAKE 1 TABLET BY MOUTH TWICE (2) DAILY WITH A MEAL 180 tablet 1  . montelukast (SINGULAIR) 10 MG tablet TAKE 1 TABLET BY MOUTH EVERY NIGHT AT BEDTIME 30 tablet 3  . pantoprazole (PROTONIX) 20 MG tablet Take 2 tablets (40 mg total) by mouth daily for 14 days. (Patient taking differently: Take 20 mg by mouth 2 (two) times daily. ) 28 tablet 0  . progesterone (PROMETRIUM) 100 MG capsule Take 100 mg by mouth daily.    . rosuvastatin (CRESTOR) 20 MG tablet TAKE 1 TABLET BY MOUTH ONCE DAILY 90 tablet 3   No current facility-administered medications for this visit.    Patient confirms/reports the following allergies:  No Known Allergies  No orders of the defined types were placed in this encounter.   AUTHORIZATION INFORMATION Primary Insurance: 1D#: Group #:  Secondary Insurance: 1D#: Group #:  SCHEDULE INFORMATION: Date: 10/21/2019 Time: Location: [place]

## 2019-09-25 ENCOUNTER — Telehealth: Payer: Self-pay | Admitting: Gastroenterology

## 2019-09-25 ENCOUNTER — Telehealth: Payer: Self-pay

## 2019-09-25 NOTE — Telephone Encounter (Signed)
Patient has been advised that she can have her COVID test as early as 8am on the 25th.  Thanks,  [place], [place]

## 2019-09-25 NOTE — Telephone Encounter (Signed)
Patient called & wanted to check to see if she can have her covid test done around 8:30 instead of 10:30.

## 2019-09-25 NOTE — Telephone Encounter (Signed)
Patient has been informed that she can have her COVID test at 8am on 11/14/19 due to work schedule.  Thanks [place], [place]

## 2019-10-09 ENCOUNTER — Telehealth: Payer: Self-pay | Admitting: Gastroenterology

## 2019-10-09 NOTE — Telephone Encounter (Signed)
Patient called & l/m on v/m stating she had a dental emergency & was put on amoxicillin can she still have her colonoscopy on 10-21-2019

## 2019-10-09 NOTE — Telephone Encounter (Signed)
Returned patients call.  LVM to let her know that she can still have her colonoscopy as schedule on 3/1, even though she has been prescribed Amoxicillin for dental care.  Thanks,  [place], [place]

## 2019-10-14 ENCOUNTER — Telehealth: Payer: Self-pay | Admitting: Gastroenterology

## 2019-10-14 DIAGNOSIS — Z1151 Encounter for screening for human papillomavirus (HPV): Secondary | ICD-10-CM | POA: Diagnosis not present

## 2019-10-14 DIAGNOSIS — Z1231 Encounter for screening mammogram for malignant neoplasm of breast: Secondary | ICD-10-CM | POA: Diagnosis not present

## 2019-10-14 DIAGNOSIS — Z124 Encounter for screening for malignant neoplasm of cervix: Secondary | ICD-10-CM | POA: Diagnosis not present

## 2019-10-14 DIAGNOSIS — Z01419 Encounter for gynecological examination (general) (routine) without abnormal findings: Secondary | ICD-10-CM | POA: Diagnosis not present

## 2019-10-14 DIAGNOSIS — Z6830 Body mass index (BMI) 30.0-30.9, adult: Secondary | ICD-10-CM | POA: Diagnosis not present

## 2019-10-14 NOTE — Telephone Encounter (Signed)
Patient called & l/m v/m asking if it was ok to have the covid test done on Thursday @ 8:30 am here at [facility]. She has a colonoscopy scheduled for 3-1-20201. Please call [phone].

## 2019-10-14 NOTE — Telephone Encounter (Signed)
Patients call has been returned.  LVM for her to let her know she can go for her COVID test on 10/17/19 at 8:30am for Monday March 1st Colonoscopy.  Caryl Pina in Pre-Admit testing has been made aware of patients time requested to come.  Thanks,  [place], [place]

## 2019-10-14 NOTE — Anesthesia Preprocedure Evaluation (Deleted)
Anesthesia Evaluation    Airway        Dental   Pulmonary   COVID+ 08/19/19          Cardiovascular hypertension,    HLD   Neuro/Psych    GI/Hepatic GERD  , Esophageal stricture   Endo/Other  diabetes, Type 2  Renal/GU      Musculoskeletal  (+) Arthritis , Osteoarthritis,    Abdominal   Peds  Hematology   Anesthesia Other Findings   Reproductive/Obstetrics                             Anesthesia Physical Anesthesia Plan  ASA: II  Anesthesia Plan: General   Post-op Pain Management:    Induction: Intravenous  PONV Risk Score and Plan: 3 and Propofol infusion, TIVA and Treatment may vary due to age or medical condition  Airway Management Planned: Natural Airway and Nasal Cannula  Additional Equipment:   Intra-op Plan:   Post-operative Plan:   Informed Consent: I have reviewed the patients History and Physical, chart, labs and discussed the procedure including the risks, benefits and alternatives for the proposed anesthesia with the patient or authorized representative who has indicated his/her understanding and acceptance.       Plan Discussed with: CRNA and Anesthesiologist  Anesthesia Plan Comments:         Anesthesia Quick Evaluation

## 2019-10-15 ENCOUNTER — Encounter: Payer: Self-pay | Admitting: Gastroenterology

## 2019-10-15 ENCOUNTER — Other Ambulatory Visit: Payer: Self-pay

## 2019-10-17 ENCOUNTER — Other Ambulatory Visit: Payer: BC Managed Care – PPO | Attending: Gastroenterology

## 2019-10-18 ENCOUNTER — Encounter: Payer: Self-pay | Admitting: Gastroenterology

## 2019-10-21 ENCOUNTER — Ambulatory Visit: Payer: Self-pay | Admitting: Anesthesiology

## 2019-10-21 ENCOUNTER — Encounter
Admission: RE | Disposition: A | Discharge status: Home / Self Care | Payer: Self-pay | Source: Ambulatory Visit | Attending: Gastroenterology

## 2019-10-21 ENCOUNTER — Ambulatory Visit
Admission: RE | Admit: 2019-10-21 | Discharge: 2019-10-21 | Disposition: A | Discharge status: Home / Self Care | Payer: BC Managed Care – PPO | Source: Ambulatory Visit | Attending: Gastroenterology | Admitting: Gastroenterology

## 2019-10-21 ENCOUNTER — Encounter: Payer: Self-pay | Admitting: Anesthesiology

## 2019-10-21 DIAGNOSIS — Z8371 Family history of colonic polyps: Secondary | ICD-10-CM

## 2019-10-21 DIAGNOSIS — Z1211 Encounter for screening for malignant neoplasm of colon: Secondary | ICD-10-CM | POA: Diagnosis not present

## 2019-10-21 DIAGNOSIS — Z8616 Personal history of COVID-19: Secondary | ICD-10-CM | POA: Diagnosis not present

## 2019-10-21 DIAGNOSIS — D124 Benign neoplasm of descending colon: Secondary | ICD-10-CM | POA: Diagnosis not present

## 2019-10-21 DIAGNOSIS — K635 Polyp of colon: Secondary | ICD-10-CM | POA: Diagnosis not present

## 2019-10-21 DIAGNOSIS — I10 Essential (primary) hypertension: Secondary | ICD-10-CM | POA: Insufficient documentation

## 2019-10-21 DIAGNOSIS — E7800 Pure hypercholesterolemia, unspecified: Secondary | ICD-10-CM | POA: Diagnosis not present

## 2019-10-21 DIAGNOSIS — K219 Gastro-esophageal reflux disease without esophagitis: Secondary | ICD-10-CM | POA: Diagnosis not present

## 2019-10-21 DIAGNOSIS — Z7984 Long term (current) use of oral hypoglycemic drugs: Secondary | ICD-10-CM | POA: Insufficient documentation

## 2019-10-21 DIAGNOSIS — Z79899 Other long term (current) drug therapy: Secondary | ICD-10-CM | POA: Diagnosis not present

## 2019-10-21 DIAGNOSIS — E669 Obesity, unspecified: Secondary | ICD-10-CM | POA: Diagnosis not present

## 2019-10-21 DIAGNOSIS — E785 Hyperlipidemia, unspecified: Secondary | ICD-10-CM | POA: Insufficient documentation

## 2019-10-21 DIAGNOSIS — D126 Benign neoplasm of colon, unspecified: Secondary | ICD-10-CM

## 2019-10-21 DIAGNOSIS — E119 Type 2 diabetes mellitus without complications: Secondary | ICD-10-CM | POA: Insufficient documentation

## 2019-10-21 DIAGNOSIS — D120 Benign neoplasm of cecum: Secondary | ICD-10-CM | POA: Insufficient documentation

## 2019-10-21 HISTORY — DX: Presence of spectacles and contact lenses: Z97.3

## 2019-10-21 HISTORY — DX: Gastro-esophageal reflux disease without esophagitis: K21.9

## 2019-10-21 HISTORY — DX: Family history of other specified conditions: Z84.89

## 2019-10-21 HISTORY — PX: COLONOSCOPY WITH PROPOFOL: SHX5780

## 2019-10-21 LAB — GLUCOSE, CAPILLARY: Glucose-Capillary: 118 mg/dL — ABNORMAL HIGH (ref 70–99)

## 2019-10-21 SURGERY — COLONOSCOPY WITH PROPOFOL
Anesthesia: General

## 2019-10-21 MED ORDER — PROPOFOL 10 MG/ML IV BOLUS
INTRAVENOUS | Status: DC | PRN
  Administered 2019-10-21: 10 mg via INTRAVENOUS
  Administered 2019-10-21: 70 mg via INTRAVENOUS
  Administered 2019-10-21: 20 mg via INTRAVENOUS

## 2019-10-21 MED ORDER — LIDOCAINE HCL (CARDIAC) PF 100 MG/5ML IV SOSY
PREFILLED_SYRINGE | INTRAVENOUS | Status: DC | PRN
  Administered 2019-10-21: 80 mg via INTRAVENOUS

## 2019-10-21 MED ORDER — PHENYLEPHRINE HCL (PRESSORS) 10 MG/ML IV SOLN
INTRAVENOUS | Status: DC | PRN
  Administered 2019-10-21: 100 ug via INTRAVENOUS
  Administered 2019-10-21 (×2): 200 ug via INTRAVENOUS
  Administered 2019-10-21: 100 ug via INTRAVENOUS

## 2019-10-21 MED ORDER — PROPOFOL 500 MG/50ML IV EMUL
INTRAVENOUS | Status: DC | PRN
  Administered 2019-10-21: 120 ug/kg/min via INTRAVENOUS

## 2019-10-21 MED ORDER — EPHEDRINE SULFATE 50 MG/ML IJ SOLN
INTRAMUSCULAR | Status: DC | PRN
  Administered 2019-10-21 (×2): 5 mg via INTRAVENOUS

## 2019-10-21 MED ORDER — SODIUM CHLORIDE 0.9 % IV SOLN
INTRAVENOUS | Status: DC
  Administered 2019-10-21: 1000 mL via INTRAVENOUS

## 2019-10-21 NOTE — Anesthesia Preprocedure Evaluation (Addendum)
Anesthesia Evaluation  Patient identified by MRN, date of birth, ID band Patient awake    Reviewed: Allergy & Precautions, H&P , NPO status , Patient's Chart, lab work & pertinent test results  History of Anesthesia Complications (+) Family history of anesthesia reaction and history of anesthetic complications (son had malignant hyperthermia)  Airway Mallampati: II  TM Distance: >3 FB Neck ROM: full    Dental  (+) Teeth Intact   Pulmonary neg pulmonary ROS, neg COPD,           Cardiovascular hypertension, (-) angina(-) Past MI (-) dysrhythmias      Neuro/Psych negative neurological ROS  negative psych ROS   GI/Hepatic Neg liver ROS, GERD  ,Esophageal stricture   Endo/Other  diabetes  Renal/GU negative Renal ROS  negative genitourinary   Musculoskeletal   Abdominal   Peds  Hematology negative hematology ROS (+)   Anesthesia Other Findings Past Medical History: 08/19/2019: COVID-19     Comment:  Symptoms resolved 08/22/19 No date: Diabetes mellitus type 2 in obese ([facility]) No date: Family history of adverse reaction to anesthesia     Comment:  son - Malignant Hyperthermia  No date: GERD (gastroesophageal reflux disease) No date: High cholesterol No date: Hyperlipidemia No date: Hypertension No date: Wears contact lenses  Past Surgical History: No date: CESAREAN SECTION No date: CHOLECYSTECTOMY 2017: NASAL SINUS SURGERY     Comment:  Dr.Byers [facility]  BMI    Body Mass Index: 29.48 kg/m      Reproductive/Obstetrics negative OB ROS                            Anesthesia Physical Anesthesia Plan  ASA: II  Anesthesia Plan: General   Post-op Pain Management:    Induction:   PONV Risk Score and Plan: Propofol infusion and TIVA  Airway Management Planned: Natural Airway and Nasal Cannula  Additional Equipment:   Intra-op Plan:   Post-operative Plan:   Informed Consent: I  have reviewed the patients History and Physical, chart, labs and discussed the procedure including the risks, benefits and alternatives for the proposed anesthesia with the patient or authorized representative who has indicated his/her understanding and acceptance.     Dental Advisory Given  Plan Discussed with: Anesthesiologist  Anesthesia Plan Comments:         Anesthesia Quick Evaluation

## 2019-10-21 NOTE — H&P (Signed)
Stephanie Bellows, MD [address], [place], [place], [place], [address] [address], [place], [place], [place], [address] Phone: [phone]  Fax: [phone]  Primary Care Physician:  Stephanie Frizzle, MD   Pre-Procedure History & Physical: HPI:  Stephanie [name] is a 54 y.o. female is here for an colonoscopy.   Past Medical History:  Diagnosis Date  . COVID-19 08/19/2019   Symptoms resolved 08/22/19  . Diabetes mellitus type 2 in obese ([place])   . Family history of adverse reaction to anesthesia    son - Stephanie [name]   . GERD (gastroesophageal reflux disease)   . High cholesterol   . Hyperlipidemia   . Hypertension   . Wears contact lenses     Past Surgical History:  Procedure Laterality Date  . CESAREAN SECTION    . CHOLECYSTECTOMY    . NASAL SINUS SURGERY  2017   Dr.[facility]    Prior to Admission medications   Medication Sig Start Date End Date Taking? Authorizing Provider  lisinopril-hydrochlorothiazide (ZESTORETIC) 20-12.5 MG tablet TAKE 1 TABLET BY MOUTH ONCE A DAY 05/06/19  Yes Stephanie Frizzle, MD  metFORMIN (GLUCOPHAGE) 500 MG tablet TAKE 1 TABLET BY MOUTH TWICE (2) DAILY WITH A MEAL 08/26/19  Yes Stephanie Frizzle, MD  montelukast (SINGULAIR) 10 MG tablet TAKE 1 TABLET BY MOUTH EVERY NIGHT AT BEDTIME 09/03/19  Yes Stephanie Frizzle, MD  pantoprazole (PROTONIX) 20 MG tablet Take 2 tablets (40 mg total) by mouth daily for 14 days. Patient taking differently: Take 20 mg by mouth 2 (two) times daily.  01/16/19 10/15/19 Yes Stephanie Morgan, MD  Probiotic Product (PROBIOTIC DAILY PO) Take by mouth daily.   Yes [provider]  progesterone (PROMETRIUM) 100 MG capsule Take 100 mg by mouth daily.   Yes [provider]  rosuvastatin (CRESTOR) 20 MG tablet TAKE 1 TABLET BY MOUTH ONCE DAILY 05/06/19  Yes Stephanie Frizzle, MD    Allergies as of 09/24/2019  . (No Known Allergies)    Family History  Problem Relation Age of Onset  .  Hypertension Mother   . Hyperlipidemia Mother   . Heart disease Father   . Hyperlipidemia Brother   . Hypertension Brother   . Cancer Maternal Grandmother        brain tumor  . Cancer Maternal Grandfather        retinoblastoma    Social History   Socioeconomic History  . Marital status: Married    Spouse name: Not on file  . Number of children: Not on file  . Years of education: Not on file  . Highest education level: Not on file  Occupational History  . Not on file  Tobacco Use  . Smoking status: Never Smoker  . Smokeless tobacco: Never Used  Substance and Sexual Activity  . Alcohol use: No  . Drug use: Never  . Sexual activity: Yes    Comment: divorced  Other Topics Concern  . Not on file  Social History Narrative   ** Merged History Encounter **       Social Determinants of Health   Financial Resource Strain:   . Difficulty of Paying Living Expenses: Not on file  Food Insecurity:   . Worried About Charity fundraiser in the Last Year: Not on file  . Ran Out of Food in the Last Year: Not on file  Transportation Needs:   . Lack of Transportation (Medical): Not on file  .  Lack of Transportation (Non-Medical): Not on file  Physical Activity:   . Days of Exercise per Week: Not on file  . Minutes of Exercise per Session: Not on file  Stress:   . Feeling of Stress : Not on file  Social Connections:   . Frequency of Communication with Friends and Family: Not on file  . Frequency of Social Gatherings with Friends and Family: Not on file  . Attends Religious Services: Not on file  . Active Member of Clubs or Organizations: Not on file  . Attends Archivist Meetings: Not on file  . Marital Status: Not on file  Intimate Partner Violence:   . Fear of Current or Ex-Partner: Not on file  . Emotionally Abused: Not on file  . Physically Abused: Not on file  . Sexually Abused: Not on file    Review of Systems: See HPI, otherwise negative ROS  Physical  Exam: BP 106/66   Pulse 77   Temp 97.9 F (36.6 C) (Temporal)   Resp 20   Ht 5\' 1"  (1.549 m)   Wt 70.8 kg   SpO2 97%   BMI 29.48 kg/m  General:   Alert,  pleasant and cooperative in NAD Head:  Normocephalic and atraumatic. Neck:  Supple; no masses or thyromegaly. Lungs:  Clear throughout to auscultation, normal respiratory effort.    Heart:  +S1, +S2, Regular rate and rhythm, No edema. Abdomen:  Soft, nontender and nondistended. Normal bowel sounds, without guarding, and without rebound.   Neurologic:  Alert and  oriented x4;  grossly normal neurologically.  Impression/Plan: [name] is here for an colonoscopy to be performed for screening : brother had colon polyps and has repeat procedure in 5 years. Risks, benefits, limitations, and alternatives regarding  colonoscopy have been reviewed with the patient.  Questions have been answered.  All parties agreeable.   Stephanie Bellows, MD  10/21/2019, 9:37 AM

## 2019-10-21 NOTE — Anesthesia Postprocedure Evaluation (Signed)
Anesthesia Post Note  Patient: Stephanie [name]  Procedure(s) Performed: COLONOSCOPY WITH PROPOFOL (N/A )  Patient location during evaluation: PACU Anesthesia Type: General Level of consciousness: awake and alert Pain management: pain level controlled Vital Signs Assessment: post-procedure vital signs reviewed and stable Respiratory status: spontaneous breathing, nonlabored ventilation and respiratory function stable Cardiovascular status: blood pressure returned to baseline and stable Postop Assessment: no apparent nausea or vomiting Anesthetic complications: no     Last Vitals:  Vitals:   10/21/19 1030 10/21/19 1040  BP: (!) 105/56 109/60  Pulse: 78 70  Resp: 16 14  Temp:    SpO2: 100% 100%    Last Pain:  Vitals:   10/21/19 1010  TempSrc: Temporal  PainSc:                  [name]

## 2019-10-21 NOTE — Transfer of Care (Signed)
Immediate Anesthesia Transfer of Care Note  Patient: Stephanie [name]  Procedure(s) Performed: COLONOSCOPY WITH PROPOFOL (N/A )  Patient Location: Endoscopy Unit  Anesthesia Type:General  Level of Consciousness: drowsy  Airway & Oxygen Therapy: Patient Spontanous Breathing  Post-op Assessment: Report given to RN and Post -op Vital signs reviewed and stable  Post vital signs: Reviewed and stable  Last Vitals:  Vitals Value Taken Time  BP 90/51 10/21/19 1017  Temp    Pulse 84 10/21/19 1019  Resp 19 10/21/19 1019  SpO2 97 % 10/21/19 1019  Vitals shown include unvalidated device data.  Last Pain:  Vitals:   10/21/19 1010  TempSrc: Temporal  PainSc:          Complications: No apparent anesthesia complications

## 2019-10-21 NOTE — Op Note (Signed)
[facility] Gastroenterology Patient Name: Stephanie [name] Procedure Date: 10/21/2019 9:26 AM MRN: [id number] Account #: [id number] Date of Birth: 01-13-1966 Admit Type: Inpatient Age: 54 Room: [facility] ENDO ROOM 1 Gender: Female Note Status: Finalized Procedure:             Colonoscopy Indications:           Colon cancer screening in patient at increased risk:                         Family history of 1st-degree relative with colon polyps Providers:             Jonathon Bellows MD, MD Medicines:             Monitored Anesthesia Care Complications:         No immediate complications. Procedure:             Pre-Anesthesia Assessment:                        - Prior to the procedure, a History and Physical was                         performed, and patient medications, allergies and                         sensitivities were reviewed. The patient's tolerance                         of previous anesthesia was reviewed.                        - The risks and benefits of the procedure and the                         sedation options and risks were discussed with the                         patient. All questions were answered and informed                         consent was obtained.                        - ASA Grade Assessment: II - A patient with mild                         systemic disease.                        After obtaining informed consent, the colonoscope was                         passed under direct vision. Throughout the procedure,                         the patient's blood pressure, pulse, and oxygen                         saturations were monitored continuously. The  Colonoscope was introduced through the anus and                         advanced to the the cecum, identified by the                         appendiceal orifice. The colonoscopy was performed                         with ease. The patient tolerated the procedure well.                          The quality of the bowel preparation was excellent. Findings:      The perianal and digital rectal examinations were normal.      Two sessile polyps were found in the descending colon and cecum. The       polyps were 5 to 7 mm in size. These polyps were removed with a cold       snare. Resection and retrieval were complete.      The exam was otherwise without abnormality on direct and retroflexion       views. Impression:            - Two 5 to 7 mm polyps in the descending colon and in                         the cecum, removed with a cold snare. Resected and                         retrieved.                        - The examination was otherwise normal on direct and                         retroflexion views. Recommendation:        - Discharge patient to home (with escort).                        - Resume previous diet.                        - Continue present medications.                        - Await pathology results.                        - Repeat colonoscopy for surveillance based on                         pathology results. Procedure Code(s):     --- Professional ---                        [id number], Colonoscopy, flexible; with removal of                         tumor(s), polyp(s), or other lesion(s) by snare  technique Diagnosis Code(s):     --- Professional ---                        Z83.71, Family history of colonic polyps                        K63.5, Polyp of colon CPT copyright 2019 American Medical Association. All rights reserved. The codes documented in this report are preliminary and upon coder review may  be revised to meet current compliance requirements. Jonathon Bellows, MD Jonathon Bellows MD, MD 10/21/2019 10:11:38 AM This report has been signed electronically. Number of Addenda: 0 Note Initiated On: 10/21/2019 9:26 AM Scope Withdrawal Time: 0 hours 12 minutes 16 seconds  Total Procedure Duration: 0 hours 16 minutes 26 seconds   Estimated Blood Loss:  Estimated blood loss: none.      [facility]

## 2019-10-22 ENCOUNTER — Encounter: Payer: Self-pay | Admitting: *Deleted

## 2019-10-22 LAB — SURGICAL PATHOLOGY

## 2019-10-24 ENCOUNTER — Encounter: Payer: Self-pay | Admitting: Family Medicine

## 2019-10-24 DIAGNOSIS — D126 Benign neoplasm of colon, unspecified: Secondary | ICD-10-CM | POA: Insufficient documentation

## 2019-11-05 ENCOUNTER — Encounter: Payer: Self-pay | Admitting: Gastroenterology

## 2019-12-28 ENCOUNTER — Other Ambulatory Visit: Payer: Self-pay | Admitting: Family Medicine

## 2020-01-18 ENCOUNTER — Other Ambulatory Visit: Payer: Self-pay | Admitting: Family Medicine

## 2020-01-18 DIAGNOSIS — J0141 Acute recurrent pansinusitis: Secondary | ICD-10-CM

## 2020-03-11 ENCOUNTER — Encounter: Payer: Self-pay | Admitting: Family Medicine

## 2020-03-31 DIAGNOSIS — H9201 Otalgia, right ear: Secondary | ICD-10-CM | POA: Diagnosis not present

## 2020-04-15 DIAGNOSIS — R1013 Epigastric pain: Secondary | ICD-10-CM | POA: Diagnosis not present

## 2020-04-15 DIAGNOSIS — K219 Gastro-esophageal reflux disease without esophagitis: Secondary | ICD-10-CM | POA: Diagnosis not present

## 2020-05-08 ENCOUNTER — Ambulatory Visit: Payer: BC Managed Care – PPO | Admitting: Family Medicine

## 2020-05-08 ENCOUNTER — Other Ambulatory Visit: Payer: Self-pay

## 2020-05-08 ENCOUNTER — Ambulatory Visit (INDEPENDENT_AMBULATORY_CARE_PROVIDER_SITE_OTHER): Payer: BC Managed Care – PPO | Admitting: Family Medicine

## 2020-05-08 VITALS — BP 112/78 | HR 72 | Temp 97.6°F | Resp 18 | Wt 164.0 lb

## 2020-05-08 DIAGNOSIS — E785 Hyperlipidemia, unspecified: Secondary | ICD-10-CM

## 2020-05-08 DIAGNOSIS — E118 Type 2 diabetes mellitus with unspecified complications: Secondary | ICD-10-CM | POA: Diagnosis not present

## 2020-05-08 DIAGNOSIS — I10 Essential (primary) hypertension: Secondary | ICD-10-CM

## 2020-05-08 NOTE — Progress Notes (Signed)
Subjective:    Patient ID: Stephanie [name], female    DOB: [birth year], 54 y.o.   MRN: [id number]  HPI 09/19/19 Patient is coming in today to repeat lab work to monitor her diet 80s.  When last seen in October, her hemoglobin A1c was 6.7.  Since I last saw the patient, she has tried to change her diet.  She has lost approximately 10 pounds!.  She denies any polyuria, polydipsia, or blurry vision.  She denies any chest pain or shortness of breath or dyspnea on exertion.  She is not routinely checking her sugars.  She does not know what that ideal sugar value should be at certain times of the day.  Also she has questions regarding what type of diet she should be eating.  She denies any myalgias or right upper quadrant pain on her Crestor.  She is overdue for a Pap smear and a mammogram however she tells me that she is scheduled this with her gynecologist for February.  She is also long overdue for colonoscopy and she now agrees to have the colonoscopy scheduled as her sister-in-law was unfortunately just recently diagnosed with colon cancer.  Otherwise she is doing well and has completely recovered from Covid except for the loss of her sense of smell.  At that time, my plan was: Diabetic foot exam was performed today and was normal.  Blood pressure is outstanding 128/72.  I will make no changes in her lisinopril/HCTZ.  I will check a CMP to monitor her renal function and potassium on this medication.  I will also check a hemoglobin A1c.  Ideally I like her hemoglobin A1c to be less than 6.5.  If it is I will make no changes in her Metformin.  Check a fasting lipid panel.  Ideally I like her LDL cholesterol to be well below 100.  Also monitor her liver function test on this.  Recommended a colonoscopy and I will arrange to have that scheduled for her.  05/08/20 Wt Readings from Last 3 Encounters:  05/08/20 164 lb (74.4 kg)  10/21/19 156 lb (70.8 kg)  09/19/19 162 lb (73.5 kg)     Patient's fasting blood  sugar this morning was 159.  She is not been checking it regularly.  She denies any polyuria, polydipsia, blurry vision.  She denies any chest pain shortness of breath or dyspnea on exertion.  Blood pressure today is well controlled.  She has had COVID.  She is questioning whether she should get the Covid vaccine.  I recommended the Covid vaccine to reduce the chance of reinfection.  Otherwise she is doing well with no concerns.  She denies any neuropathy in her feet. Past Medical History:  Diagnosis Date  . COVID-19 08/19/2019   Symptoms resolved 08/22/19  . Diabetes mellitus type 2 in obese ([place])   . Family history of adverse reaction to anesthesia    son - Malignant Hyperthermia   . GERD (gastroesophageal reflux disease)   . High cholesterol   . Hyperlipidemia   . Hypertension   . Tubular adenoma of colon   . Wears contact lenses    Past Surgical History:  Procedure Laterality Date  . CESAREAN SECTION    . CHOLECYSTECTOMY    . COLONOSCOPY WITH PROPOFOL N/A 10/21/2019   Procedure: COLONOSCOPY WITH PROPOFOL;  Surgeon: Jonathon Bellows, MD;  Location: [facility] ENDOSCOPY;  Service: Endoscopy;  Laterality: N/A;  Diabetic - oral meds COVID (+) - 08/19/19  . NASAL SINUS SURGERY  2017  Dr.Byers [facility]   Current Outpatient Medications on File Prior to Visit  Medication Sig Dispense Refill  . lisinopril-hydrochlorothiazide (ZESTORETIC) 20-12.5 MG tablet TAKE 1 TABLET BY MOUTH ONCE A DAY 90 tablet 3  . metFORMIN (GLUCOPHAGE) 500 MG tablet TAKE 1 TABLET BY MOUTH TWICE (2) DAILY WITH A MEAL 180 tablet 1  . montelukast (SINGULAIR) 10 MG tablet TAKE 1 TABLET BY MOUTH EVERY NIGHT AT BEDTIME 30 tablet 5  . pantoprazole (PROTONIX) 20 MG tablet Take 2 tablets (40 mg total) by mouth daily for 14 days. (Patient taking differently: Take 20 mg by mouth 2 (two) times daily. ) 28 tablet 0  . Probiotic Product (PROBIOTIC DAILY PO) Take by mouth daily.    . progesterone (PROMETRIUM) 100 MG capsule Take 100 mg by mouth  daily.    . rosuvastatin (CRESTOR) 20 MG tablet TAKE 1 TABLET BY MOUTH ONCE DAILY 90 tablet 3   No current facility-administered medications on file prior to visit.    No Known Allergies Social History   Socioeconomic History  . Marital status: Married    Spouse name: Not on file  . Number of children: Not on file  . Years of education: Not on file  . Highest education level: Not on file  Occupational History  . Not on file  Tobacco Use  . Smoking status: Never Smoker  . Smokeless tobacco: Never Used  Vaping Use  . Vaping Use: Never used  Substance and Sexual Activity  . Alcohol use: No  . Drug use: Never  . Sexual activity: Yes    Comment: divorced  Other Topics Concern  . Not on file  Social History Narrative   ** Merged History Encounter **       Social Determinants of Health   Financial Resource Strain:   . Difficulty of Paying Living Expenses: Not on file  Food Insecurity:   . Worried About Charity fundraiser in the Last Year: Not on file  . Ran Out of Food in the Last Year: Not on file  Transportation Needs:   . Lack of Transportation (Medical): Not on file  . Lack of Transportation (Non-Medical): Not on file  Physical Activity:   . Days of Exercise per Week: Not on file  . Minutes of Exercise per Session: Not on file  Stress:   . Feeling of Stress : Not on file  Social Connections:   . Frequency of Communication with Friends and Family: Not on file  . Frequency of Social Gatherings with Friends and Family: Not on file  . Attends Religious Services: Not on file  . Active Member of Clubs or Organizations: Not on file  . Attends Archivist Meetings: Not on file  . Marital Status: Not on file  Intimate Partner Violence:   . Fear of Current or Ex-Partner: Not on file  . Emotionally Abused: Not on file  . Physically Abused: Not on file  . Sexually Abused: Not on file     Review of Systems  All other systems reviewed and are negative.       Objective:   Physical Exam Vitals reviewed.  Constitutional:      General: She is not in acute distress.    Appearance: She is well-developed. She is not diaphoretic.  Cardiovascular:     Rate and Rhythm: Normal rate and regular rhythm.     Pulses: Normal pulses.     Heart sounds: Normal heart sounds. No murmur heard.   Pulmonary:  Effort: Pulmonary effort is normal. No respiratory distress.     Breath sounds: Normal breath sounds. No stridor. No wheezing, rhonchi or rales.  Abdominal:     General: Abdomen is flat. Bowel sounds are normal.     Palpations: Abdomen is soft.  Neurological:     General: No focal deficit present.     Mental Status: She is oriented to person, place, and time.     Cranial Nerves: No cranial nerve deficit.           Assessment & Plan:  Controlled type 2 diabetes mellitus with complication, without long-term current use of insulin (HCC) - Plan: CBC with Differential/Platelet, COMPLETE METABOLIC PANEL WITH GFR, Lipid panel, Hemoglobin A1c, Microalbumin, urine  Essential hypertension  Hyperlipidemia, unspecified hyperlipidemia type  Blood pressures well controlled today.  Check CBC, CMP, fasting lipid panel, A1c.  Goal A1c is less than 7.  If greater than 7 increase Metformin to 1000 mg twice daily.  Recommended exercise 30 minutes a day 5 days a week.  Recommended the Covid vaccine.  Goal LDL cholesterol is less than 100.  Check a urine microalbumin.

## 2020-05-09 LAB — COMPLETE METABOLIC PANEL WITH GFR
AG Ratio: 1.5 (calc) (ref 1.0–2.5)
ALT: 19 U/L (ref 6–29)
AST: 19 U/L (ref 10–35)
Albumin: 4.6 g/dL (ref 3.6–5.1)
Alkaline phosphatase (APISO): 51 U/L (ref 37–153)
BUN: 12 mg/dL (ref 7–25)
CO2: 24 mmol/L (ref 20–32)
Calcium: 9.3 mg/dL (ref 8.6–10.4)
Chloride: 99 mmol/L (ref 98–110)
Creat: 0.67 mg/dL (ref 0.50–1.05)
GFR, Est African American: 115 mL/min/{1.73_m2} (ref >=60)
GFR, Est Non African American: 100 mL/min/{1.73_m2} (ref >=60)
Globulin: 3 g/dL (calc) (ref 1.9–3.7)
Glucose, Bld: 108 mg/dL — ABNORMAL HIGH (ref 65–99)
Potassium: 3.7 mmol/L (ref 3.5–5.3)
Sodium: 136 mmol/L (ref 135–146)
Total Bilirubin: 0.3 mg/dL (ref 0.2–1.2)
Total Protein: 7.6 g/dL (ref 6.1–8.1)

## 2020-05-09 LAB — CBC WITH DIFFERENTIAL/PLATELET
Absolute Monocytes: 340 cells/uL (ref 200–950)
Basophils Absolute: 32 cells/uL (ref 0–200)
Basophils Relative: 0.4 %
Eosinophils Absolute: 373 cells/uL (ref 15–500)
Eosinophils Relative: 4.6 %
HCT: 38.5 % (ref 35.0–45.0)
Hemoglobin: 12.8 g/dL (ref 11.7–15.5)
Lymphs Abs: 3507 cells/uL (ref 850–3900)
MCH: 31.3 pg (ref 27.0–33.0)
MCHC: 33.2 g/dL (ref 32.0–36.0)
MCV: 94.1 fL (ref 80.0–100.0)
MPV: 10.9 fL (ref 7.5–12.5)
Monocytes Relative: 4.2 %
Neutro Abs: 3848 cells/uL (ref 1500–7800)
Neutrophils Relative %: 47.5 %
Platelets: 288 10*3/uL (ref 140–400)
RBC: 4.09 10*6/uL (ref 3.80–5.10)
RDW: 12.5 % (ref 11.0–15.0)
Total Lymphocyte: 43.3 %
WBC: 8.1 10*3/uL (ref 3.8–10.8)

## 2020-05-09 LAB — LIPID PANEL
Cholesterol: 162 mg/dL (ref <200)
HDL: 51 mg/dL (ref >=50)
LDL Cholesterol (Calc): 92 mg/dL (calc)
Non-HDL Cholesterol (Calc): 111 mg/dL (calc) (ref <130)
Total CHOL/HDL Ratio: 3.2 (calc) (ref <5.0)
Triglycerides: 94 mg/dL (ref <150)

## 2020-05-09 LAB — HEMOGLOBIN A1C
Hgb A1c MFr Bld: 5.8 % of total Hgb — ABNORMAL HIGH (ref <5.7)
Mean Plasma Glucose: 120 (calc)
eAG (mmol/L): 6.6 (calc)

## 2020-05-09 LAB — MICROALBUMIN, URINE: Microalb, Ur: 0.5 mg/dL

## 2020-05-13 ENCOUNTER — Encounter: Payer: Self-pay | Admitting: Family Medicine

## 2020-05-23 ENCOUNTER — Other Ambulatory Visit: Payer: Self-pay | Admitting: Family Medicine

## 2020-06-24 ENCOUNTER — Other Ambulatory Visit: Payer: Self-pay

## 2020-06-24 ENCOUNTER — Ambulatory Visit (INDEPENDENT_AMBULATORY_CARE_PROVIDER_SITE_OTHER): Payer: BC Managed Care – PPO | Admitting: Family Medicine

## 2020-06-24 ENCOUNTER — Encounter: Payer: Self-pay | Admitting: Family Medicine

## 2020-06-24 ENCOUNTER — Ambulatory Visit: Payer: BC Managed Care – PPO | Admitting: Family Medicine

## 2020-06-24 VITALS — BP 116/64 | HR 80 | Temp 98.0°F | Resp 14 | Ht 62.0 in | Wt 167.0 lb

## 2020-06-24 DIAGNOSIS — R197 Diarrhea, unspecified: Secondary | ICD-10-CM

## 2020-06-24 DIAGNOSIS — R300 Dysuria: Secondary | ICD-10-CM | POA: Diagnosis not present

## 2020-06-24 DIAGNOSIS — N390 Urinary tract infection, site not specified: Secondary | ICD-10-CM | POA: Diagnosis not present

## 2020-06-24 LAB — URINALYSIS, ROUTINE W REFLEX MICROSCOPIC
Bilirubin Urine: NEGATIVE
Glucose, UA: NEGATIVE
Hgb urine dipstick: NEGATIVE
Ketones, ur: NEGATIVE
Leukocytes,Ua: NEGATIVE
Nitrite: NEGATIVE
Protein, ur: NEGATIVE
Specific Gravity, Urine: 1.015 (ref 1.001–1.03)
pH: 7 (ref 5.0–8.0)

## 2020-06-24 MED ORDER — FLUCONAZOLE 150 MG PO TABS: ORAL_TABLET | ORAL | 0 refills | Status: DC

## 2020-06-24 MED ORDER — CIPROFLOXACIN HCL 500 MG PO TABS: 500.0000 mg | ORAL_TABLET | Freq: Two times a day (BID) | ORAL | 0 refills | Status: DC

## 2020-06-24 NOTE — Patient Instructions (Signed)
F/U pending results   

## 2020-06-24 NOTE — Progress Notes (Signed)
   Subjective:    Patient ID: Stephanie [name], female    DOB: May 03, 1966, 54 y.o.   MRN: [id number]  Patient presents for Urinary Issues (urinary retention, freuency, burning after urination, lower abd pain)  With urinary pressure and pain after urinating that has been progressively worse over the past 2 weeks.  She initially started with some low back pain and lower abdominal pain when she was on vacation on October 17.  She denies any burning when she urinates but the pain is mostly afterward she feels a significant pressure in her bladder.  She is also had urinary frequency she will urinate and feels like she needs to go right back.  She has not had any diarrhea until this morning when she has had 2 episodes.  She denies any blood in the stool or the urine.  She has not had any fever no nausea vomiting.  She denies any vaginal discharge or vaginal bleeding. She has been taking tylenol, drinking cranberry juice   Review Of Systems:  GEN- denies fatigue, fever, weight loss,weakness, recent illness HEENT- denies eye drainage, change in vision, nasal discharge, CVS- denies chest pain, palpitations RESP- denies SOB, cough, wheeze ABD- denies N/V, change in stools, +abd pain GU- denies dysuria, hematuria, dribbling, incontinence MSK- + joint pain, muscle aches, injury Neuro- denies headache, dizziness, syncope, seizure activity       Objective:    BP 116/64   Pulse 80   Temp 98 F (36.7 C) (Temporal)   Resp 14   Ht 5\' 2"  (1.575 m)   Wt 167 lb (75.8 kg)   SpO2 99%   BMI 30.54 kg/m  GEN- NAD, alert and oriented x3 HEENT- PERRL, EOMI, non injected sclera, pink conjunctiva, MMM, oropharynx clear CVS- RRR, no murmur RESP-CTAB ABD-NABS,soft,ND, no CVA tenderness, TTP lower abdomen, no rebound, no guarding  MSK mild TTP mid lower back, good ROM  EXT- No edema Pulses- Radial, 2+        Assessment & Plan:      Problem List Items Addressed This Visit    None    Visit Diagnoses     Urinary tract infection without hematuria, site unspecified    -  Primary   Concern for UTI, though UA is clear, will send for culture,other DD colilitis with the diarrhea. Will start Cipro, await culture and see response to meds  If no improvement needs CT abd/pelvis Pt scheduled with GYN in Jan for PAP    Relevant Medications   fluconazole (DIFLUCAN) 150 MG tablet   Other Relevant Orders   Urinalysis, Routine w reflex microscopic (Completed)   Urine Culture   Diarrhea, unspecified type             Note: This dictation was prepared with Dragon dictation along with smaller phrase technology. Any transcriptional errors that result from this process are unintentional.

## 2020-06-25 LAB — URINE CULTURE
MICRO NUMBER:: 11154905
SPECIMEN QUALITY:: ADEQUATE

## 2020-06-27 ENCOUNTER — Encounter: Payer: Self-pay | Admitting: Family Medicine

## 2020-06-28 ENCOUNTER — Encounter: Payer: Self-pay | Admitting: Family Medicine

## 2020-06-28 DIAGNOSIS — R109 Unspecified abdominal pain: Secondary | ICD-10-CM

## 2020-06-28 DIAGNOSIS — R1030 Lower abdominal pain, unspecified: Secondary | ICD-10-CM

## 2020-06-28 DIAGNOSIS — R197 Diarrhea, unspecified: Secondary | ICD-10-CM

## 2020-06-29 ENCOUNTER — Other Ambulatory Visit: Payer: Self-pay

## 2020-06-29 ENCOUNTER — Ambulatory Visit
Admission: RE | Admit: 2020-06-29 | Discharge: 2020-06-29 | Disposition: A | Discharge status: Home / Self Care | Payer: BC Managed Care – PPO | Source: Ambulatory Visit | Attending: Family Medicine | Admitting: Family Medicine

## 2020-06-29 DIAGNOSIS — R109 Unspecified abdominal pain: Secondary | ICD-10-CM | POA: Insufficient documentation

## 2020-06-29 DIAGNOSIS — K760 Fatty (change of) liver, not elsewhere classified: Secondary | ICD-10-CM | POA: Diagnosis not present

## 2020-06-29 DIAGNOSIS — R197 Diarrhea, unspecified: Secondary | ICD-10-CM | POA: Insufficient documentation

## 2020-06-29 DIAGNOSIS — R1030 Lower abdominal pain, unspecified: Secondary | ICD-10-CM | POA: Insufficient documentation

## 2020-06-29 LAB — POCT I-STAT CREATININE: Creatinine, Ser: 0.7 mg/dL (ref 0.44–1.00)

## 2020-06-29 MED ORDER — IOHEXOL 300 MG/ML  SOLN
100.0000 mL | Freq: Once | INTRAMUSCULAR | Status: AC | PRN
  Administered 2020-06-29: 100 mL via INTRAVENOUS

## 2020-06-29 NOTE — Telephone Encounter (Signed)
I have called pt back and informed her of the CT results and she stated that the pain is still very present and it has not gotten any better since Wednsday when she came into the office.   Please advise on what is recommended. If she were to do a pelvic exam, would she have to come back here to do that or send her to a specialist of some kind?   Pt states she is in pain and would like to know what she should do today if at all possible.

## 2020-06-29 NOTE — Telephone Encounter (Signed)
-----   Message from Stephanie Rossetti, MD sent at 06/29/2020  1:00 PM EST ----- Call pt, her CT scan did not show any infection in the colon  She has some fatty liver and cholesterol buildup but otherwise it was normal If the pain is still lower abdomen, recommend she have pelvic exam

## 2020-07-08 ENCOUNTER — Ambulatory Visit: Payer: BC Managed Care – PPO | Admitting: Family Medicine

## 2020-07-31 ENCOUNTER — Ambulatory Visit (INDEPENDENT_AMBULATORY_CARE_PROVIDER_SITE_OTHER): Payer: BC Managed Care – PPO | Admitting: Family Medicine

## 2020-07-31 ENCOUNTER — Other Ambulatory Visit: Payer: Self-pay

## 2020-07-31 ENCOUNTER — Encounter: Payer: Self-pay | Admitting: Family Medicine

## 2020-07-31 VITALS — BP 126/64 | HR 82 | Temp 98.1°F | Resp 16 | Ht 62.0 in | Wt 167.0 lb

## 2020-07-31 DIAGNOSIS — R058 Other specified cough: Secondary | ICD-10-CM | POA: Diagnosis not present

## 2020-07-31 DIAGNOSIS — R590 Localized enlarged lymph nodes: Secondary | ICD-10-CM

## 2020-07-31 DIAGNOSIS — R946 Abnormal results of thyroid function studies: Secondary | ICD-10-CM | POA: Diagnosis not present

## 2020-07-31 DIAGNOSIS — Z8349 Family history of other endocrine, nutritional and metabolic diseases: Secondary | ICD-10-CM

## 2020-07-31 MED ORDER — LOSARTAN POTASSIUM 50 MG PO TABS: 50.0000 mg | ORAL_TABLET | Freq: Every day | ORAL | 0 refills | Status: DC

## 2020-07-31 NOTE — Patient Instructions (Signed)
Ultrasound to be done  We will call with lab results  Stop the lisinopril HCTZ Take losartan 50mg  once a day instead

## 2020-07-31 NOTE — Progress Notes (Signed)
   Subjective:    Patient ID: Stephanie [name], female    DOB: 11-Nov-1965, 54 y.o.   MRN: [id number]  Patient presents for Swollen Lymph Nodes (L sided swelling to Lymph nodes on neck)  Patient here with concern for a swollen lymph node or mass in the left side of her neck.  States it has been present since October she has not noted any particular growth.  She has not had any high fevers or weight loss associated.  But she does have a dry hacking cough for many months.  She is a non-smoker.  She is on lisinopril HCTZ for her blood pressure but she also has a history of dysphagia and has had esophageal stretching done couple of times. She does have a family history of thyroid disease.   Review Of Systems:  GEN- denies fatigue, fever, weight loss,weakness, recent illness HEENT- denies eye drainage, change in vision, nasal discharge, CVS- denies chest pain, palpitations RESP- denies SOB, cough, wheeze ABD- denies N/V, change in stools, abd pain GU- denies dysuria, hematuria, dribbling, incontinence MSK- denies joint pain, muscle aches, injury Neuro- denies headache, dizziness, syncope, seizure activity       Objective:    BP 126/64   Pulse 82   Temp 98.1 F (36.7 C) (Temporal)   Resp 16   Ht 5\' 2"  (1.575 m)   Wt 167 lb (75.8 kg)   SpO2 98%   BMI 30.54 kg/m  GEN- NAD, alert and oriented x3 HEENT- PERRL, EOMI, non injected sclera, pink conjunctiva, MMM, oropharynx clear, TM clear no effusion, naresl clear  Neck- Supple, no thyromegaly , mobile node left submandibular mild TTP CVS- RRR, no murmur RESP-CTAB Ext no edema         Assessment & Plan:      Problem List Items Addressed This Visit   None   Visit Diagnoses    Submandibular lymphadenopathy    -  Primary   Obtain US of neck to see if reactive vs abnormal node, thyroid does not feel overtly enlarged, but will check TFT as well    Relevant Orders   CBC with Differential/Platelet   TSH   T3, free   T4, free    Dry cough       ? related to ACEI, trial off lisinopril, given losartan. If not improved in 30 days, recommend GI f/u for his reflux   Relevant Orders   TSH   T3, free   T4, free   Family history of thyroid disease          Note: This dictation was prepared with Dragon dictation along with smaller phrase technology. Any transcriptional errors that result from this process are unintentional.

## 2020-08-01 LAB — CBC WITH DIFFERENTIAL/PLATELET
Absolute Monocytes: 546 cells/uL (ref 200–950)
Basophils Absolute: 42 cells/uL (ref 0–200)
Basophils Relative: 0.4 %
Eosinophils Absolute: 368 cells/uL (ref 15–500)
Eosinophils Relative: 3.5 %
HCT: 36.9 % (ref 35.0–45.0)
Hemoglobin: 12.5 g/dL (ref 11.7–15.5)
Lymphs Abs: 4547 cells/uL — ABNORMAL HIGH (ref 850–3900)
MCH: 30.7 pg (ref 27.0–33.0)
MCHC: 33.9 g/dL (ref 32.0–36.0)
MCV: 90.7 fL (ref 80.0–100.0)
MPV: 10.7 fL (ref 7.5–12.5)
Monocytes Relative: 5.2 %
Neutro Abs: 4998 cells/uL (ref 1500–7800)
Neutrophils Relative %: 47.6 %
Platelets: 293 10*3/uL (ref 140–400)
RBC: 4.07 10*6/uL (ref 3.80–5.10)
RDW: 13 % (ref 11.0–15.0)
Total Lymphocyte: 43.3 %
WBC: 10.5 10*3/uL (ref 3.8–10.8)

## 2020-08-01 LAB — T3, FREE: T3, Free: 3.2 pg/mL (ref 2.3–4.2)

## 2020-08-01 LAB — TSH: TSH: 1.73 mIU/L

## 2020-08-01 LAB — T4, FREE: Free T4: 1.1 ng/dL (ref 0.8–1.8)

## 2020-08-03 ENCOUNTER — Encounter: Payer: Self-pay | Admitting: Family Medicine

## 2020-08-04 ENCOUNTER — Other Ambulatory Visit: Payer: Self-pay | Admitting: *Deleted

## 2020-08-06 ENCOUNTER — Other Ambulatory Visit: Payer: Self-pay | Admitting: Family Medicine

## 2020-08-06 ENCOUNTER — Ambulatory Visit
Admission: RE | Admit: 2020-08-06 | Discharge: 2020-08-06 | Disposition: A | Discharge status: Home / Self Care | Payer: BC Managed Care – PPO | Source: Ambulatory Visit | Attending: Family Medicine | Admitting: Family Medicine

## 2020-08-06 ENCOUNTER — Other Ambulatory Visit: Payer: Self-pay

## 2020-08-06 DIAGNOSIS — J0141 Acute recurrent pansinusitis: Secondary | ICD-10-CM

## 2020-08-06 DIAGNOSIS — R590 Localized enlarged lymph nodes: Secondary | ICD-10-CM | POA: Diagnosis not present

## 2020-08-07 ENCOUNTER — Telehealth: Payer: Self-pay | Admitting: Family Medicine

## 2020-08-07 NOTE — Telephone Encounter (Signed)
Call for CT results

## 2020-08-10 ENCOUNTER — Ambulatory Visit: Payer: BC Managed Care – PPO

## 2020-08-10 ENCOUNTER — Ambulatory Visit: Payer: BC Managed Care – PPO | Admitting: Family Medicine

## 2020-08-10 ENCOUNTER — Other Ambulatory Visit: Payer: Self-pay

## 2020-08-10 VITALS — BP 118/70 | HR 84 | Temp 97.5°F | Ht 62.0 in | Wt 172.0 lb

## 2020-08-10 DIAGNOSIS — M542 Cervicalgia: Secondary | ICD-10-CM | POA: Diagnosis not present

## 2020-08-10 LAB — CBC WITH DIFFERENTIAL/PLATELET
Absolute Monocytes: 407 cells/uL (ref 200–950)
Basophils Absolute: 42 cells/uL (ref 0–200)
Basophils Relative: 0.5 %
Eosinophils Absolute: 332 cells/uL (ref 15–500)
Eosinophils Relative: 4 %
HCT: 35.2 % (ref 35.0–45.0)
Hemoglobin: 11.6 g/dL — ABNORMAL LOW (ref 11.7–15.5)
Lymphs Abs: 3528 cells/uL (ref 850–3900)
MCH: 30.4 pg (ref 27.0–33.0)
MCHC: 33 g/dL (ref 32.0–36.0)
MCV: 92.4 fL (ref 80.0–100.0)
MPV: 11.6 fL (ref 7.5–12.5)
Monocytes Relative: 4.9 %
Neutro Abs: 3992 cells/uL (ref 1500–7800)
Neutrophils Relative %: 48.1 %
Platelets: 246 10*3/uL (ref 140–400)
RBC: 3.81 10*6/uL (ref 3.80–5.10)
RDW: 13.2 % (ref 11.0–15.0)
Total Lymphocyte: 42.5 %
WBC: 8.3 10*3/uL (ref 3.8–10.8)

## 2020-08-10 NOTE — Progress Notes (Signed)
Subjective:    Patient ID: Stephanie [name], female    DOB: 1965/10/30, 54 y.o.   MRN: [id number]  HPI Patient is a very pleasant 54 year old Caucasian female who presents today with soreness on the left side of her neck.  She recently had blood work that showed a white blood cell count of 10.5.  Total lymphocytes were slightly elevated at 4600.  Due to the pain on the left side of the neck over the sternocleidomastoid, my partner did an ultrasound of the neck.  The cervical lymph nodes that were visualized were normal in appearance and size.  They range from 4 mm to 8 mm.  The patient presents today due to concerns that she has.  She is still tender to palpation in the area diagrammed.  She is also concerned because the elevated number of lymphocytes on the blood sample that there may be a serious underlying problem Past Medical History:  Diagnosis Date  . COVID-19 08/19/2019   Symptoms resolved 08/22/19  . Diabetes mellitus type 2 in obese ([place])   . Family history of adverse reaction to anesthesia    son - Malignant Hyperthermia   . GERD (gastroesophageal reflux disease)   . High cholesterol   . Hyperlipidemia   . Hypertension   . Tubular adenoma of colon   . Wears contact lenses    Past Surgical History:  Procedure Laterality Date  . CESAREAN SECTION    . CHOLECYSTECTOMY    . COLONOSCOPY WITH PROPOFOL N/A 10/21/2019   Procedure: COLONOSCOPY WITH PROPOFOL;  Surgeon: Jonathon Bellows, MD;  Location: [facility] ENDOSCOPY;  Service: Endoscopy;  Laterality: N/A;  Diabetic - oral meds COVID (+) - 08/19/19  . NASAL SINUS SURGERY  2017   Dr.Byers [facility]   Current Outpatient Medications on File Prior to Visit  Medication Sig Dispense Refill  . lisinopril-hydrochlorothiazide (ZESTORETIC) 20-12.5 MG tablet TAKE 1 TABLET BY MOUTH ONCE DAILY 90 tablet 3  . losartan (COZAAR) 50 MG tablet Take 1 tablet (50 mg total) by mouth daily. 30 tablet 0  . metFORMIN (GLUCOPHAGE) 500 MG tablet TAKE 1 TABLET BY MOUTH  TWICE (2) DAILY WITH A MEAL 180 tablet 1  . montelukast (SINGULAIR) 10 MG tablet TAKE 1 TABLET BY MOUTH EVERY NIGHT AT BEDTIME 30 tablet 5  . Probiotic Product (PROBIOTIC DAILY PO) Take by mouth daily.    . progesterone (PROMETRIUM) 100 MG capsule Take 100 mg by mouth daily.    . rosuvastatin (CRESTOR) 20 MG tablet TAKE 1 TABLET BY MOUTH ONCE DAILY 90 tablet 3  . pantoprazole (PROTONIX) 20 MG tablet Take 2 tablets (40 mg total) by mouth daily for 14 days. (Patient not taking: Reported on 08/10/2020) 28 tablet 0   No current facility-administered medications on file prior to visit.    No Known Allergies Social History   Socioeconomic History  . Marital status: Married    Spouse name: Not on file  . Number of children: Not on file  . Years of education: Not on file  . Highest education level: Not on file  Occupational History  . Not on file  Tobacco Use  . Smoking status: Never Smoker  . Smokeless tobacco: Never Used  Vaping Use  . Vaping Use: Never used  Substance and Sexual Activity  . Alcohol use: No  . Drug use: Never  . Sexual activity: Yes    Comment: divorced  Other Topics Concern  . Not on file  Social History Narrative   ** Merged  History Encounter **       Social Determinants of Health   Financial Resource Strain: Not on file  Food Insecurity: Not on file  Transportation Needs: Not on file  Physical Activity: Not on file  Stress: Not on file  Social Connections: Not on file  Intimate Partner Violence: Not on file     Review of Systems  All other systems reviewed and are negative.      Objective:   Physical Exam Vitals reviewed.  Constitutional:      General: She is not in acute distress.    Appearance: She is well-developed. She is not diaphoretic.  HENT:     Nose: Nose normal.  Neck:   Cardiovascular:     Rate and Rhythm: Normal rate and regular rhythm.     Pulses: Normal pulses.     Heart sounds: Normal heart sounds. No murmur  heard.   Pulmonary:     Effort: Pulmonary effort is normal. No respiratory distress.     Breath sounds: Normal breath sounds. No stridor. No wheezing, rhonchi or rales.  Abdominal:     General: Abdomen is flat. Bowel sounds are normal. There is no distension.     Palpations: Abdomen is soft. There is no mass.     Tenderness: There is no abdominal tenderness.  Musculoskeletal:     Cervical back: Pain with movement and muscular tenderness present.  Lymphadenopathy:     Cervical: No cervical adenopathy.  Skin:    Coloration: Skin is not jaundiced.     Findings: No erythema.  Neurological:     Cranial Nerves: No cranial nerve deficit.           Assessment & Plan:  Neck pain - Plan: CBC with Differential/Platelet  Repeat CBC today.  I tried to reassure the patient that her white count was relatively normal last time.  I also do not think that a total lymphocyte count of 4600 represents a serious underlying problem such as lymphoma.  I will track her white blood cell count as well as her lymphocyte count on blood work today.  I palpated her neck and I did not appreciate any lymphadenopathy.  Ultrasound was reassuring.  However if pain persist the next step would be to get a CT scan of the neck with contrast.  However based on her exam today I try to reassure the patient that I feel everything is normal.

## 2020-08-10 NOTE — Telephone Encounter (Signed)
Ct results was given to patient

## 2020-08-12 ENCOUNTER — Ambulatory Visit: Payer: BC Managed Care – PPO | Admitting: Family Medicine

## 2020-09-02 ENCOUNTER — Other Ambulatory Visit: Payer: Self-pay | Admitting: Family Medicine

## 2020-09-04 ENCOUNTER — Other Ambulatory Visit: Payer: Self-pay | Admitting: Family Medicine

## 2020-09-30 ENCOUNTER — Other Ambulatory Visit: Payer: Self-pay | Admitting: Family Medicine

## 2020-10-07 DIAGNOSIS — Z1231 Encounter for screening mammogram for malignant neoplasm of breast: Secondary | ICD-10-CM | POA: Diagnosis not present

## 2020-10-07 DIAGNOSIS — Z01419 Encounter for gynecological examination (general) (routine) without abnormal findings: Secondary | ICD-10-CM | POA: Diagnosis not present

## 2020-10-07 DIAGNOSIS — Z6832 Body mass index (BMI) 32.0-32.9, adult: Secondary | ICD-10-CM | POA: Diagnosis not present

## 2020-10-29 ENCOUNTER — Encounter: Payer: Self-pay | Admitting: Family Medicine

## 2020-11-02 ENCOUNTER — Other Ambulatory Visit: Payer: BC Managed Care – PPO

## 2020-11-02 ENCOUNTER — Other Ambulatory Visit: Payer: Self-pay

## 2020-11-02 DIAGNOSIS — E785 Hyperlipidemia, unspecified: Secondary | ICD-10-CM | POA: Diagnosis not present

## 2020-11-02 DIAGNOSIS — I10 Essential (primary) hypertension: Secondary | ICD-10-CM | POA: Diagnosis not present

## 2020-11-02 DIAGNOSIS — E118 Type 2 diabetes mellitus with unspecified complications: Secondary | ICD-10-CM | POA: Diagnosis not present

## 2020-11-03 ENCOUNTER — Encounter: Payer: Self-pay | Admitting: *Deleted

## 2020-11-03 LAB — COMPLETE METABOLIC PANEL WITH GFR
AG Ratio: 1.4 (calc) (ref 1.0–2.5)
ALT: 20 U/L (ref 6–29)
AST: 21 U/L (ref 10–35)
Albumin: 4.3 g/dL (ref 3.6–5.1)
Alkaline phosphatase (APISO): 53 U/L (ref 37–153)
BUN: 13 mg/dL (ref 7–25)
CO2: 26 mmol/L (ref 20–32)
Calcium: 9.3 mg/dL (ref 8.6–10.4)
Chloride: 104 mmol/L (ref 98–110)
Creat: 0.67 mg/dL (ref 0.50–1.05)
GFR, Est African American: 115 mL/min/{1.73_m2} (ref >=60)
GFR, Est Non African American: 100 mL/min/{1.73_m2} (ref >=60)
Globulin: 3.1 g/dL (calc) (ref 1.9–3.7)
Glucose, Bld: 118 mg/dL — ABNORMAL HIGH (ref 65–99)
Potassium: 4 mmol/L (ref 3.5–5.3)
Sodium: 139 mmol/L (ref 135–146)
Total Bilirubin: 0.3 mg/dL (ref 0.2–1.2)
Total Protein: 7.4 g/dL (ref 6.1–8.1)

## 2020-11-03 LAB — CBC WITH DIFFERENTIAL/PLATELET
Absolute Monocytes: 398 cells/uL (ref 200–950)
Basophils Absolute: 42 cells/uL (ref 0–200)
Basophils Relative: 0.5 %
Eosinophils Absolute: 423 cells/uL (ref 15–500)
Eosinophils Relative: 5.1 %
HCT: 38.5 % (ref 35.0–45.0)
Hemoglobin: 12.7 g/dL (ref 11.7–15.5)
Lymphs Abs: 3395 cells/uL (ref 850–3900)
MCH: 29.9 pg (ref 27.0–33.0)
MCHC: 33 g/dL (ref 32.0–36.0)
MCV: 90.6 fL (ref 80.0–100.0)
MPV: 10.7 fL (ref 7.5–12.5)
Monocytes Relative: 4.8 %
Neutro Abs: 4042 cells/uL (ref 1500–7800)
Neutrophils Relative %: 48.7 %
Platelets: 241 10*3/uL (ref 140–400)
RBC: 4.25 10*6/uL (ref 3.80–5.10)
RDW: 13.1 % (ref 11.0–15.0)
Total Lymphocyte: 40.9 %
WBC: 8.3 10*3/uL (ref 3.8–10.8)

## 2020-11-03 LAB — LIPID PANEL
Cholesterol: 146 mg/dL (ref <200)
HDL: 45 mg/dL — ABNORMAL LOW (ref >=50)
LDL Cholesterol (Calc): 81 mg/dL (calc)
Non-HDL Cholesterol (Calc): 101 mg/dL (calc) (ref <130)
Total CHOL/HDL Ratio: 3.2 (calc) (ref <5.0)
Triglycerides: 106 mg/dL (ref <150)

## 2020-11-03 LAB — HEMOGLOBIN A1C
Hgb A1c MFr Bld: 6.5 % of total Hgb — ABNORMAL HIGH (ref <5.7)
Mean Plasma Glucose: 140 mg/dL
eAG (mmol/L): 7.7 mmol/L

## 2020-11-04 ENCOUNTER — Telehealth: Payer: Self-pay

## 2020-11-04 ENCOUNTER — Encounter: Payer: Self-pay | Admitting: Family Medicine

## 2020-11-04 NOTE — Telephone Encounter (Signed)
Pt called about lab results, has question about Alc, number has increased. She also wants you to know she is going back to therapy due to depression and anxiety. Will follow up with you soon

## 2020-11-09 NOTE — Telephone Encounter (Signed)
HgA1c is 6.5.  Has been 6.7 in past but was better recently.  She is on metformin for this.  She is a diabetic, but her level is considered controlled.  Weight loss and diet will help.

## 2020-11-30 ENCOUNTER — Ambulatory Visit: Payer: BC Managed Care – PPO | Admitting: Family Medicine

## 2020-11-30 ENCOUNTER — Encounter: Payer: Self-pay | Admitting: Family Medicine

## 2020-11-30 ENCOUNTER — Other Ambulatory Visit: Payer: Self-pay

## 2020-11-30 VITALS — BP 138/72 | HR 100 | Temp 98.3°F | Resp 14 | Ht 62.0 in | Wt 171.0 lb

## 2020-11-30 DIAGNOSIS — J4520 Mild intermittent asthma, uncomplicated: Secondary | ICD-10-CM

## 2020-11-30 DIAGNOSIS — J310 Chronic rhinitis: Secondary | ICD-10-CM

## 2020-11-30 DIAGNOSIS — J329 Chronic sinusitis, unspecified: Secondary | ICD-10-CM | POA: Diagnosis not present

## 2020-11-30 MED ORDER — PREDNISONE 20 MG PO TABS: ORAL_TABLET | ORAL | 0 refills | Status: DC

## 2020-11-30 NOTE — Progress Notes (Signed)
Subjective:    Patient ID: Stephanie [name], female    DOB: 1966/06/21, 55 y.o.   MRN: [id number]  HPI Symptoms began about 3 weeks ago with pain and pressure in her right ear.  Over the last week she has developed cough, wheezing, chest congestion.  She reports head pressure and rhinorrhea and sinus pressure.  She denies any sinus pain.  She denies any sore throat.  She denies any chest pain although she does have pain with coughing and some pleurisy with deep inspiration.  She denies any angina type pain.  She denies any purulent sputum.  She denies any hemoptysis.  She does have a hoarse voice, postnasal drip, and ear pressure in her right ear.  On exam today she is faintly wheezing. Past Medical History:  Diagnosis Date  . COVID-19 08/19/2019   Symptoms resolved 08/22/19  . Diabetes mellitus type 2 in obese ([place])   . Family history of adverse reaction to anesthesia    son - Malignant Hyperthermia   . GERD (gastroesophageal reflux disease)   . High cholesterol   . Hyperlipidemia   . Hypertension   . Tubular adenoma of colon   . Wears contact lenses    Past Surgical History:  Procedure Laterality Date  . CESAREAN SECTION    . CHOLECYSTECTOMY    . COLONOSCOPY WITH PROPOFOL N/A 10/21/2019   Procedure: COLONOSCOPY WITH PROPOFOL;  Surgeon: Jonathon Bellows, MD;  Location[facility] ENDOSCOPY;  Service: Endoscopy;  Laterality: N/A;  Diabetic - oral meds COVID (+) - 08/19/19  . NASAL SINUS SURGERY  2017   Dr.Byers [facility]   Current Outpatient Medications on File Prior to Visit  Medication Sig Dispense Refill  . lisinopril-hydrochlorothiazide (ZESTORETIC) 20-12.5 MG tablet TAKE 1 TABLET BY MOUTH ONCE DAILY 90 tablet 3  . losartan (COZAAR) 50 MG tablet TAKE 1 TABLET BY MOUTH ONCE DAILY 90 tablet 3  . metFORMIN (GLUCOPHAGE) 500 MG tablet TAKE 1 TABLET BY MOUTH TWICE (2) DAILY WITH A MEAL 180 tablet 1  . montelukast (SINGULAIR) 10 MG tablet TAKE 1 TABLET BY MOUTH EVERY NIGHT AT BEDTIME 30 tablet 5  .  pantoprazole (PROTONIX) 20 MG tablet Take 2 tablets (40 mg total) by mouth daily for 14 days. 28 tablet 0  . progesterone (PROMETRIUM) 100 MG capsule Take 100 mg by mouth daily.    . rosuvastatin (CRESTOR) 20 MG tablet TAKE 1 TABLET BY MOUTH ONCE DAILY 90 tablet 3   No current facility-administered medications on file prior to visit.    No Known Allergies Social History   Socioeconomic History  . Marital status: Married    Spouse name: Not on file  . Number of children: Not on file  . Years of education: Not on file  . Highest education level: Not on file  Occupational History  . Not on file  Tobacco Use  . Smoking status: Never Smoker  . Smokeless tobacco: Never Used  Vaping Use  . Vaping Use: Never used  Substance and Sexual Activity  . Alcohol use: No  . Drug use: Never  . Sexual activity: Yes    Comment: divorced  Other Topics Concern  . Not on file  Social History Narrative   ** Merged History Encounter **       Social Determinants of Health   Financial Resource Strain: Not on file  Food Insecurity: Not on file  Transportation Needs: Not on file  Physical Activity: Not on file  Stress: Not on file  Social  Connections: Not on file  Intimate Partner Violence: Not on file     Review of Systems  All other systems reviewed and are negative.      Objective:   Physical Exam Vitals reviewed.  Constitutional:      General: She is not in acute distress.    Appearance: She is well-developed. She is not diaphoretic.  HENT:     Right Ear: Tympanic membrane and ear canal normal.     Left Ear: Tympanic membrane and ear canal normal.     Nose: Congestion and rhinorrhea present.  Eyes:     Conjunctiva/sclera: Conjunctivae normal.  Cardiovascular:     Rate and Rhythm: Normal rate and regular rhythm.     Pulses: Normal pulses.     Heart sounds: Normal heart sounds. No murmur heard.   Pulmonary:     Effort: Pulmonary effort is normal. No respiratory distress.      Breath sounds: No stridor. Wheezing present. No rhonchi or rales.  Abdominal:     General: Abdomen is flat. Bowel sounds are normal.     Palpations: Abdomen is soft.  Musculoskeletal:     Cervical back: No rigidity.  Lymphadenopathy:     Cervical: No cervical adenopathy.  Neurological:     General: No focal deficit present.     Mental Status: She is oriented to person, place, and time.     Cranial Nerves: No cranial nerve deficit.           Assessment & Plan:  Mild intermittent reactive airway disease without complication  Rhinosinusitis  I believe the patient is having rhinosinusitis as well as a mild asthma/reactive airway disease exacerbation most likely due to allergies.  Recommended a prednisone taper pack and then reassess in 1 week or sooner if worsening.  Consider treatment for a sinus infection if worsening.

## 2020-12-02 DIAGNOSIS — F4322 Adjustment disorder with anxiety: Secondary | ICD-10-CM | POA: Diagnosis not present

## 2020-12-18 DIAGNOSIS — H43812 Vitreous degeneration, left eye: Secondary | ICD-10-CM | POA: Diagnosis not present

## 2020-12-18 DIAGNOSIS — H43391 Other vitreous opacities, right eye: Secondary | ICD-10-CM | POA: Diagnosis not present

## 2020-12-24 ENCOUNTER — Other Ambulatory Visit: Payer: Self-pay | Admitting: Family Medicine

## 2021-01-13 DIAGNOSIS — H43391 Other vitreous opacities, right eye: Secondary | ICD-10-CM | POA: Diagnosis not present

## 2021-01-13 DIAGNOSIS — H43812 Vitreous degeneration, left eye: Secondary | ICD-10-CM | POA: Diagnosis not present

## 2021-01-14 DIAGNOSIS — H43812 Vitreous degeneration, left eye: Secondary | ICD-10-CM | POA: Diagnosis not present

## 2021-01-14 DIAGNOSIS — H35363 Drusen (degenerative) of macula, bilateral: Secondary | ICD-10-CM | POA: Diagnosis not present

## 2021-01-14 DIAGNOSIS — E119 Type 2 diabetes mellitus without complications: Secondary | ICD-10-CM | POA: Diagnosis not present

## 2021-01-14 LAB — HM DIABETES EYE EXAM

## 2021-01-26 ENCOUNTER — Encounter: Payer: Self-pay | Admitting: *Deleted

## 2021-02-26 ENCOUNTER — Other Ambulatory Visit: Payer: Self-pay | Admitting: Family Medicine

## 2021-02-26 DIAGNOSIS — J0141 Acute recurrent pansinusitis: Secondary | ICD-10-CM

## 2021-03-10 ENCOUNTER — Encounter: Payer: Self-pay | Admitting: Family Medicine

## 2021-03-11 ENCOUNTER — Other Ambulatory Visit: Payer: Self-pay | Admitting: Family Medicine

## 2021-03-11 MED ORDER — HYDROCHLOROTHIAZIDE 25 MG PO TABS: 25.0000 mg | ORAL_TABLET | Freq: Every day | ORAL | 3 refills | Status: DC

## 2021-03-16 ENCOUNTER — Telehealth: Payer: Self-pay | Admitting: *Deleted

## 2021-03-16 NOTE — Telephone Encounter (Signed)
Received call from patient. Reports that she has been having N/V throughout the night.   States that she contacted GI and following recommendations given: Spoke with patient and she states that she has still been experiencing epigastric burning. She notes that last night she had a small piece of apple that increased her pain. It "didn't go down well". She has been having intermittent episodes of nausea with emesis since 9 pm last night. Emesis is mainly yellow appearing per patient. Can only tolerate a bit of water and has tried Gatorade. She notes that her epigastric region is sore and tender. She does have a hiatal hernia. She is requesting an urgent appointment today with a GI provider. Advised patient that the providers do not have any current openings today, however I would forward the message to a provider for review and recommendations. In the interim, it was recommended to touch base with her PCP to inquire if they can see her today or to come to our [facility] on the first floor for acute evaluation. Advised that her PCP and/or [facility]IC may recommend the ER for potential of IV fluids/medications. Advised this could always be a potential recommendation as well from our GI providers. She verbalized understanding to all information provided today. Electronically signed by Sande Brothers, RN at 03/16/2021 8:19 AM EDT  Advised to go to UC/ ER for evaluation.

## 2021-03-17 NOTE — Telephone Encounter (Signed)
Call placed to patient and patient made aware.   Patient has appointment scheduled on Friday. States that if Sx worsen or do not improve, she will go to UC. Otherwise she will keep appointment with PCP.

## 2021-03-19 ENCOUNTER — Encounter: Payer: Self-pay | Admitting: Family Medicine

## 2021-03-19 ENCOUNTER — Other Ambulatory Visit: Payer: Self-pay

## 2021-03-19 ENCOUNTER — Ambulatory Visit: Payer: BC Managed Care – PPO | Admitting: Family Medicine

## 2021-03-19 VITALS — BP 122/60 | HR 96 | Temp 98.7°F | Resp 14 | Ht 62.0 in | Wt 167.0 lb

## 2021-03-19 DIAGNOSIS — R1013 Epigastric pain: Secondary | ICD-10-CM

## 2021-03-19 MED ORDER — PANTOPRAZOLE SODIUM 40 MG PO TBEC: 40.0000 mg | DELAYED_RELEASE_TABLET | Freq: Two times a day (BID) | ORAL | 3 refills | Status: DC

## 2021-03-19 NOTE — Progress Notes (Signed)
Subjective:    Patient ID: Stephanie [name], female    DOB: [birth year], 55 y.o.   MRN: [id number]  Patient states that she has been coughing for more than a month.  She denies any purulent sputum.  Cough is dry nonproductive.  She denies any chest pain or shortness of breath or dyspnea on exertion.  She denies any fevers or chills or hemoptysis.  She does report a scratchy throat that has been scratchy for about a month as well and some mild hoarseness.  She also reports burning in her esophagus whenever she tries to swallow particular cold liquids which burn as she swallows them down her throat.  She also has a history of a hiatal hernia.  Recently she developed epigastric discomfort.  She is tender to palpation below the xiphoid process.  She does not drink.  She is already had her gallbladder removed.  She denies any melena or hematochezia although she has recently started developing loose stools every time she eats over the last 4 days.  They have a bright yellow color.  She does not appear clinically jaundiced.  There is no scleral icterus.  She is on hydrochlorothiazide for hypertension Past Medical History:  Diagnosis Date   COVID-19 08/19/2019   Symptoms resolved 08/22/19   Diabetes mellitus type 2 in obese [facility])    Family history of adverse reaction to anesthesia    son - Malignant Hyperthermia    GERD (gastroesophageal reflux disease)    High cholesterol    Hyperlipidemia    Hypertension    Tubular adenoma of colon    Wears contact lenses    Past Surgical History:  Procedure Laterality Date   CESAREAN SECTION     CHOLECYSTECTOMY     COLONOSCOPY WITH PROPOFOL N/A 10/21/2019   Procedure: COLONOSCOPY WITH PROPOFOL;  Surgeon: Jonathon Bellows, MD;  Location: [facility] ENDOSCOPY;  Service: Endoscopy;  Laterality: N/A;  Diabetic - oral meds COVID (+) - 08/19/19   NASAL SINUS SURGERY  2017   Dr.Byers [facility]   Current Outpatient Medications on File Prior to Visit  Medication Sig Dispense Refill    hydrochlorothiazide (HYDRODIURIL) 25 MG tablet Take 1 tablet (25 mg total) by mouth daily. 90 tablet 3   metFORMIN (GLUCOPHAGE) 500 MG tablet TAKE 1 TABLET BY MOUTH TWICE (2) DAILY WITH A MEAL 180 tablet 1   montelukast (SINGULAIR) 10 MG tablet TAKE 1 TABLET BY MOUTH EVERY NIGHT AT BEDTIME 30 tablet 5   omeprazole (PRILOSEC OTC) 20 MG tablet Take 20 mg by mouth daily.     progesterone (PROMETRIUM) 100 MG capsule Take 100 mg by mouth daily.     rosuvastatin (CRESTOR) 20 MG tablet TAKE 1 TABLET BY MOUTH ONCE DAILY 90 tablet 3   sucralfate (CARAFATE) 1 g tablet Take by mouth.     No current facility-administered medications on file prior to visit.    No Known Allergies Social History   Socioeconomic History   Marital status: Married    Spouse name: Not on file   Number of children: Not on file   Years of education: Not on file   Highest education level: Not on file  Occupational History   Not on file  Tobacco Use   Smoking status: Never   Smokeless tobacco: Never  Vaping Use   Vaping Use: Never used  Substance and Sexual Activity   Alcohol use: No   Drug use: Never   Sexual activity: Yes    Comment: divorced  Other  Topics Concern   Not on file  Social History Narrative   ** Merged History Encounter **       Social Determinants of Health   Financial Resource Strain: Not on file  Food Insecurity: Not on file  Transportation Needs: Not on file  Physical Activity: Not on file  Stress: Not on file  Social Connections: Not on file  Intimate Partner Violence: Not on file     Review of Systems  All other systems reviewed and are negative.     Objective:   Physical Exam Vitals reviewed.  Constitutional:      General: She is not in acute distress.    Appearance: She is well-developed. She is not diaphoretic.  HENT:     Right Ear: Tympanic membrane and ear canal normal.     Left Ear: Tympanic membrane and ear canal normal.  Eyes:     Conjunctiva/sclera:  Conjunctivae normal.  Cardiovascular:     Rate and Rhythm: Normal rate and regular rhythm.     Pulses: Normal pulses.     Heart sounds: Normal heart sounds. No murmur heard. Pulmonary:     Effort: Pulmonary effort is normal. No respiratory distress.     Breath sounds: No stridor. No wheezing, rhonchi or rales.  Abdominal:     General: Abdomen is flat. Bowel sounds are normal.     Palpations: Abdomen is soft.     Tenderness: There is abdominal tenderness in the epigastric area.    Musculoskeletal:     Cervical back: No rigidity.  Lymphadenopathy:     Cervical: No cervical adenopathy.  Neurological:     General: No focal deficit present.     Mental Status: She is oriented to person, place, and time.     Cranial Nerves: No cranial nerve deficit.          Assessment & Plan:  Epigastric abdominal pain - Plan: CBC with Differential/Platelet, COMPLETE METABOLIC PANEL WITH GFR, Lipase Differential diagnosis for the patient's epigastric pain will need to be pancreatitis versus peptic ulcer disease.  Given the 1 month history of cough, the chronic sore throat, and the dysphagia, I feel that the patient likely has had uncontrolled reflux causing laryngal esophageal reflux and coughing and likely has developed peptic ulcer disease.  Therefore we will start the patient on Protonix 40 mg twice daily and add Carafate 1 g p.o. q. ACH S.  The other potential differential diagnoses would be pancreatitis but she has very few risk factors other than hydrochlorothiazide.  I have asked her to hold the hydrochlorothiazide and I will check a CBC CMP and a lipase.  I do not believe that the yellow stool represents elevated bilirubin as the patient is not clinically jaundiced.

## 2021-03-20 LAB — CBC WITH DIFFERENTIAL/PLATELET
Absolute Monocytes: 522 cells/uL (ref 200–950)
Basophils Absolute: 58 cells/uL (ref 0–200)
Basophils Relative: 0.5 %
Eosinophils Absolute: 348 cells/uL (ref 15–500)
Eosinophils Relative: 3 %
HCT: 41.5 % (ref 35.0–45.0)
Hemoglobin: 14.3 g/dL (ref 11.7–15.5)
Lymphs Abs: 4953 cells/uL — ABNORMAL HIGH (ref 850–3900)
MCH: 30.3 pg (ref 27.0–33.0)
MCHC: 34.5 g/dL (ref 32.0–36.0)
MCV: 87.9 fL (ref 80.0–100.0)
MPV: 11 fL (ref 7.5–12.5)
Monocytes Relative: 4.5 %
Neutro Abs: 5719 cells/uL (ref 1500–7800)
Neutrophils Relative %: 49.3 %
Platelets: 325 10*3/uL (ref 140–400)
RBC: 4.72 10*6/uL (ref 3.80–5.10)
RDW: 13 % (ref 11.0–15.0)
Total Lymphocyte: 42.7 %
WBC: 11.6 10*3/uL — ABNORMAL HIGH (ref 3.8–10.8)

## 2021-03-20 LAB — COMPLETE METABOLIC PANEL WITH GFR
AG Ratio: 1.5 (calc) (ref 1.0–2.5)
ALT: 23 U/L (ref 6–29)
AST: 34 U/L (ref 10–35)
Albumin: 4.9 g/dL (ref 3.6–5.1)
Alkaline phosphatase (APISO): 60 U/L (ref 37–153)
BUN: 10 mg/dL (ref 7–25)
CO2: 31 mmol/L (ref 20–32)
Calcium: 10 mg/dL (ref 8.6–10.4)
Chloride: 93 mmol/L — ABNORMAL LOW (ref 98–110)
Creat: 0.73 mg/dL (ref 0.50–1.03)
Globulin: 3.3 g/dL (calc) (ref 1.9–3.7)
Glucose, Bld: 107 mg/dL — ABNORMAL HIGH (ref 65–99)
Potassium: 3.1 mmol/L — ABNORMAL LOW (ref 3.5–5.3)
Sodium: 137 mmol/L (ref 135–146)
Total Bilirubin: 0.6 mg/dL (ref 0.2–1.2)
Total Protein: 8.2 g/dL — ABNORMAL HIGH (ref 6.1–8.1)
eGFR: 97 mL/min/{1.73_m2} (ref >=60)

## 2021-03-20 LAB — LIPASE: Lipase: 16 U/L (ref 7–60)

## 2021-03-21 ENCOUNTER — Encounter: Payer: Self-pay | Admitting: Family Medicine

## 2021-03-21 DIAGNOSIS — E876 Hypokalemia: Secondary | ICD-10-CM

## 2021-03-22 MED ORDER — POTASSIUM CHLORIDE CRYS ER 20 MEQ PO TBCR: 20.0000 meq | EXTENDED_RELEASE_TABLET | Freq: Every day | ORAL | 3 refills | Status: DC

## 2021-03-23 DIAGNOSIS — R1013 Epigastric pain: Secondary | ICD-10-CM | POA: Diagnosis not present

## 2021-03-23 DIAGNOSIS — K219 Gastro-esophageal reflux disease without esophagitis: Secondary | ICD-10-CM | POA: Diagnosis not present

## 2021-03-25 ENCOUNTER — Encounter: Payer: Self-pay | Admitting: Family Medicine

## 2021-03-26 DIAGNOSIS — K219 Gastro-esophageal reflux disease without esophagitis: Secondary | ICD-10-CM | POA: Diagnosis not present

## 2021-03-26 DIAGNOSIS — K2950 Unspecified chronic gastritis without bleeding: Secondary | ICD-10-CM | POA: Diagnosis not present

## 2021-03-26 DIAGNOSIS — R1013 Epigastric pain: Secondary | ICD-10-CM | POA: Diagnosis not present

## 2021-03-26 DIAGNOSIS — R112 Nausea with vomiting, unspecified: Secondary | ICD-10-CM | POA: Diagnosis not present

## 2021-03-26 DIAGNOSIS — K2970 Gastritis, unspecified, without bleeding: Secondary | ICD-10-CM | POA: Diagnosis not present

## 2021-03-26 NOTE — Telephone Encounter (Signed)
Call placed to patient.   Patient reports that BP at home has been running 130.'s/ 70's. States that she had EGD today and BP noted in 150's. Does report that she was very nervous about procedure though.   Advised to continue to monitor BP daily.   Go to ER if elevated BP with headache, vision changes, SOB or chest pain noted.   Will discuss readings next week with PCP.

## 2021-03-31 ENCOUNTER — Other Ambulatory Visit: Payer: Self-pay

## 2021-03-31 ENCOUNTER — Encounter: Payer: Self-pay | Admitting: Family Medicine

## 2021-03-31 ENCOUNTER — Other Ambulatory Visit: Payer: BC Managed Care – PPO

## 2021-03-31 DIAGNOSIS — E876 Hypokalemia: Secondary | ICD-10-CM

## 2021-04-01 LAB — BASIC METABOLIC PANEL
BUN: 7 mg/dL (ref 7–25)
CO2: 26 mmol/L (ref 20–32)
Calcium: 9.2 mg/dL (ref 8.6–10.4)
Chloride: 104 mmol/L (ref 98–110)
Creat: 0.64 mg/dL (ref 0.50–1.03)
Glucose, Bld: 151 mg/dL — ABNORMAL HIGH (ref 65–99)
Potassium: 3.9 mmol/L (ref 3.5–5.3)
Sodium: 139 mmol/L (ref 135–146)

## 2021-04-19 ENCOUNTER — Other Ambulatory Visit: Payer: Self-pay

## 2021-04-19 ENCOUNTER — Emergency Department: Payer: BC Managed Care – PPO

## 2021-04-19 ENCOUNTER — Emergency Department
Admission: EM | Admit: 2021-04-19 | Discharge: 2021-04-19 | Disposition: A | Discharge status: Home / Self Care | Payer: BC Managed Care – PPO | Attending: Emergency Medicine | Admitting: Emergency Medicine

## 2021-04-19 ENCOUNTER — Telehealth: Payer: Self-pay | Admitting: *Deleted

## 2021-04-19 DIAGNOSIS — I10 Essential (primary) hypertension: Secondary | ICD-10-CM

## 2021-04-19 DIAGNOSIS — Z8616 Personal history of COVID-19: Secondary | ICD-10-CM | POA: Diagnosis not present

## 2021-04-19 DIAGNOSIS — R519 Headache, unspecified: Secondary | ICD-10-CM | POA: Diagnosis not present

## 2021-04-19 DIAGNOSIS — E119 Type 2 diabetes mellitus without complications: Secondary | ICD-10-CM | POA: Diagnosis not present

## 2021-04-19 DIAGNOSIS — R079 Chest pain, unspecified: Secondary | ICD-10-CM | POA: Diagnosis not present

## 2021-04-19 DIAGNOSIS — R0789 Other chest pain: Secondary | ICD-10-CM | POA: Insufficient documentation

## 2021-04-19 DIAGNOSIS — Z7984 Long term (current) use of oral hypoglycemic drugs: Secondary | ICD-10-CM | POA: Insufficient documentation

## 2021-04-19 DIAGNOSIS — Z79899 Other long term (current) drug therapy: Secondary | ICD-10-CM | POA: Diagnosis not present

## 2021-04-19 DIAGNOSIS — Z20822 Contact with and (suspected) exposure to covid-19: Secondary | ICD-10-CM | POA: Diagnosis not present

## 2021-04-19 DIAGNOSIS — Z0389 Encounter for observation for other suspected diseases and conditions ruled out: Secondary | ICD-10-CM | POA: Diagnosis not present

## 2021-04-19 LAB — COMPREHENSIVE METABOLIC PANEL
ALT: 24 U/L (ref 0–44)
AST: 30 U/L (ref 15–41)
Albumin: 4.2 g/dL (ref 3.5–5.0)
Alkaline Phosphatase: 54 U/L (ref 38–126)
Anion gap: 10 (ref 5–15)
BUN: 8 mg/dL (ref 6–20)
CO2: 26 mmol/L (ref 22–32)
Calcium: 9.4 mg/dL (ref 8.9–10.3)
Chloride: 102 mmol/L (ref 98–111)
Creatinine, Ser: 0.72 mg/dL (ref 0.44–1.00)
GFR, Estimated: >60 mL/min (ref >60)
Glucose, Bld: 121 mg/dL — ABNORMAL HIGH (ref 70–99)
Potassium: 3.6 mmol/L (ref 3.5–5.1)
Sodium: 138 mmol/L (ref 135–145)
Total Bilirubin: 0.8 mg/dL (ref 0.3–1.2)
Total Protein: 7.9 g/dL (ref 6.5–8.1)

## 2021-04-19 LAB — CBC
HCT: 39 % (ref 36.0–46.0)
Hemoglobin: 13.5 g/dL (ref 12.0–15.0)
MCH: 31 pg (ref 26.0–34.0)
MCHC: 34.6 g/dL (ref 30.0–36.0)
MCV: 89.7 fL (ref 80.0–100.0)
Platelets: 265 10*3/uL (ref 150–400)
RBC: 4.35 MIL/uL (ref 3.87–5.11)
RDW: 13 % (ref 11.5–15.5)
WBC: 12.1 10*3/uL — ABNORMAL HIGH (ref 4.0–10.5)
nRBC: 0 % (ref 0.0–0.2)

## 2021-04-19 LAB — SARS CORONAVIRUS 2 (TAT 6-24 HRS): SARS Coronavirus 2: NEGATIVE

## 2021-04-19 LAB — TROPONIN I (HIGH SENSITIVITY)
Troponin I (High Sensitivity): 6 ng/L (ref <18)
Troponin I (High Sensitivity): 5 ng/L (ref <18)

## 2021-04-19 NOTE — Telephone Encounter (Signed)
Received call from patient.   Reports that BP noted elevated and she was having atypical chest pain. Reports that she was seen in ER.  States that BP readings have remained high (140- 150's/ 80- 90's). Inquired as to if she should resume HCTZ. Of note, K+ noted at 3.6.  Also reports that she has had sinus pressure and facial swelling x5 days. Also reports that she is having gland swelling and and R ear pressure. Noted that WBC elevated in ER. Requested ABTx.  Please advise.

## 2021-04-19 NOTE — ED Triage Notes (Signed)
Pt states left arm tingling, left sided chest pain and emesis x1 today. Pt is anxious, tearful. Pt states she has had a headache since this morning. Resps unlabored.

## 2021-04-19 NOTE — ED Provider Notes (Signed)
[facility] Emergency Department Provider Note ____________________________________________   Event Date/Time   First MD Initiated Contact with Patient 04/19/21 [phone]     (approximate)  I have reviewed the triage vital signs and the nursing notes.   HISTORY  Chief Complaint Chest Pain    HPI Stephanie [name] is a 55 y.o. female with PMH as noted below including hypertension and high cholesterol who presents with several complaints that started yesterday evening, primarily chest pain, relatively mild in intensity, nonradiating, but associated with tingling in the left arm.  The patient then checked her blood pressure and noted the top number to be 164, which concerned her.  The patient also reports a headache since yesterday afternoon although it is not severe.  She has had migraines in the past and this feels similar.  She states that she became very anxious about the tingling and the elevated blood pressure and decided to come to the ER.  She states she is feeling significantly better now.  She still has mild tingling but the chest pain and headache are resolved.  Past Medical History:  Diagnosis Date   COVID-19 08/19/2019   Symptoms resolved 08/22/19   Diabetes mellitus type 2 in obese [facility])    Family history of adverse reaction to anesthesia    son - Malignant Hyperthermia    GERD (gastroesophageal reflux disease)    High cholesterol    Hyperlipidemia    Hypertension    Tubular adenoma of colon    Wears contact lenses     Patient Active Problem List   Diagnosis Date Noted   Tubular adenoma of colon    Prediabetes    Unilateral primary osteoarthritis, left knee 05/16/2017   Unilateral primary osteoarthritis, right knee 05/16/2017   Chest pain 11/22/2016   Hyperlipidemia    Hypertension    STRICTURE AND STENOSIS OF ESOPHAGUS 02/24/2009   GERD 02/12/2009   NAUSEA AND VOMITING 02/12/2009   ABDOMINAL PAIN-EPIGASTRIC 02/12/2009    Past Surgical  History:  Procedure Laterality Date   CESAREAN SECTION     CHOLECYSTECTOMY     COLONOSCOPY WITH PROPOFOL N/A 10/21/2019   Procedure: COLONOSCOPY WITH PROPOFOL;  Surgeon: Jonathon Bellows, MD;  Location: [facility] ENDOSCOPY;  Service: Endoscopy;  Laterality: N/A;  Diabetic - oral meds COVID (+) - 08/19/19   NASAL SINUS SURGERY  2017   Dr.[facility]    Prior to Admission medications   Medication Sig Start Date End Date Taking? Authorizing Provider  hydrochlorothiazide (HYDRODIURIL) 25 MG tablet Take 1 tablet (25 mg total) by mouth daily. 03/11/21   Susy Frizzle, MD  metFORMIN (GLUCOPHAGE) 500 MG tablet TAKE 1 TABLET BY MOUTH TWICE (2) DAILY WITH A MEAL 12/24/20   Susy Frizzle, MD  montelukast (SINGULAIR) 10 MG tablet TAKE 1 TABLET BY MOUTH EVERY NIGHT AT BEDTIME 02/26/21   Susy Frizzle, MD  omeprazole (PRILOSEC OTC) 20 MG tablet Take 20 mg by mouth daily.    [provider]  pantoprazole (PROTONIX) 40 MG tablet Take 1 tablet (40 mg total) by mouth 2 (two) times daily. 03/19/21   Susy Frizzle, MD  potassium chloride SA (KLOR-CON) 20 MEQ tablet Take 1 tablet (20 mEq total) by mouth daily. 03/22/21   Susy Frizzle, MD  progesterone (PROMETRIUM) 100 MG capsule Take 100 mg by mouth daily.    [provider]  rosuvastatin (CRESTOR) 20 MG tablet TAKE 1 TABLET BY MOUTH ONCE DAILY 05/25/20   Susy Frizzle,  MD  sucralfate (CARAFATE) 1 g tablet Take by mouth. 03/16/21 03/16/22  [provider]    Allergies Patient has no known allergies.  Family History  Problem Relation Age of Onset   Hypertension Mother    Hyperlipidemia Mother    Heart disease Father    Hyperlipidemia Brother    Hypertension Brother    Cancer Maternal Grandmother        brain tumor   Cancer Maternal Grandfather        retinoblastoma    Social History Social History   Tobacco Use   Smoking status: Never   Smokeless tobacco: Never  Vaping Use   Vaping Use: Never used  Substance Use  Topics   Alcohol use: No   Drug use: Never    Review of Systems  Constitutional: No fever. Eyes: No redness. ENT: No sore throat. Cardiovascular: Positive for resolved chest pain. Respiratory: Denies shortness of breath. Gastrointestinal: No vomiting or diarrhea.  Genitourinary: Negative for flank pain. Musculoskeletal: Negative for back pain. Skin: Negative for rash. Neurological: Positive for resolved for headache.  Positive for tingling in the left arm.   ____________________________________________   PHYSICAL EXAM:  VITAL SIGNS: ED Triage Vitals  Enc Vitals Group     BP 04/19/21 0112 (!) 160/88     Pulse Rate 04/19/21 0112 84     Resp 04/19/21 0112 16     Temp 04/19/21 0112 98.4 F (36.9 C)     Temp Source 04/19/21 0112 Oral     SpO2 04/19/21 0112 98 %     Weight 04/19/21 0110 163 lb (73.9 kg)     Height 04/19/21 0110 '5\' 2"'$  (1.575 m)     Head Circumference --      Peak Flow --      Pain Score 04/19/21 0110 6     Pain Loc --      Pain Edu? --      Excl. in [place]? --     Constitutional: Alert and oriented. Well appearing and in no acute distress. Eyes: Conjunctivae are normal.  Head: Atraumatic. Nose: No congestion/rhinnorhea. Mouth/Throat: Mucous membranes are moist.   Neck: Normal range of motion.  Cardiovascular: Normal rate, regular rhythm. Grossly normal heart sounds.  Good peripheral circulation. Respiratory: Normal respiratory effort.  No retractions. Lungs CTAB. Gastrointestinal: No distention.  Musculoskeletal: No lower extremity edema.  Extremities warm and well perfused.  Neurologic:  Normal speech and language.  No facial droop.  Motor and sensory intact in all extremities.  Normal grip strength bilaterally.  Normal gait.  Normal coordination. Skin:  Skin is warm and dry. No rash noted. Psychiatric: Mood and affect are normal. Speech and behavior are normal.  ____________________________________________   LABS (all labs ordered are listed, but  only abnormal results are displayed)  Labs Reviewed  CBC - Abnormal; Notable for the following components:      Result Value   WBC 12.1 (*)    All other components within normal limits  COMPREHENSIVE METABOLIC PANEL - Abnormal; Notable for the following components:   Glucose, Bld 121 (*)    All other components within normal limits  SARS CORONAVIRUS 2 (TAT 6-24 HRS)  TROPONIN I (HIGH SENSITIVITY)  TROPONIN I (HIGH SENSITIVITY)   ____________________________________________  EKG  ED ECG REPORT I, Arta Silence, the attending physician, personally viewed and interpreted this ECG.  Date: 04/19/2021 EKG Time: 0111 Rate: 88 Rhythm: normal sinus rhythm QRS Axis: normal Intervals: normal ST/T Wave abnormalities: normal Narrative Interpretation:  no evidence of acute ischemia  ____________________________________________  RADIOLOGY  Chest x-ray interpreted by me shows no focal consolidation or edema CT head: No ICH or other acute abnormality  ____________________________________________   PROCEDURES  Procedure(s) performed: No  Procedures  Critical Care performed: No ____________________________________________   INITIAL IMPRESSION / ASSESSMENT AND PLAN / ED COURSE  Pertinent labs & imaging results that were available during my care of the patient were reviewed by me and considered in my medical decision making (see chart for details).   55 year old female with PMH as noted above presents with atypical chest pain, left arm tingling, and elevated blood pressure.  I reviewed the past medical records in [place].  The patient has no recent prior ED visits or admissions.  She states that she was recently taken off of her blood pressure medication because it had been well controlled.  On exam currently, the patient is well-appearing.  Her vital signs are normal except for mild hypertension.  The physical exam is otherwise unremarkable.  Thorough neurologic exam is  nonfocal with no motor or sensory deficits.  Differential includes cervical radiculopathy, musculoskeletal pain, GERD, or other benign etiology.  The patient has had negative troponins x2 and an unremarkable EKG so I do not suspect ACS or other cardiac cause.  There is no clinical evidence for PE.  There are no neurologic deficits to suggest stroke or TIA.  The blood pressure here is only mildly elevated and not in a range that I would expect to cause a hypertensive urgency or any end organ dysfunction.  The symptoms have also mostly resolved.  At this time, the patient is feeling much better and is reassured.  She would like to go home.  She is stable for discharge at this time.  The RN had sent a COVID swab.  The patient does not want to wait for it, and I do not think that her symptoms are highly suggestive of COVID.  We will send off a PCR and call her if the results are positive.  I gave the patient thorough return precautions and she expressed understanding.   ____________________________________________   FINAL CLINICAL IMPRESSION(S) / ED DIAGNOSES  Final diagnoses:  Hypertension, unspecified type  Atypical chest pain  Acute nonintractable headache, unspecified headache type      NEW MEDICATIONS STARTED DURING THIS VISIT:  Discharge Medication List as of 04/19/2021  6:14 AM       Note:  This document was prepared using Dragon voice recognition software and may include unintentional dictation errors.    Arta Silence, MD 04/19/21 [phone]

## 2021-04-19 NOTE — Discharge Instructions (Addendum)
Contact your primary care doctor for follow-up.  Log your blood pressures over the next few days.  Return to the ER for new or worsening severe headache, chest pain, difficulty breathing, very high blood pressure (especially over 200 on the top number or 120 on the bottom number), weakness or numbness in the arm, or any other new or worsening symptoms that concern you.

## 2021-04-19 NOTE — ED Notes (Signed)
Pt denies cp at this time

## 2021-04-20 NOTE — Telephone Encounter (Signed)
Call placed to patient and patient made aware.  

## 2021-04-21 ENCOUNTER — Telehealth (INDEPENDENT_AMBULATORY_CARE_PROVIDER_SITE_OTHER): Payer: BC Managed Care – PPO | Admitting: Nurse Practitioner

## 2021-04-21 ENCOUNTER — Other Ambulatory Visit: Payer: Self-pay

## 2021-04-21 ENCOUNTER — Encounter: Payer: Self-pay | Admitting: Nurse Practitioner

## 2021-04-21 DIAGNOSIS — J0100 Acute maxillary sinusitis, unspecified: Secondary | ICD-10-CM

## 2021-04-21 DIAGNOSIS — I10 Essential (primary) hypertension: Secondary | ICD-10-CM

## 2021-04-21 MED ORDER — HYDROCHLOROTHIAZIDE 25 MG PO TABS: 12.5000 mg | ORAL_TABLET | Freq: Every day | ORAL | 3 refills | Status: DC

## 2021-04-21 NOTE — Progress Notes (Signed)
Subjective:    Patient ID: Stephanie [name], female    DOB: 1965-10-01, 55 y.o.   MRN: [id number]  HPI: Stephanie [name] is a 55 y.o. female presenting virtually for ER follow up and sinus pain.  Chief Complaint  Patient presents with   Sinusitis   Patient reports Monday night, woke up with chest pain, fingers were numb and blood pressure was elevated.  She went to the ER and was discharged home later that day.  Her work-up in the ER included an EKG, metabolic panel, blood counts, chest x-ray, CT of her head, and COVID testing.  All of the blood work was unremarkable with the exception of blood counts that showed mild leukocytosis.  Since then, her blood pressure at home has been elevated above goal of 140/90.  Recently, her hydrochlorothiazide was stopped due to hypokalemia.  She was previously on lisinopril, which gave her cough.  She is worried about this because she also has diabetes and she does not want her blood pressure to be elevated or cause future problems.  Today, she denies any chest pain, shortness of breath, or finger numbness.  UPPER RESPIRATORY TRACT INFECTION Onset: ~3 days COVID-19 testing history: Tested in the ER-negative Fever: no Cough: yes; dry Shortness of breath: no Wheezing: no Chest pain: no Chest tightness: no Chest congestion: no Nasal congestion: yes Runny nose: no Post nasal drip: yes Sneezing: yes Sore throat: no Swollen glands: yes Sinus pressure: yes Headache: yes Face pain: no Toothache: no Ear pain: yes  Ear pressure: no  Eyes red/itching:no Eye drainage/crusting: no  Nausea: no  Vomiting: no Diarrhea: no  Change in appetite: no  Loss of taste/smell: no  Rash: no Fatigue: no Sick contacts: no Strep contacts: no  Context: stable Recurrent sinusitis: no Treatments attempted: flonase Relief with OTC medications: No  No Known Allergies  Outpatient Encounter Medications as of 04/21/2021  Medication Sig   hydrochlorothiazide  (HYDRODIURIL) 25 MG tablet Take 1 tablet (25 mg total) by mouth daily.   metFORMIN (GLUCOPHAGE) 500 MG tablet TAKE 1 TABLET BY MOUTH TWICE (2) DAILY WITH A MEAL   montelukast (SINGULAIR) 10 MG tablet TAKE 1 TABLET BY MOUTH EVERY NIGHT AT BEDTIME   omeprazole (PRILOSEC OTC) 20 MG tablet Take 20 mg by mouth daily.   pantoprazole (PROTONIX) 40 MG tablet Take 1 tablet (40 mg total) by mouth 2 (two) times daily.   potassium chloride SA (KLOR-CON) 20 MEQ tablet Take 1 tablet (20 mEq total) by mouth daily.   progesterone (PROMETRIUM) 100 MG capsule Take 100 mg by mouth daily.   rosuvastatin (CRESTOR) 20 MG tablet TAKE 1 TABLET BY MOUTH ONCE DAILY   sucralfate (CARAFATE) 1 g tablet Take by mouth.   No facility-administered encounter medications on file as of 04/21/2021.    Patient Active Problem List   Diagnosis Date Noted   Tubular adenoma of colon    Prediabetes    Unilateral primary osteoarthritis, left knee 05/16/2017   Unilateral primary osteoarthritis, right knee 05/16/2017   Chest pain 11/22/2016   Hyperlipidemia    Hypertension    STRICTURE AND STENOSIS OF ESOPHAGUS 02/24/2009   GERD 02/12/2009   NAUSEA AND VOMITING 02/12/2009   ABDOMINAL PAIN-EPIGASTRIC 02/12/2009    Past Medical History:  Diagnosis Date   COVID-19 08/19/2019   Symptoms resolved 08/22/19   Diabetes mellitus type 2 in obese [facility])    Family history of adverse reaction to anesthesia    son - Malignant Hyperthermia  GERD (gastroesophageal reflux disease)    High cholesterol    Hyperlipidemia    Hypertension    Tubular adenoma of colon    Wears contact lenses     Relevant past medical, surgical, family and social history reviewed and updated as indicated. Interim medical history since our last visit reviewed.  Review of Systems Per HPI unless specifically indicated above     Objective:    There were no vitals taken for this visit.  Wt Readings from Last 3 Encounters:  04/19/21 163 lb (73.9 kg)   03/19/21 167 lb (75.8 kg)  11/30/20 171 lb (77.6 kg)    Physical Exam Physical examination unable to be performed due to lack of equipment.  Patient talking in complete sentences during telemedicine visit.    Assessment & Plan:  1. Primary hypertension Chronic.  Blood pressure elevated above goal of less than 130/80 at home. will resume hydrochlorothiazide-start with 12.5 mg daily.  Start taking potassium supplementation and follow-up in 2 weeks to recheck potassium level and blood pressure.  Continue checking blood pressure at home-keep a log and bring this with her to next appointment.  2. Acute non-recurrent maxillary sinusitis Acute.  Reassured patient that symptoms and are most consistent with a viral upper respiratory infection and explained lack of efficacy of antibiotics against viruses.  Discussed expected course and features suggestive of secondary bacterial infection.  Continue supportive care. Increase fluid intake with water or electrolyte solution like pedialyte. Encouraged acetaminophen as needed for fever/pain. Encouraged salt water gargling, chloraseptic spray and throat lozenges. Encouraged OTC guaifenesin. Encouraged saline sinus flushes and/or neti with humidified air.  If symptoms persist until this time next week, consider Augmentin for bacterial sinusitis.   Follow up plan: Return if symptoms worsen or fail to improve.   This visit was completed via telephone due to the restrictions of the COVID-19 pandemic. All issues as above were discussed and addressed but no physical exam was performed. If it was felt that the patient should be evaluated in the office, they were directed there. The patient verbally consented to this visit. Patient was unable to complete an audio/visual visit due to Lack of internet. Location of the patient: parking lot Location of the provider: work Those involved with this call:  Provider: Noemi Chapel, DNP, FNP-C CMA: n/a Front  Desk/Registration: Vevelyn Pat  Time spent on call:  18 minutes on the phone discussing health concerns. 12 minutes total spent in review of patient's record and preparation of their chart. I verified patient identity using two factors (patient name and date of birth). Patient consents verbally to being seen via telemedicine visit today.

## 2021-05-04 NOTE — Progress Notes (Signed)
Subjective:    Patient ID: Stephanie [name], female    DOB: 1966-06-14, 55 y.o.   MRN: [id number]  HPI: Stephanie [name] is a 55 y.o. female presenting for blood pressure follow up.  Chief Complaint  Patient presents with   Follow-up    Follow up , 2 week follow , think she will need antibiotic, discuss multi vit     HYPERTENSION Patient reports blood pressure is much improved since last visit.  She has been taking HCTZ 12.5 mg daily with potassium chloride 20 mEq daily. Hypertension status: better  Satisfied with current treatment? yes Duration of hypertension: chronic BP monitoring frequency:  daily BP range: 120/80 average BP medication side effects:  no Medication compliance: excellent Aspirin: no Recurrent headaches: no Visual changes: no Palpitations: no Dyspnea: no Chest pain: no Lower extremity edema: no Dizzy/lightheaded: no  UPPER RESPIRATORY TRACT INFECTION Onset: 04/18/21 COVID-19 testing history: tested negative with symptoms first started Fever: no Cough: no Shortness of breath: no Wheezing: no Chest pain: no Chest tightness: no Chest congestion: no Nasal congestion: yes Runny nose: no Post nasal drip: no Sneezing: no Sore throat: no Swollen glands: yes Sinus pressure: yes; cheeks and forehead Headache: yes Face pain: no Toothache: yes Ear pain: yes ; both R>L Ear pressure: no  Eyes red/itching:no Eye drainage/crusting: no  Nausea: no  Vomiting: no Diarrhea: no  Change in appetite: no  Loss of taste/smell: no  Rash: no Fatigue: yes Sick contacts: no Strep contacts: no  Context: stable Recurrent sinusitis: no Treatments attempted: flonase Relief with OTC medications: no  No Known Allergies  Outpatient Encounter Medications as of 05/05/2021  Medication Sig   amoxicillin-clavulanate (AUGMENTIN) 875-125 MG tablet Take 1 tablet by mouth 2 (two) times daily for 7 days.   hydrochlorothiazide (HYDRODIURIL) 25 MG tablet Take 0.5 tablets  (12.5 mg total) by mouth daily.   metFORMIN (GLUCOPHAGE) 500 MG tablet TAKE 1 TABLET BY MOUTH TWICE (2) DAILY WITH A MEAL   montelukast (SINGULAIR) 10 MG tablet TAKE 1 TABLET BY MOUTH EVERY NIGHT AT BEDTIME   omeprazole (PRILOSEC OTC) 20 MG tablet Take 20 mg by mouth daily.   pantoprazole (PROTONIX) 40 MG tablet Take 1 tablet (40 mg total) by mouth 2 (two) times daily.   potassium chloride SA (KLOR-CON) 20 MEQ tablet Take 1 tablet (20 mEq total) by mouth daily.   progesterone (PROMETRIUM) 100 MG capsule Take 100 mg by mouth daily.   rosuvastatin (CRESTOR) 20 MG tablet TAKE 1 TABLET BY MOUTH ONCE DAILY   sucralfate (CARAFATE) 1 g tablet Take by mouth.   No facility-administered encounter medications on file as of 05/05/2021.    Patient Active Problem List   Diagnosis Date Noted   Vitamin D deficiency 05/05/2021   Acute rhinosinusitis 05/05/2021   Tubular adenoma of colon    Prediabetes    Unilateral primary osteoarthritis, left knee 05/16/2017   Unilateral primary osteoarthritis, right knee 05/16/2017   Chest pain 11/22/2016   Hyperlipidemia    Hypertension    STRICTURE AND STENOSIS OF ESOPHAGUS 02/24/2009   GERD 02/12/2009   NAUSEA AND VOMITING 02/12/2009   ABDOMINAL PAIN-EPIGASTRIC 02/12/2009    Past Medical History:  Diagnosis Date   COVID-19 08/19/2019   Symptoms resolved 08/22/19   Diabetes mellitus type 2 in obese [facility])    Family history of adverse reaction to anesthesia    son - Malignant Hyperthermia    GERD (gastroesophageal reflux disease)    High cholesterol  Hyperlipidemia    Hypertension    Tubular adenoma of colon    Wears contact lenses     Relevant past medical, surgical, family and social history reviewed and updated as indicated. Interim medical history since our last visit reviewed.  Review of Systems Per HPI unless specifically indicated above     Objective:    BP 110/70 (BP Location: Left Arm, Patient Position: Sitting, Cuff Size: Normal)    Pulse 73   Ht '5\' 2"'$  (1.575 m)   Wt 167 lb (75.8 kg)   SpO2 98%   BMI 30.54 kg/m   Wt Readings from Last 3 Encounters:  05/05/21 167 lb (75.8 kg)  04/19/21 163 lb (73.9 kg)  03/19/21 167 lb (75.8 kg)    Physical Exam Vitals and nursing note reviewed.  Constitutional:      General: She is not in acute distress.    Appearance: Normal appearance. She is not toxic-appearing.  HENT:     Head: Normocephalic and atraumatic.     Right Ear: There is impacted cerumen.     Left Ear: There is impacted cerumen.     Nose: No congestion.     Right Sinus: Maxillary sinus tenderness and frontal sinus tenderness present.     Left Sinus: Maxillary sinus tenderness and frontal sinus tenderness present.     Mouth/Throat:     Mouth: Mucous membranes are moist.     Pharynx: Oropharynx is clear. No oropharyngeal exudate or posterior oropharyngeal erythema.  Eyes:     General: No scleral icterus.    Extraocular Movements: Extraocular movements intact.  Neck:     Vascular: No carotid bruit.  Cardiovascular:     Rate and Rhythm: Normal rate and regular rhythm.     Heart sounds: Normal heart sounds. No murmur heard. Pulmonary:     Effort: Pulmonary effort is normal. No respiratory distress.     Breath sounds: Normal breath sounds. No wheezing, rhonchi or rales.  Musculoskeletal:     Cervical back: Normal range of motion.  Skin:    General: Skin is warm and dry.     Coloration: Skin is not jaundiced or pale.     Findings: No erythema or rash.  Neurological:     Mental Status: She is alert and oriented to person, place, and time.     Motor: No weakness.     Gait: Gait normal.  Psychiatric:        Mood and Affect: Mood normal.        Behavior: Behavior normal.        Thought Content: Thought content normal.        Judgment: Judgment normal.      Assessment & Plan:   Problem List Items Addressed This Visit       Cardiovascular and Mediastinum   Hypertension - Primary    Chronic, improved  with HCTZ 12.5 mg daily.  Will plan to continue and recheck potassium with kidney function today.  Continue KCl supplementation for now pending electrolytes.  Follow up in 6 months.      Relevant Orders   BASIC METABOLIC PANEL WITH GFR     Respiratory   Acute rhinosinusitis    Acute.  Start Augmentin twice daily for 7 days.  Follow up if symptoms do not improve after treatment.      Relevant Medications   amoxicillin-clavulanate (AUGMENTIN) 875-125 MG tablet     Other   Vitamin D deficiency    Chronic.  Will check  Vitamin D level today and recommend supplementation as needed.      Relevant Orders   VITAMIN D 25 Hydroxy (Vit-D Deficiency, Fractures)   Other Visit Diagnoses     Hypokalemia       Relevant Orders   VITAMIN D 25 Hydroxy (Vit-D Deficiency, Fractures)        Follow up plan: Return in about 6 months (around 11/02/2021) for BP follow up.

## 2021-05-05 ENCOUNTER — Other Ambulatory Visit: Payer: Self-pay

## 2021-05-05 ENCOUNTER — Ambulatory Visit: Payer: BC Managed Care – PPO | Admitting: Nurse Practitioner

## 2021-05-05 ENCOUNTER — Encounter: Payer: Self-pay | Admitting: Nurse Practitioner

## 2021-05-05 VITALS — BP 110/70 | HR 73 | Ht 62.0 in | Wt 167.0 lb

## 2021-05-05 DIAGNOSIS — I10 Essential (primary) hypertension: Secondary | ICD-10-CM | POA: Diagnosis not present

## 2021-05-05 DIAGNOSIS — J019 Acute sinusitis, unspecified: Secondary | ICD-10-CM

## 2021-05-05 DIAGNOSIS — E876 Hypokalemia: Secondary | ICD-10-CM

## 2021-05-05 DIAGNOSIS — E559 Vitamin D deficiency, unspecified: Secondary | ICD-10-CM | POA: Diagnosis not present

## 2021-05-05 MED ORDER — AMOXICILLIN-POT CLAVULANATE 875-125 MG PO TABS: 1.0000 | ORAL_TABLET | Freq: Two times a day (BID) | ORAL | 0 refills | Status: AC

## 2021-05-05 NOTE — Assessment & Plan Note (Signed)
Acute.  Start Augmentin twice daily for 7 days.  Follow up if symptoms do not improve after treatment.

## 2021-05-05 NOTE — Assessment & Plan Note (Signed)
Chronic, improved with HCTZ 12.5 mg daily.  Will plan to continue and recheck potassium with kidney function today.  Continue KCl supplementation for now pending electrolytes.  Follow up in 6 months.

## 2021-05-05 NOTE — Assessment & Plan Note (Signed)
Chronic.  Will check Vitamin D level today and recommend supplementation as needed.

## 2021-05-06 LAB — BASIC METABOLIC PANEL WITH GFR
BUN: 10 mg/dL (ref 7–25)
CO2: 26 mmol/L (ref 20–32)
Calcium: 9.6 mg/dL (ref 8.6–10.4)
Chloride: 99 mmol/L (ref 98–110)
Creat: 0.67 mg/dL (ref 0.50–1.03)
Glucose, Bld: 91 mg/dL (ref 65–99)
Potassium: 3.7 mmol/L (ref 3.5–5.3)
Sodium: 139 mmol/L (ref 135–146)
eGFR: 103 mL/min/{1.73_m2} (ref >=60)

## 2021-05-06 LAB — VITAMIN D 25 HYDROXY (VIT D DEFICIENCY, FRACTURES): Vit D, 25-Hydroxy: 11 ng/mL — ABNORMAL LOW (ref 30–100)

## 2021-05-07 MED ORDER — VITAMIN D (ERGOCALCIFEROL) 1.25 MG (50000 UNIT) PO CAPS: 50000.0000 [IU] | ORAL_CAPSULE | ORAL | 0 refills | Status: DC

## 2021-05-07 NOTE — Addendum Note (Signed)
Addended by: Noemi Chapel A on: 05/07/2021 07:49 AM   Modules accepted: Orders

## 2021-05-11 ENCOUNTER — Encounter: Payer: Self-pay | Admitting: Nurse Practitioner

## 2021-05-12 ENCOUNTER — Other Ambulatory Visit: Payer: Self-pay | Admitting: Family Medicine

## 2021-06-02 LAB — HM DIABETES EYE EXAM

## 2021-06-03 ENCOUNTER — Encounter: Payer: Self-pay | Admitting: Family Medicine

## 2021-06-14 DIAGNOSIS — N644 Mastodynia: Secondary | ICD-10-CM | POA: Diagnosis not present

## 2021-06-22 ENCOUNTER — Other Ambulatory Visit: Payer: Self-pay | Admitting: Obstetrics and Gynecology

## 2021-06-22 DIAGNOSIS — N644 Mastodynia: Secondary | ICD-10-CM

## 2021-07-04 ENCOUNTER — Encounter: Payer: Self-pay | Admitting: Nurse Practitioner

## 2021-08-10 ENCOUNTER — Other Ambulatory Visit: Payer: Self-pay | Admitting: Family Medicine

## 2021-08-27 ENCOUNTER — Other Ambulatory Visit: Payer: Self-pay | Admitting: Family Medicine

## 2021-09-10 ENCOUNTER — Other Ambulatory Visit: Payer: Self-pay | Admitting: Family Medicine

## 2021-09-10 DIAGNOSIS — J0141 Acute recurrent pansinusitis: Secondary | ICD-10-CM

## 2021-10-05 ENCOUNTER — Encounter: Payer: Self-pay | Admitting: Family Medicine

## 2021-10-05 ENCOUNTER — Other Ambulatory Visit: Payer: Self-pay

## 2021-10-05 ENCOUNTER — Ambulatory Visit (INDEPENDENT_AMBULATORY_CARE_PROVIDER_SITE_OTHER): Payer: BC Managed Care – PPO | Admitting: Family Medicine

## 2021-10-05 VITALS — BP 128/88 | HR 76 | Temp 97.3°F | Resp 18 | Ht 62.0 in | Wt 166.0 lb

## 2021-10-05 DIAGNOSIS — M47812 Spondylosis without myelopathy or radiculopathy, cervical region: Secondary | ICD-10-CM | POA: Diagnosis not present

## 2021-10-05 DIAGNOSIS — M25512 Pain in left shoulder: Secondary | ICD-10-CM

## 2021-10-05 NOTE — Progress Notes (Signed)
Subjective:    Patient ID: Stephanie [name], female    DOB: August 20, 1966, 56 y.o.   MRN: [id number]  Arm Pain   Neck Pain    IMPRESSION:  1.  Normal noncontrast CT appearance of the brain.  2. No acute fracture or listhesis identified in the cervical spine.  Ligamentous injury is not excluded.  3. Chronic disc and endplate degeneration at C4-C5 and C5-C6  resulting in mild to moderate spinal stenosis and up to moderate to  severe right C6 foraminal stenosis.   The report included above is from a CT scan of the cervical spine obtained in 2015.  Patient states that over the last several months she has developed worsening pain in her neck.  It hurts at night and keeps her awake.  She also has pain radiating down her left arm.  The pain is primarily in her left shoulder and subacromial space.  She has pain with abduction greater than 90 degrees.  She has pain with internal rotation.  She has pain with empty can testing.  She has pain with Hawkins maneuver.  She also has some tenderness to palpation over the lateral epicondyle consistent with epicondylitis.  Is difficult to determine how much of the pain in her shoulder is cervical radiculopathy and how much is subacromial bursitis.  She also reports numbness and tingling and burning radiating into her shoulder. Past Medical History:  Diagnosis Date   COVID-19 08/19/2019   Symptoms resolved 08/22/19   Diabetes mellitus type 2 in obese [facility])    Family history of adverse reaction to anesthesia    son - Malignant Hyperthermia    GERD (gastroesophageal reflux disease)    High cholesterol    Hyperlipidemia    Hypertension    Tubular adenoma of colon    Wears contact lenses    Past Surgical History:  Procedure Laterality Date   CESAREAN SECTION     CHOLECYSTECTOMY     COLONOSCOPY WITH PROPOFOL N/A 10/21/2019   Procedure: COLONOSCOPY WITH PROPOFOL;  Surgeon: Jonathon Bellows, MD;  Location: [facility] ENDOSCOPY;  Service: Endoscopy;  Laterality: N/A;   Diabetic - oral meds COVID (+) - 08/19/19   NASAL SINUS SURGERY  2017   Dr.Byers [facility]   Current Outpatient Medications on File Prior to Visit  Medication Sig Dispense Refill   estradiol (ESTRACE) 1 MG tablet Take 1 mg by mouth daily.     hydrochlorothiazide (HYDRODIURIL) 25 MG tablet Take 0.5 tablets (12.5 mg total) by mouth daily. 90 tablet 3   metFORMIN (GLUCOPHAGE) 500 MG tablet TAKE 1 TABLET BY MOUTH TWICE (2) DAILY WITH A MEAL 180 tablet 1   montelukast (SINGULAIR) 10 MG tablet TAKE 1 TABLET BY MOUTH EVERY NIGHT AT BEDTIME 30 tablet 5   potassium chloride SA (KLOR-CON M) 20 MEQ tablet TAKE 1 TABLET BY MOUTH ONCE A DAY 90 tablet 3   progesterone (PROMETRIUM) 100 MG capsule Take 100 mg by mouth daily.     rosuvastatin (CRESTOR) 20 MG tablet TAKE 1 TABLET BY MOUTH ONCE DAILY 90 tablet 3   Vitamin D, Cholecalciferol, 10 MCG (400 UNIT) TABS      Vitamin D, Ergocalciferol, (DRISDOL) 1.25 MG (50000 UNIT) CAPS capsule Take 1 capsule (50,000 Units total) by mouth every 7 (seven) days. 8 capsule 0   No current facility-administered medications on file prior to visit.   No Known Allergies Social History   Socioeconomic History   Marital status: Married    Spouse name: Not on file  Number of children: Not on file   Years of education: Not on file   Highest education level: Not on file  Occupational History   Not on file  Tobacco Use   Smoking status: Never   Smokeless tobacco: Never  Vaping Use   Vaping Use: Never used  Substance and Sexual Activity   Alcohol use: No   Drug use: Never   Sexual activity: Yes    Comment: divorced  Other Topics Concern   Not on file  Social History Narrative   ** Merged History Encounter **       Social Determinants of Health   Financial Resource Strain: Not on file  Food Insecurity: Not on file  Transportation Needs: Not on file  Physical Activity: Not on file  Stress: Not on file  Social Connections: Not on file  Intimate Partner  Violence: Not on file      Review of Systems  Musculoskeletal:  Positive for neck pain.  All other systems reviewed and are negative.     Objective:   Physical Exam Vitals reviewed.  Constitutional:      Appearance: Normal appearance.  Cardiovascular:     Rate and Rhythm: Normal rate and regular rhythm.     Heart sounds: Normal heart sounds.  Pulmonary:     Effort: Pulmonary effort is normal.     Breath sounds: Normal breath sounds.  Musculoskeletal:     Left shoulder: Tenderness present. Decreased range of motion. Decreased strength.     Cervical back: Tenderness present. No spasms. Decreased range of motion.  Neurological:     Mental Status: She is alert.          Assessment & Plan:  Acute pain of left shoulder  Cervical spondylosis Certainly the pain in her neck is due to the cervical spondylosis.  I believe some of the left arm pain could also be due to cervical radiculopathy.  However her physical exam today seems more consistent with subacromial bursitis and impingement syndrome.  Therefore we will perform a cortisone injection in the subacromial space.  Using sterile technique I injected 2 cc of lidocaine, 2 cc of Marcaine, and 2 cc of 40 mg/mL Kenalog into the subacromial space.  The patient tolerated the procedure well without complication.  Reassess in 1 to 2 weeks.  If the pain radiating from her neck into her shoulder is no better, I would next proceed with an MRI of the cervical spine.  However the majority of the pain in the shoulder has dissipated, I would blame the pain in her left arm: Bursitis in the shoulder.  The stiffness and pain in her neck is not severe enough to warrant surgery if the pain in the shoulder improves.

## 2021-11-01 ENCOUNTER — Other Ambulatory Visit: Payer: Self-pay

## 2021-11-01 ENCOUNTER — Ambulatory Visit (INDEPENDENT_AMBULATORY_CARE_PROVIDER_SITE_OTHER): Payer: BC Managed Care – PPO | Admitting: Family Medicine

## 2021-11-01 ENCOUNTER — Encounter: Payer: Self-pay | Admitting: Family Medicine

## 2021-11-01 VITALS — BP 118/78 | HR 75 | Temp 97.2°F | Resp 18 | Ht 62.0 in | Wt 167.0 lb

## 2021-11-01 DIAGNOSIS — E118 Type 2 diabetes mellitus with unspecified complications: Secondary | ICD-10-CM

## 2021-11-01 NOTE — Progress Notes (Signed)
? ?Subjective:  ? ? Patient ID: Stephanie [name], female    DOB: 03-06-1966, 56 y.o.   MRN: [id number] ? ?Arm Pain  ? ?Neck Pain  ? ?10/05/21 ?IMPRESSION:  ?1.  Normal noncontrast CT appearance of the brain.  ?2. No acute fracture or listhesis identified in the cervical spine.  ?Ligamentous injury is not excluded.  ?3. Chronic disc and endplate degeneration at C4-C5 and C5-C6  ?resulting in mild to moderate spinal stenosis and up to moderate to  ?severe right C6 foraminal stenosis.  ? ?The report included above is from a CT scan of the cervical spine obtained in 2015.  Patient states that over the last several months she has developed worsening pain in her neck.  It hurts at night and keeps her awake.  She also has pain radiating down her left arm.  The pain is primarily in her left shoulder and subacromial space.  She has pain with abduction greater than 90 degrees.  She has pain with internal rotation.  She has pain with empty can testing.  She has pain with Hawkins maneuver.  She also has some tenderness to palpation over the lateral epicondyle consistent with epicondylitis.  Is difficult to determine how much of the pain in her shoulder is cervical radiculopathy and how much is subacromial bursitis.  She also reports numbness and tingling and burning radiating into her shoulder.  At that time, my plan was: ? ?Certainly the pain in her neck is due to the cervical spondylosis.  I believe some of the left arm pain could also be due to cervical radiculopathy.  However her physical exam today seems more consistent with subacromial bursitis and impingement syndrome.  Therefore we will perform a cortisone injection in the subacromial space.  Using sterile technique I injected 2 cc of lidocaine, 2 cc of Marcaine, and 2 cc of 40 mg/mL Kenalog into the subacromial space.  The patient tolerated the procedure well without complication.  Reassess in 1 to 2 weeks.  If the pain radiating from her neck into her shoulder is no  better, I would next proceed with an MRI of the cervical spine.  However the majority of the pain in the shoulder has dissipated, I would blame the pain in her left arm on subacromial bursitis in the shoulder.  The stiffness and pain in her neck is not severe enough to warrant surgery if the pain in the shoulder improves. ? ?11/01/21 ?Patient is a very pleasant 56 year old Caucasian female here today for follow-up.  Thankfully she states the pain in her left shoulder has subsided.  She is no longer having any numbness or tingling radiating down her arm.  She is no longer having any pain in her left shoulder so it does seem that it was subacromial bursitis.  At the present time she is asymptomatic.  She is not checking her sugars frequently but when she does check it is typically around 115.  She denies any polyuria, polydipsia, blurry vision.  Her blood pressure today is outstanding at 118/78.  She denies any neuropathy in her feet and her diabetic foot exam is normal.  She is interested in assistance with weight loss and we did discuss Ozempic today ?Past Medical History:  ?Diagnosis Date  ? COVID-19 08/19/2019  ? Symptoms resolved 08/22/19  ? Diabetes mellitus type 2 in obese [facility])   ? Family history of adverse reaction to anesthesia   ? son - Malignant Hyperthermia   ? GERD (gastroesophageal reflux disease)   ?  High cholesterol   ? Hyperlipidemia   ? Hypertension   ? Tubular adenoma of colon   ? Wears contact lenses   ? ?Past Surgical History:  ?Procedure Laterality Date  ? CESAREAN SECTION    ? CHOLECYSTECTOMY    ? COLONOSCOPY WITH PROPOFOL N/A 10/21/2019  ? Procedure: COLONOSCOPY WITH PROPOFOL;  Surgeon: Jonathon Bellows, MD;  Location: [facility] ENDOSCOPY;  Service: Endoscopy;  Laterality: N/A;  Diabetic - oral meds ?COVID (+) - 08/19/19  ? NASAL SINUS SURGERY  2017  ? Dr.Byers [facility]  ? ?Current Outpatient Medications on File Prior to Visit  ?Medication Sig Dispense Refill  ? estradiol (ESTRACE) 1 MG tablet Take 1 mg by  mouth daily.    ? hydrochlorothiazide (HYDRODIURIL) 25 MG tablet Take 0.5 tablets (12.5 mg total) by mouth daily. 90 tablet 3  ? metFORMIN (GLUCOPHAGE) 500 MG tablet TAKE 1 TABLET BY MOUTH TWICE (2) DAILY WITH A MEAL 180 tablet 1  ? montelukast (SINGULAIR) 10 MG tablet TAKE 1 TABLET BY MOUTH EVERY NIGHT AT BEDTIME 30 tablet 5  ? potassium chloride SA (KLOR-CON M) 20 MEQ tablet TAKE 1 TABLET BY MOUTH ONCE A DAY 90 tablet 3  ? progesterone (PROMETRIUM) 100 MG capsule Take 100 mg by mouth daily.    ? rosuvastatin (CRESTOR) 20 MG tablet TAKE 1 TABLET BY MOUTH ONCE DAILY 90 tablet 3  ? Vitamin D, Cholecalciferol, 10 MCG (400 UNIT) TABS     ? Vitamin D, Ergocalciferol, (DRISDOL) 1.25 MG (50000 UNIT) CAPS capsule Take 1 capsule (50,000 Units total) by mouth every 7 (seven) days. 8 capsule 0  ? ?No current facility-administered medications on file prior to visit.  ? ?No Known Allergies ?Social History  ? ?Socioeconomic History  ? Marital status: Married  ?  Spouse name: Not on file  ? Number of children: Not on file  ? Years of education: Not on file  ? Highest education level: Not on file  ?Occupational History  ? Not on file  ?Tobacco Use  ? Smoking status: Never  ? Smokeless tobacco: Never  ?Vaping Use  ? Vaping Use: Never used  ?Substance and Sexual Activity  ? Alcohol use: No  ? Drug use: Never  ? Sexual activity: Yes  ?  Comment: divorced  ?Other Topics Concern  ? Not on file  ?Social History Narrative  ? ** Merged History Encounter **  ?    ? ?Social Determinants of Health  ? ?Financial Resource Strain: Not on file  ?Food Insecurity: Not on file  ?Transportation Needs: Not on file  ?Physical Activity: Not on file  ?Stress: Not on file  ?Social Connections: Not on file  ?Intimate Partner Violence: Not on file  ? ? ? ? ?Review of Systems  ?Musculoskeletal:  Positive for neck pain.  ?All other systems reviewed and are negative. ? ?   ?Objective:  ? Physical Exam ?Vitals reviewed.  ?Constitutional:   ?   Appearance:  Normal appearance.  ?Neck:  ?   Vascular: No carotid bruit.  ?Cardiovascular:  ?   Rate and Rhythm: Normal rate and regular rhythm.  ?   Pulses: Normal pulses.  ?   Heart sounds: Normal heart sounds. No murmur heard. ?  No friction rub. No gallop.  ?Pulmonary:  ?   Effort: Pulmonary effort is normal. No respiratory distress.  ?   Breath sounds: Normal breath sounds. No wheezing, rhonchi or rales.  ?Chest:  ?   Chest wall: No tenderness.  ?Abdominal:  ?  General: Abdomen is flat. Bowel sounds are normal.  ?   Palpations: Abdomen is soft.  ?Musculoskeletal:  ?   Right lower leg: No edema.  ?   Left lower leg: No edema.  ?Neurological:  ?   Mental Status: She is alert.  ? ? ? ? ? ?   ?Assessment & Plan:  ? ?Controlled type 2 diabetes mellitus with complication, without long-term current use of insulin ([place]) - Plan: CBC with Differential/Platelet, Lipid panel, Microalbumin, urine, COMPLETE METABOLIC PANEL WITH GFR, Hemoglobin A1c ?I will check a CBC, CMP, lipid panel, urine microalbumin, and an A1c.  Her blood pressure is excellent.  I would like to see her LDL cholesterol less than 100.  I would like to see her A1c less than 6.5.  If A1c is less than 6.5, I plan to switch metformin to Ozempic not only to help manage her sugars but also to help facilitate weight loss. ?

## 2021-11-02 ENCOUNTER — Encounter: Payer: Self-pay | Admitting: Family Medicine

## 2021-11-02 LAB — CBC WITH DIFFERENTIAL/PLATELET
Absolute Monocytes: 352 cells/uL (ref 200–950)
Basophils Absolute: 26 cells/uL (ref 0–200)
Basophils Relative: 0.3 %
Eosinophils Absolute: 238 cells/uL (ref 15–500)
Eosinophils Relative: 2.7 %
HCT: 41.6 % (ref 35.0–45.0)
Hemoglobin: 13.6 g/dL (ref 11.7–15.5)
Lymphs Abs: 3872 cells/uL (ref 850–3900)
MCH: 29.9 pg (ref 27.0–33.0)
MCHC: 32.7 g/dL (ref 32.0–36.0)
MCV: 91.4 fL (ref 80.0–100.0)
MPV: 10.8 fL (ref 7.5–12.5)
Monocytes Relative: 4 %
Neutro Abs: 4312 cells/uL (ref 1500–7800)
Neutrophils Relative %: 49 %
Platelets: 264 10*3/uL (ref 140–400)
RBC: 4.55 10*6/uL (ref 3.80–5.10)
RDW: 13.4 % (ref 11.0–15.0)
Total Lymphocyte: 44 %
WBC: 8.8 10*3/uL (ref 3.8–10.8)

## 2021-11-02 LAB — LIPID PANEL
Cholesterol: 151 mg/dL (ref <200)
HDL: 60 mg/dL (ref >=50)
LDL Cholesterol (Calc): 73 mg/dL (calc)
Non-HDL Cholesterol (Calc): 91 mg/dL (calc) (ref <130)
Total CHOL/HDL Ratio: 2.5 (calc) (ref <5.0)
Triglycerides: 94 mg/dL (ref <150)

## 2021-11-02 LAB — HEMOGLOBIN A1C
Hgb A1c MFr Bld: 6.8 % of total Hgb — ABNORMAL HIGH (ref <5.7)
Mean Plasma Glucose: 148 mg/dL
eAG (mmol/L): 8.2 mmol/L

## 2021-11-02 LAB — COMPLETE METABOLIC PANEL WITH GFR
AG Ratio: 1.4 (calc) (ref 1.0–2.5)
ALT: 26 U/L (ref 6–29)
AST: 24 U/L (ref 10–35)
Albumin: 4.3 g/dL (ref 3.6–5.1)
Alkaline phosphatase (APISO): 54 U/L (ref 37–153)
BUN: 11 mg/dL (ref 7–25)
CO2: 29 mmol/L (ref 20–32)
Calcium: 9.3 mg/dL (ref 8.6–10.4)
Chloride: 103 mmol/L (ref 98–110)
Creat: 0.71 mg/dL (ref 0.50–1.03)
Globulin: 3 g/dL (calc) (ref 1.9–3.7)
Glucose, Bld: 117 mg/dL — ABNORMAL HIGH (ref 65–99)
Potassium: 3.7 mmol/L (ref 3.5–5.3)
Sodium: 140 mmol/L (ref 135–146)
Total Bilirubin: 0.4 mg/dL (ref 0.2–1.2)
Total Protein: 7.3 g/dL (ref 6.1–8.1)
eGFR: 100 mL/min/{1.73_m2} (ref >=60)

## 2021-11-02 LAB — MICROALBUMIN, URINE: Microalb, Ur: 0.2 mg/dL

## 2021-11-03 ENCOUNTER — Other Ambulatory Visit: Payer: Self-pay

## 2021-11-03 DIAGNOSIS — R7309 Other abnormal glucose: Secondary | ICD-10-CM

## 2021-11-03 DIAGNOSIS — R7303 Prediabetes: Secondary | ICD-10-CM

## 2021-11-03 DIAGNOSIS — R739 Hyperglycemia, unspecified: Secondary | ICD-10-CM

## 2021-11-03 MED ORDER — OZEMPIC (0.25 OR 0.5 MG/DOSE) 2 MG/1.5ML ~~LOC~~ SOPN: 0.5000 mg | PEN_INJECTOR | SUBCUTANEOUS | 1 refills | Status: DC

## 2021-11-04 ENCOUNTER — Telehealth: Payer: Self-pay | Admitting: Family Medicine

## 2021-11-04 NOTE — Telephone Encounter (Signed)
Patient called to advise pharmacy told her Ozempic will need PA.  ? ?PA already started on CMM. ?

## 2021-11-04 NOTE — Telephone Encounter (Signed)
Patient called to speak with nurse; requesting call back at your convenience. ? ?Please advise at [phone]. ?

## 2021-11-08 ENCOUNTER — Encounter: Payer: Self-pay | Admitting: Family Medicine

## 2021-11-09 NOTE — Telephone Encounter (Signed)
Rosemarie Ax (Key: [id number]) ?Rx #: [id number] ?Ozempic (0.25 or 0.5 MG/DOSE) '2MG'$ /3ML pen-injectors ?APPROVED ?Effective from 11/09/2021 through 11/08/2022. ?

## 2021-11-12 ENCOUNTER — Telehealth: Payer: Self-pay

## 2021-11-12 NOTE — Telephone Encounter (Signed)
Pt called to see if there is something else you can give her beside Ozempic due to cost? Ie metFormen ?

## 2021-11-15 ENCOUNTER — Other Ambulatory Visit: Payer: Self-pay | Admitting: Family Medicine

## 2021-11-16 NOTE — Telephone Encounter (Signed)
Called pt that she can go back to [place]. However per pt as of right now she decided to wait to see if the pharmacy could help get Ozempic  for a cheaper price. Per pt if she can get it for more affordable price, she will keep the Ozempic, if not then she will go back to Metformen as an option.  ? ?Per pt she will keep Dr. Dennard Schaumann updated through [place]. FYI: as of right not pt is not taking/started Ozempic yet, waiting on pharmacy 11/16/21 BT ?

## 2021-11-19 ENCOUNTER — Telehealth: Payer: Self-pay

## 2021-11-19 NOTE — Telephone Encounter (Signed)
Pt called in wanting to discuss a med that pt wants to pick up from pharmacy this afternoon. Please advise. ? ?Cb#: [phone] ?

## 2021-11-19 NOTE — Telephone Encounter (Signed)
Spoke with patient and she was able to obtain patient assistance with Ozempic. She states she is going to go pick up rx tomorrow. Advised patient she could also pick up Meclizine in case she has any nausea. Patient is not sure how long her patient assistance is good for, advised her to contact [employer] for more information on this. Phone number provided. She will let us know if she has any issues with the medication and side effects. Nothing further needed at this time.  ?

## 2021-11-23 ENCOUNTER — Telehealth: Payer: Self-pay

## 2021-11-23 NOTE — Telephone Encounter (Signed)
Spoke with patient and she would like to know when to restart the Metformin. She is still considering whether or not to stop the Ozempic as she was really looking forward to using it.  ? ?Please advise on Metformin, thanks! ?

## 2021-11-23 NOTE — Telephone Encounter (Signed)
Patient called to state she took her first dose of 0.'5mg'$  Ozempic on Sunday. She has had headache, nausea and some emesis yesterday. She is feeling slightly better today but was unable to go to work. She has been taking Tylenol and Meclizine (OTC). Patient is not sure what to do going forward. ? ?Please advise if patient can decrease dose to 0.'25mg'$  and if can send in Zofran for nausea/emesis. Thanks! ?

## 2021-11-24 NOTE — Telephone Encounter (Signed)
Spoke with patient and advised on restarting Metformin. She voiced understanding.  ? ?She asked what else could be done regarding weight loss. Patient advised we could place referral to [facility]. She will consider and let us know.  ? ?Nothing further needed at this time.  ? ?

## 2021-12-20 ENCOUNTER — Encounter: Payer: Self-pay | Admitting: Family Medicine

## 2022-03-12 ENCOUNTER — Encounter: Payer: Self-pay | Admitting: Family Medicine

## 2022-03-23 ENCOUNTER — Telehealth: Payer: Self-pay

## 2022-03-23 DIAGNOSIS — J0141 Acute recurrent pansinusitis: Secondary | ICD-10-CM

## 2022-03-23 MED ORDER — MONTELUKAST SODIUM 10 MG PO TABS: 10.0000 mg | ORAL_TABLET | Freq: Every day | ORAL | 5 refills | Status: DC

## 2022-03-23 MED ORDER — POTASSIUM CHLORIDE CRYS ER 20 MEQ PO TBCR: 20.0000 meq | EXTENDED_RELEASE_TABLET | Freq: Every day | ORAL | 3 refills | Status: DC

## 2022-03-23 MED ORDER — METFORMIN HCL 500 MG PO TABS: ORAL_TABLET | ORAL | 1 refills | Status: DC

## 2022-03-23 MED ORDER — HYDROCHLOROTHIAZIDE 25 MG PO TABS: 12.5000 mg | ORAL_TABLET | Freq: Every day | ORAL | 3 refills | Status: DC

## 2022-03-23 MED ORDER — ROSUVASTATIN CALCIUM 20 MG PO TABS: 20.0000 mg | ORAL_TABLET | Freq: Every day | ORAL | 3 refills | Status: DC

## 2022-03-23 NOTE — Telephone Encounter (Signed)
Pt called in stating that she does have a 6 month f/u appt coming up in Sept with pcp but will be out of her meds by that time. Pt is asking if she could get refills of these meds to last until her appt on 9/18.  hydrochlorothiazide (HYDRODIURIL) 25 MG tablet [[id number]]  metFORMIN (GLUCOPHAGE) 500 MG tablet [[id number]]  montelukast (SINGULAIR) 10 MG tablet [[id number]]  potassium chloride SA (KLOR-CON M) 20 MEQ tablet [[id number]]  rosuvastatin (CRESTOR) 20 MG tablet [[id number]]  Vitamin D, Cholecalciferol, 10 MCG   Pt would like a cb if these refills can be done, or if she may need to come in earlier for a ov.  Cb#: [phone]

## 2022-03-29 ENCOUNTER — Encounter: Payer: Self-pay | Admitting: Family Medicine

## 2022-03-29 ENCOUNTER — Other Ambulatory Visit: Payer: Self-pay | Admitting: Family Medicine

## 2022-03-30 NOTE — Telephone Encounter (Signed)
Resending as it was not sent on 03/23/2022 #90, 3 refills to pharmacy.   It was "print".   Requested Prescriptions  Pending Prescriptions Disp Refills  . hydrochlorothiazide (HYDRODIURIL) 25 MG tablet [Pharmacy Med Name: HYDROCHLOROTHIAZIDE 25 MG TAB] 90 tablet 3    Sig: TAKE 1 TABLET BY MOUTH ONCE A DAY     Cardiovascular: Diuretics - Thiazide Passed - 03/29/2022  4:23 PM      Passed - Cr in normal range and within 180 days    Creat  Date Value Ref Range Status  11/01/2021 0.71 0.50 - 1.03 mg/dL Final         Passed - K in normal range and within 180 days    Potassium  Date Value Ref Range Status  11/01/2021 3.7 3.5 - 5.3 mmol/L Final         Passed - Na in normal range and within 180 days    Sodium  Date Value Ref Range Status  11/01/2021 140 135 - 146 mmol/L Final  08/06/2016 143 134 - 144 mmol/L Final         Passed - Last BP in normal range    BP Readings from Last 1 Encounters:  11/01/21 118/78         Passed - Valid encounter within last 6 months    Recent Outpatient Visits          4 months ago Controlled type 2 diabetes mellitus with complication, without long-term current use of insulin ([place])   [place] Susy Frizzle, MD   5 months ago Acute pain of left shoulder   [place] Dennard Schaumann, Cammie Mcgee, MD   10 months ago Primary hypertension   [place] Eulogio Bear, NP   11 months ago Primary hypertension   [place] Eulogio Bear, NP   1 year ago Epigastric abdominal pain   [place] Pickard, Cammie Mcgee, MD      Future Appointments            In 1 week Pickard, Cammie Mcgee, MD [place]

## 2022-03-31 ENCOUNTER — Other Ambulatory Visit: Payer: Self-pay | Admitting: Family Medicine

## 2022-03-31 MED ORDER — HYDROCHLOROTHIAZIDE 25 MG PO TABS: 25.0000 mg | ORAL_TABLET | Freq: Every day | ORAL | 3 refills | Status: DC

## 2022-04-08 ENCOUNTER — Ambulatory Visit: Payer: BC Managed Care – PPO | Admitting: Family Medicine

## 2022-04-08 ENCOUNTER — Encounter: Payer: Self-pay | Admitting: Family Medicine

## 2022-04-08 VITALS — BP 120/62 | HR 85 | Temp 97.9°F | Ht 62.0 in | Wt 166.0 lb

## 2022-04-08 DIAGNOSIS — E118 Type 2 diabetes mellitus with unspecified complications: Secondary | ICD-10-CM

## 2022-04-08 DIAGNOSIS — E559 Vitamin D deficiency, unspecified: Secondary | ICD-10-CM

## 2022-04-08 MED ORDER — AMOXICILLIN 875 MG PO TABS: 875.0000 mg | ORAL_TABLET | Freq: Two times a day (BID) | ORAL | 0 refills | Status: AC

## 2022-04-08 NOTE — Progress Notes (Signed)
Subjective:    Patient ID: Stephanie [name], female    DOB: [birth year], 56 y.o.   MRN: [id number]  Patient is a very pleasant 56 year old Caucasian female here today for follow-up of her diabetes.  We tried her on Ozempic at 0.5 mg subcu weekly.  She was only able to tolerate 1 dose.  She states that it caused severe nausea to the extent that she was not able to go to work.  She is not interested in trying another member of that category of medication.  She is tolerating the metformin without difficulty.  She denies any severe diarrhea..  Her blood pressure today is outstanding at 120/62.  She does have a history of vitamin D deficiency.  Her vitamin D level was low at 11 in September of last year.  We have not checked it in the last year.  She would like to repeat that today.  She also believes that she has a sinus infection.  She has tenderness and pain in her right maxillary sinus.  Is been there for more than a week.  She is been trying Flonase and Claritin without any relief.  She denies any fevers or chills. Past Medical History:  Diagnosis Date   COVID-19 08/19/2019   Symptoms resolved 08/22/19   Diabetes mellitus type 2 in obese [facility])    Family history of adverse reaction to anesthesia    son - Malignant Hyperthermia    GERD (gastroesophageal reflux disease)    High cholesterol    Hyperlipidemia    Hypertension    Tubular adenoma of colon    Wears contact lenses    Past Surgical History:  Procedure Laterality Date   CESAREAN SECTION     CHOLECYSTECTOMY     COLONOSCOPY WITH PROPOFOL N/A 10/21/2019   Procedure: COLONOSCOPY WITH PROPOFOL;  Surgeon: Jonathon Bellows, MD;  Location: [facility] ENDOSCOPY;  Service: Endoscopy;  Laterality: N/A;  Diabetic - oral meds COVID (+) - 08/19/19   NASAL SINUS SURGERY  2017   Dr.Byers [facility]   Current Outpatient Medications on File Prior to Visit  Medication Sig Dispense Refill   hydrochlorothiazide (HYDRODIURIL) 25 MG tablet Take 1 tablet (25 mg total) by  mouth daily. 90 tablet 3   metFORMIN (GLUCOPHAGE) 500 MG tablet TAKE 1 TABLET BY MOUTH TWICE (2) DAILY WITH A MEAL 180 tablet 1   montelukast (SINGULAIR) 10 MG tablet Take 1 tablet (10 mg total) by mouth at bedtime. 30 tablet 5   OZEMPIC, 0.25 OR 0.5 MG/DOSE, 2 MG/3ML SOPN Inject 0.5 mg into the skin once a week.     potassium chloride SA (KLOR-CON M) 20 MEQ tablet Take 1 tablet (20 mEq total) by mouth daily. 90 tablet 3   rosuvastatin (CRESTOR) 20 MG tablet Take 1 tablet (20 mg total) by mouth daily. 90 tablet 3   Vitamin D, Cholecalciferol, 10 MCG (400 UNIT) TABS      No current facility-administered medications on file prior to visit.   No Known Allergies Social History   Socioeconomic History   Marital status: Married    Spouse name: Not on file   Number of children: Not on file   Years of education: Not on file   Highest education level: Not on file  Occupational History   Not on file  Tobacco Use   Smoking status: Never   Smokeless tobacco: Never  Vaping Use   Vaping Use: Never used  Substance and Sexual Activity   Alcohol use: No  Drug use: Never   Sexual activity: Yes    Comment: divorced  Other Topics Concern   Not on file  Social History Narrative   ** Merged History Encounter **       Social Determinants of Health   Financial Resource Strain: Not on file  Food Insecurity: Not on file  Transportation Needs: Not on file  Physical Activity: Not on file  Stress: Not on file  Social Connections: Not on file  Intimate Partner Violence: Not on file      Review of Systems  Musculoskeletal:  Positive for neck pain.  All other systems reviewed and are negative.      Objective:   Physical Exam Vitals reviewed.  Constitutional:      Appearance: Normal appearance.  HENT:     Nose:     Right Turbinates: Not enlarged.     Left Turbinates: Not enlarged.     Right Sinus: Maxillary sinus tenderness and frontal sinus tenderness present.  Neck:     Vascular:  No carotid bruit.  Cardiovascular:     Rate and Rhythm: Normal rate and regular rhythm.     Pulses: Normal pulses.     Heart sounds: Normal heart sounds. No murmur heard.    No friction rub. No gallop.  Pulmonary:     Effort: Pulmonary effort is normal. No respiratory distress.     Breath sounds: Normal breath sounds. No wheezing, rhonchi or rales.  Chest:     Chest wall: No tenderness.  Abdominal:     General: Abdomen is flat. Bowel sounds are normal.     Palpations: Abdomen is soft.  Musculoskeletal:     Right lower leg: No edema.     Left lower leg: No edema.  Neurological:     Mental Status: She is alert.           Assessment & Plan:   Controlled type 2 diabetes mellitus with complication, without long-term current use of insulin ([facility]) - Plan: Lipid panel, Microalbumin, urine, COMPLETE METABOLIC PANEL WITH GFR, Hemoglobin A1c  Vitamin D deficiency - Plan: VITAMIN D 25 Hydroxy (Vit-D Deficiency, Fractures) Very happy with her blood pressure.  We will check a hemoglobin A1c.  Goal A1c is less than 6.5.  I mention to her trying Trulicity versus Mounjaro to try to help with weight loss but the patient is not interested given her severe reaction to the Ozempic.  She states that she wants to try exercise instead which I believe is totally reasonable.  I will also check a vitamin D level and if still less than 30 would recommend 50,000 units of vitamin D weekly.  If her vitamin D level is normal I would recommend 1000 a day.  I do believe she may have a sinus infection in her right maxillary sinus I will start her on amoxicillin 875 mg twice daily in addition to her Flonase.

## 2022-04-09 LAB — COMPLETE METABOLIC PANEL WITH GFR
AG Ratio: 1.6 (calc) (ref 1.0–2.5)
ALT: 36 U/L — ABNORMAL HIGH (ref 6–29)
AST: 39 U/L — ABNORMAL HIGH (ref 10–35)
Albumin: 4.7 g/dL (ref 3.6–5.1)
Alkaline phosphatase (APISO): 63 U/L (ref 37–153)
BUN: 7 mg/dL (ref 7–25)
CO2: 26 mmol/L (ref 20–32)
Calcium: 9.6 mg/dL (ref 8.6–10.4)
Chloride: 100 mmol/L (ref 98–110)
Creat: 0.7 mg/dL (ref 0.50–1.03)
Globulin: 3 g/dL (calc) (ref 1.9–3.7)
Glucose, Bld: 132 mg/dL — ABNORMAL HIGH (ref 65–99)
Potassium: 4 mmol/L (ref 3.5–5.3)
Sodium: 140 mmol/L (ref 135–146)
Total Bilirubin: 0.3 mg/dL (ref 0.2–1.2)
Total Protein: 7.7 g/dL (ref 6.1–8.1)
eGFR: 101 mL/min/{1.73_m2} (ref >=60)

## 2022-04-09 LAB — LIPID PANEL
Cholesterol: 143 mg/dL (ref <200)
HDL: 46 mg/dL — ABNORMAL LOW (ref >=50)
LDL Cholesterol (Calc): 79 mg/dL (calc)
Non-HDL Cholesterol (Calc): 97 mg/dL (calc) (ref <130)
Total CHOL/HDL Ratio: 3.1 (calc) (ref <5.0)
Triglycerides: 99 mg/dL (ref <150)

## 2022-04-09 LAB — MICROALBUMIN, URINE: Microalb, Ur: 1 mg/dL

## 2022-04-09 LAB — VITAMIN D 25 HYDROXY (VIT D DEFICIENCY, FRACTURES): Vit D, 25-Hydroxy: 32 ng/mL (ref 30–100)

## 2022-04-09 LAB — HEMOGLOBIN A1C
Hgb A1c MFr Bld: 6.8 % of total Hgb — ABNORMAL HIGH (ref <5.7)
Mean Plasma Glucose: 148 mg/dL
eAG (mmol/L): 8.2 mmol/L

## 2022-05-09 ENCOUNTER — Ambulatory Visit: Payer: Self-pay | Admitting: Family Medicine

## 2022-06-01 ENCOUNTER — Encounter: Payer: Self-pay | Admitting: Family Medicine

## 2022-06-02 MED ORDER — VITAMIN D3 25 MCG (1000 UT) PO CAPS: 1000.0000 [IU] | ORAL_CAPSULE | Freq: Every day | ORAL | 3 refills | Status: DC

## 2022-06-24 LAB — HM DIABETES EYE EXAM

## 2022-07-25 ENCOUNTER — Other Ambulatory Visit: Payer: Self-pay | Admitting: Family Medicine

## 2022-07-25 DIAGNOSIS — Z1231 Encounter for screening mammogram for malignant neoplasm of breast: Secondary | ICD-10-CM

## 2022-07-26 ENCOUNTER — Encounter: Payer: Self-pay | Admitting: Family Medicine

## 2022-07-26 ENCOUNTER — Ambulatory Visit: Payer: Managed Care, Other (non HMO) | Admitting: Family Medicine

## 2022-07-26 VITALS — BP 136/86 | HR 94 | Ht 62.0 in | Wt 164.0 lb

## 2022-07-26 DIAGNOSIS — J0100 Acute maxillary sinusitis, unspecified: Secondary | ICD-10-CM

## 2022-07-26 MED ORDER — CEFDINIR 300 MG PO CAPS: 300.0000 mg | ORAL_CAPSULE | Freq: Two times a day (BID) | ORAL | 0 refills | Status: DC

## 2022-07-26 NOTE — Progress Notes (Signed)
Subjective:    Patient ID: Stephanie [name], female    DOB: 1965-09-21, 56 y.o.   MRN: [id number]  Illness  Sinus Problem  Patient states symptoms began about 10 days ago.  Symptoms include severe head congestion, bilateral maxillary sinus pain, severe headache and postnasal drip and rhinorrhea.  However she states that she cannot "blow her nose" due to her congestion.  She also reports burning in her chest and coughing.  She has taken several COVID test which are negative Past Medical History:  Diagnosis Date   COVID-19 08/19/2019   Symptoms resolved 08/22/19   Diabetes mellitus type 2 in obese [facility])    Family history of adverse reaction to anesthesia    son - Malignant Hyperthermia    GERD (gastroesophageal reflux disease)    High cholesterol    Hyperlipidemia    Hypertension    Tubular adenoma of colon    Wears contact lenses    Past Surgical History:  Procedure Laterality Date   CESAREAN SECTION     CHOLECYSTECTOMY     COLONOSCOPY WITH PROPOFOL N/A 10/21/2019   Procedure: COLONOSCOPY WITH PROPOFOL;  Surgeon: Jonathon Bellows, MD;  Location: [facility] ENDOSCOPY;  Service: Endoscopy;  Laterality: N/A;  Diabetic - oral meds COVID (+) - 08/19/19   NASAL SINUS SURGERY  2017   Dr.Byers [facility]   Current Outpatient Medications on File Prior to Visit  Medication Sig Dispense Refill   Cholecalciferol (VITAMIN D3) 25 MCG (1000 UT) CAPS Take 1 capsule (1,000 Units total) by mouth daily. 90 capsule 3   hydrochlorothiazide (HYDRODIURIL) 25 MG tablet Take 1 tablet (25 mg total) by mouth daily. 90 tablet 3   metFORMIN (GLUCOPHAGE) 500 MG tablet TAKE 1 TABLET BY MOUTH TWICE (2) DAILY WITH A MEAL 180 tablet 1   montelukast (SINGULAIR) 10 MG tablet Take 1 tablet (10 mg total) by mouth at bedtime. 30 tablet 5   potassium chloride SA (KLOR-CON M) 20 MEQ tablet Take 1 tablet (20 mEq total) by mouth daily. 90 tablet 3   rosuvastatin (CRESTOR) 20 MG tablet Take 1 tablet (20 mg total) by mouth daily. 90  tablet 3   No current facility-administered medications on file prior to visit.    No Known Allergies Social History   Socioeconomic History   Marital status: Married    Spouse name: Not on file   Number of children: Not on file   Years of education: Not on file   Highest education level: Not on file  Occupational History   Not on file  Tobacco Use   Smoking status: Never   Smokeless tobacco: Never  Vaping Use   Vaping Use: Never used  Substance and Sexual Activity   Alcohol use: No   Drug use: Never   Sexual activity: Yes    Comment: divorced  Other Topics Concern   Not on file  Social History Narrative   ** Merged History Encounter **       Social Determinants of Health   Financial Resource Strain: Not on file  Food Insecurity: Not on file  Transportation Needs: Not on file  Physical Activity: Not on file  Stress: Not on file  Social Connections: Not on file  Intimate Partner Violence: Not on file     Review of Systems  All other systems reviewed and are negative.      Objective:   Physical Exam Vitals reviewed.  Constitutional:      General: She is not in acute distress.  Appearance: She is well-developed. She is not diaphoretic.  HENT:     Right Ear: Tympanic membrane and ear canal normal.     Left Ear: Tympanic membrane and ear canal normal.     Nose: Congestion and rhinorrhea present.     Right Sinus: Maxillary sinus tenderness present.     Left Sinus: Maxillary sinus tenderness present.  Eyes:     Conjunctiva/sclera: Conjunctivae normal.  Cardiovascular:     Rate and Rhythm: Normal rate and regular rhythm.     Pulses: Normal pulses.     Heart sounds: Normal heart sounds. No murmur heard. Pulmonary:     Effort: Pulmonary effort is normal. No respiratory distress.     Breath sounds: No stridor. Wheezing present. No rhonchi or rales.  Abdominal:     General: Abdomen is flat. Bowel sounds are normal.     Palpations: Abdomen is soft.   Musculoskeletal:     Cervical back: No rigidity.  Lymphadenopathy:     Cervical: No cervical adenopathy.  Neurological:     General: No focal deficit present.     Mental Status: She is oriented to person, place, and time.     Cranial Nerves: No cranial nerve deficit.           Assessment & Plan:  Acute non-recurrent maxillary sinusitis Symptoms sound consistent with bacterial sinusitis.  Begin Omnicef 300 mg twice daily for 10 days and Sudafed in addition to nasal saline rinses.

## 2022-09-08 ENCOUNTER — Encounter: Payer: Self-pay | Admitting: Family Medicine

## 2022-10-03 ENCOUNTER — Encounter: Payer: Self-pay | Admitting: Family Medicine

## 2022-10-03 ENCOUNTER — Ambulatory Visit (INDEPENDENT_AMBULATORY_CARE_PROVIDER_SITE_OTHER): Payer: Managed Care, Other (non HMO) | Admitting: Family Medicine

## 2022-10-03 VITALS — BP 126/74 | HR 83 | Temp 98.6°F | Ht 64.0 in | Wt 165.0 lb

## 2022-10-03 DIAGNOSIS — R1011 Right upper quadrant pain: Secondary | ICD-10-CM | POA: Diagnosis not present

## 2022-10-03 DIAGNOSIS — E118 Type 2 diabetes mellitus with unspecified complications: Secondary | ICD-10-CM | POA: Diagnosis not present

## 2022-10-03 NOTE — Progress Notes (Signed)
Subjective:    Patient ID: Stephanie [name], female    DOB: Feb 14, 1966, 57 y.o.   MRN: [id number]  Patient is a very pleasant 57 year old Caucasian female who is here today for follow-up of her diabetes.  She is also been having pain and tenderness in the right upper quadrant.  She has a history of a cholecystectomy.  She denies any nausea or vomiting.  She denies any melena or hematochezia.  She is tender in the right upper quadrant however there is no palpable mass or abnormality.  There is no rash or skin changes.  There is no pain over the rib.  She is very concerned about her fatty liver disease and would like to do a right upper quadrant ultrasound to reevaluate this.  She also wants to discuss options for weight loss.  Her blood pressure is well-controlled.  However I want to avoid phentermine given her hypertension and diabetes.  She had a lot of trouble tolerating Ozempic due to nausea.  She is hesitant to try another GLP-1 agonist Past Medical History:  Diagnosis Date   COVID-19 08/19/2019   Symptoms resolved 08/22/19   Diabetes mellitus type 2 in obese [facility])    Family history of adverse reaction to anesthesia    son - Malignant Hyperthermia    GERD (gastroesophageal reflux disease)    High cholesterol    Hyperlipidemia    Hypertension    Tubular adenoma of colon    Wears contact lenses    Past Surgical History:  Procedure Laterality Date   CESAREAN SECTION     CHOLECYSTECTOMY     COLONOSCOPY WITH PROPOFOL N/A 10/21/2019   Procedure: COLONOSCOPY WITH PROPOFOL;  Surgeon: Jonathon Bellows, MD;  Location: [facility] ENDOSCOPY;  Service: Endoscopy;  Laterality: N/A;  Diabetic - oral meds COVID (+) - 08/19/19   NASAL SINUS SURGERY  2017   Dr.Byers [facility]   Current Outpatient Medications on File Prior to Visit  Medication Sig Dispense Refill   Cholecalciferol (VITAMIN D3) 25 MCG (1000 UT) CAPS Take 1 capsule (1,000 Units total) by mouth daily. 90 capsule 3   hydrochlorothiazide (HYDRODIURIL)  25 MG tablet Take 1 tablet (25 mg total) by mouth daily. 90 tablet 3   metFORMIN (GLUCOPHAGE) 500 MG tablet TAKE 1 TABLET BY MOUTH TWICE (2) DAILY WITH A MEAL 180 tablet 1   montelukast (SINGULAIR) 10 MG tablet Take 1 tablet (10 mg total) by mouth at bedtime. 30 tablet 5   potassium chloride SA (KLOR-CON M) 20 MEQ tablet Take 1 tablet (20 mEq total) by mouth daily. 90 tablet 3   rosuvastatin (CRESTOR) 20 MG tablet Take 1 tablet (20 mg total) by mouth daily. 90 tablet 3   No current facility-administered medications on file prior to visit.   No Known Allergies Social History   Socioeconomic History   Marital status: Married    Spouse name: Not on file   Number of children: Not on file   Years of education: Not on file   Highest education level: Not on file  Occupational History   Not on file  Tobacco Use   Smoking status: Never   Smokeless tobacco: Never  Vaping Use   Vaping Use: Never used  Substance and Sexual Activity   Alcohol use: No   Drug use: Never   Sexual activity: Yes    Comment: divorced  Other Topics Concern   Not on file  Social History Narrative   ** Merged History Encounter **  Social Determinants of Health   Financial Resource Strain: Not on file  Food Insecurity: Not on file  Transportation Needs: Not on file  Physical Activity: Not on file  Stress: Not on file  Social Connections: Not on file  Intimate Partner Violence: Not on file      Review of Systems  Musculoskeletal:  Positive for neck pain.  All other systems reviewed and are negative.      Objective:   Physical Exam Vitals reviewed.  Constitutional:      Appearance: Normal appearance.  Neck:     Vascular: No carotid bruit.  Cardiovascular:     Rate and Rhythm: Normal rate and regular rhythm.     Pulses: Normal pulses.     Heart sounds: Normal heart sounds. No murmur heard.    No friction rub. No gallop.  Pulmonary:     Effort: Pulmonary effort is normal. No respiratory  distress.     Breath sounds: Normal breath sounds. No wheezing, rhonchi or rales.  Chest:     Chest wall: No tenderness.  Abdominal:     General: Abdomen is flat. Bowel sounds are normal.     Palpations: Abdomen is soft.     Tenderness: There is abdominal tenderness in the right upper quadrant.    Musculoskeletal:     Right lower leg: No edema.     Left lower leg: No edema.  Neurological:     Mental Status: She is alert.           Assessment & Plan:   RUQ pain - Plan: US Abdomen Limited RUQ (LIVER/GB)  Controlled type 2 diabetes mellitus with complication, without long-term current use of insulin ([facility]) - Plan: CBC with Differential/Platelet, COMPLETE METABOLIC PANEL WITH GFR, Hemoglobin A1c, Protein / Creatinine Ratio, Urine, Lipid panel I am very happy with her blood pressure.  I believe the pain in her right upper quadrant is likely muscular however I will check a right upper quadrant ultrasound given her history of fatty liver disease.  I will also check an A1c.  I would recommend trying vICTOZA both for weight loss and to help manage her diabetes and uptitrate as tolerated.  Perhaps once she is tolerating the vICTOZA  we can transition to Trulicity or Mounjaro.  I emphasized that weight loss using a GLP-1 agonist would have the best impact on her fatty liver disease as well

## 2022-10-04 ENCOUNTER — Ambulatory Visit: Payer: BC Managed Care – PPO | Admitting: Family Medicine

## 2022-10-07 ENCOUNTER — Ambulatory Visit: Payer: BC Managed Care – PPO | Admitting: Family Medicine

## 2022-10-07 ENCOUNTER — Ambulatory Visit
Admission: RE | Admit: 2022-10-07 | Discharge: 2022-10-07 | Disposition: A | Discharge status: Home / Self Care | Payer: Managed Care, Other (non HMO) | Source: Ambulatory Visit | Attending: Family Medicine | Admitting: Family Medicine

## 2022-10-07 DIAGNOSIS — R1011 Right upper quadrant pain: Secondary | ICD-10-CM

## 2022-10-07 DIAGNOSIS — Z1231 Encounter for screening mammogram for malignant neoplasm of breast: Secondary | ICD-10-CM

## 2022-10-10 ENCOUNTER — Other Ambulatory Visit: Payer: Managed Care, Other (non HMO)

## 2022-10-11 ENCOUNTER — Other Ambulatory Visit: Payer: Self-pay

## 2022-10-11 DIAGNOSIS — E118 Type 2 diabetes mellitus with unspecified complications: Secondary | ICD-10-CM

## 2022-10-11 LAB — CBC WITH DIFFERENTIAL/PLATELET
Absolute Monocytes: 392 cells/uL (ref 200–950)
Basophils Absolute: 30 cells/uL (ref 0–200)
Basophils Relative: 0.4 %
Eosinophils Absolute: 422 cells/uL (ref 15–500)
Eosinophils Relative: 5.7 %
HCT: 38.4 % (ref 35.0–45.0)
Hemoglobin: 12.8 g/dL (ref 11.7–15.5)
Lymphs Abs: 2723 cells/uL (ref 850–3900)
MCH: 30.2 pg (ref 27.0–33.0)
MCHC: 33.3 g/dL (ref 32.0–36.0)
MCV: 90.6 fL (ref 80.0–100.0)
MPV: 10.6 fL (ref 7.5–12.5)
Monocytes Relative: 5.3 %
Neutro Abs: 3833 cells/uL (ref 1500–7800)
Neutrophils Relative %: 51.8 %
Platelets: 251 10*3/uL (ref 140–400)
RBC: 4.24 10*6/uL (ref 3.80–5.10)
RDW: 13.2 % (ref 11.0–15.0)
Total Lymphocyte: 36.8 %
WBC: 7.4 10*3/uL (ref 3.8–10.8)

## 2022-10-11 LAB — COMPLETE METABOLIC PANEL WITH GFR
AG Ratio: 1.4 (calc) (ref 1.0–2.5)
ALT: 19 U/L (ref 6–29)
AST: 26 U/L (ref 10–35)
Albumin: 4.4 g/dL (ref 3.6–5.1)
Alkaline phosphatase (APISO): 62 U/L (ref 37–153)
BUN: 7 mg/dL (ref 7–25)
CO2: 26 mmol/L (ref 20–32)
Calcium: 9.4 mg/dL (ref 8.6–10.4)
Chloride: 103 mmol/L (ref 98–110)
Creat: 0.64 mg/dL (ref 0.50–1.03)
Globulin: 3.2 g/dL (calc) (ref 1.9–3.7)
Glucose, Bld: 128 mg/dL — ABNORMAL HIGH (ref 65–99)
Potassium: 4 mmol/L (ref 3.5–5.3)
Sodium: 143 mmol/L (ref 135–146)
Total Bilirubin: 0.4 mg/dL (ref 0.2–1.2)
Total Protein: 7.6 g/dL (ref 6.1–8.1)
eGFR: 104 mL/min/{1.73_m2} (ref >=60)

## 2022-10-11 LAB — LIPID PANEL
Cholesterol: 151 mg/dL (ref <200)
HDL: 39 mg/dL — ABNORMAL LOW (ref >=50)
LDL Cholesterol (Calc): 87 mg/dL (calc)
Non-HDL Cholesterol (Calc): 112 mg/dL (calc) (ref <130)
Total CHOL/HDL Ratio: 3.9 (calc) (ref <5.0)
Triglycerides: 151 mg/dL — ABNORMAL HIGH (ref <150)

## 2022-10-11 LAB — HEMOGLOBIN A1C
Hgb A1c MFr Bld: 7 % of total Hgb — ABNORMAL HIGH (ref <5.7)
Mean Plasma Glucose: 154 mg/dL
eAG (mmol/L): 8.5 mmol/L

## 2022-10-11 LAB — PROTEIN / CREATININE RATIO, URINE
Creatinine, Urine: 227 mg/dL (ref 20–275)
Protein/Creat Ratio: 101 mg/g creat (ref 24–184)
Protein/Creatinine Ratio: 0.101 mg/mg creat (ref 0.024–0.184)
Total Protein, Urine: 23 mg/dL (ref 5–24)

## 2022-10-26 ENCOUNTER — Other Ambulatory Visit: Payer: Managed Care, Other (non HMO)

## 2022-11-04 ENCOUNTER — Telehealth: Payer: Self-pay

## 2022-11-04 NOTE — Telephone Encounter (Signed)
Spoke w/pt today, re PA for Ozempic. Per pt as of right now, pt is not doing the Ozempic, per pt going to try dieting first.   Advise pt that, in that case I will going to not sent this PA to be processed, until she wants to start Ozempic again. For pt to go to pharmacy to restart a new PA for ozempic when she is ready.   Pt voiced understanding and nothing further

## 2022-11-17 ENCOUNTER — Ambulatory Visit: Payer: Managed Care, Other (non HMO) | Admitting: Family Medicine

## 2022-11-17 ENCOUNTER — Encounter: Payer: Self-pay | Admitting: Family Medicine

## 2022-11-17 VITALS — BP 126/82 | HR 78 | Temp 97.8°F | Ht 64.0 in | Wt 157.0 lb

## 2022-11-17 DIAGNOSIS — J0100 Acute maxillary sinusitis, unspecified: Secondary | ICD-10-CM

## 2022-11-17 MED ORDER — CEFDINIR 300 MG PO CAPS: 300.0000 mg | ORAL_CAPSULE | Freq: Two times a day (BID) | ORAL | 0 refills | Status: DC

## 2022-11-17 MED ORDER — FLUCONAZOLE 150 MG PO TABS: 150.0000 mg | ORAL_TABLET | Freq: Once | ORAL | 0 refills | Status: AC

## 2022-11-17 NOTE — Progress Notes (Signed)
Subjective:    Patient ID: Stephanie [name], female    DOB: November 04, 1965, 57 y.o.   MRN: [id number]  Patient reports pain in her right ear.  She states that she has been battling allergies for 2 weeks.  She has been taking Singulair.  She has been taking Flonase.  She has been taking Allegra.  Nothing seems to be helping.  She developed pain and pressure in her right maxillary sinus.  The bone there is tender.  She reports throbbing pain in her right ear.  She states that she feels like it is stopped up.  There was a cerumen impaction in the right ear the my nurse was able to remove with irrigation and lavage.  On examination, the right tympanic membrane appears pearly [place].  It is not infected.  Therefore I believe that her ear pain is due to eustachian tube dysfunction.  This is likely due to sinusitis brought on by allergies. Past Medical History:  Diagnosis Date   COVID-19 08/19/2019   Symptoms resolved 08/22/19   Diabetes mellitus type 2 in obese [facility])    Family history of adverse reaction to anesthesia    son - Malignant Hyperthermia    GERD (gastroesophageal reflux disease)    High cholesterol    Hyperlipidemia    Hypertension    Tubular adenoma of colon    Wears contact lenses    Past Surgical History:  Procedure Laterality Date   CESAREAN SECTION     CHOLECYSTECTOMY     COLONOSCOPY WITH PROPOFOL N/A 10/21/2019   Procedure: COLONOSCOPY WITH PROPOFOL;  Surgeon: Jonathon Bellows, MD;  Location: [facility] ENDOSCOPY;  Service: Endoscopy;  Laterality: N/A;  Diabetic - oral meds COVID (+) - 08/19/19   NASAL SINUS SURGERY  2017   Dr.Byers [facility]   Current Outpatient Medications on File Prior to Visit  Medication Sig Dispense Refill   Cholecalciferol (VITAMIN D3) 25 MCG (1000 UT) CAPS Take 1 capsule (1,000 Units total) by mouth daily. 90 capsule 3   hydrochlorothiazide (HYDRODIURIL) 25 MG tablet Take 1 tablet (25 mg total) by mouth daily. 90 tablet 3   metFORMIN (GLUCOPHAGE) 500 MG tablet TAKE 1  TABLET BY MOUTH TWICE (2) DAILY WITH A MEAL 180 tablet 1   montelukast (SINGULAIR) 10 MG tablet Take 1 tablet (10 mg total) by mouth at bedtime. 30 tablet 5   potassium chloride SA (KLOR-CON M) 20 MEQ tablet Take 1 tablet (20 mEq total) by mouth daily. 90 tablet 3   rosuvastatin (CRESTOR) 20 MG tablet Take 1 tablet (20 mg total) by mouth daily. 90 tablet 3   No current facility-administered medications on file prior to visit.    No Known Allergies Social History   Socioeconomic History   Marital status: Married    Spouse name: Not on file   Number of children: Not on file   Years of education: Not on file   Highest education level: Not on file  Occupational History   Not on file  Tobacco Use   Smoking status: Never   Smokeless tobacco: Never  Vaping Use   Vaping Use: Never used  Substance and Sexual Activity   Alcohol use: No   Drug use: Never   Sexual activity: Yes    Comment: divorced  Other Topics Concern   Not on file  Social History Narrative   ** Merged History Encounter **       Social Determinants of Health   Financial Resource Strain: Not on file  Food Insecurity: Not on file  Transportation Needs: Not on file  Physical Activity: Not on file  Stress: Not on file  Social Connections: Not on file  Intimate Partner Violence: Not on file     Review of Systems  All other systems reviewed and are negative.      Objective:   Physical Exam Vitals reviewed.  Constitutional:      General: She is not in acute distress.    Appearance: She is well-developed. She is not diaphoretic.  HENT:     Right Ear: Tympanic membrane and ear canal normal.     Left Ear: Tympanic membrane and ear canal normal.     Nose: Congestion and rhinorrhea present.     Right Sinus: Maxillary sinus tenderness present.  Eyes:     Conjunctiva/sclera: Conjunctivae normal.  Cardiovascular:     Rate and Rhythm: Normal rate and regular rhythm.     Pulses: Normal pulses.     Heart  sounds: Normal heart sounds. No murmur heard. Pulmonary:     Effort: Pulmonary effort is normal. No respiratory distress.     Breath sounds: No stridor. No rhonchi or rales.  Abdominal:     General: Abdomen is flat. Bowel sounds are normal.     Palpations: Abdomen is soft.  Musculoskeletal:     Cervical back: No rigidity.  Lymphadenopathy:     Cervical: No cervical adenopathy.  Neurological:     General: No focal deficit present.     Mental Status: She is oriented to person, place, and time.     Cranial Nerves: No cranial nerve deficit.           Assessment & Plan:  Acute non-recurrent maxillary sinusitis  Symptoms sound consistent with bacterial sinusitis.  Begin Omnicef 300 mg twice daily for 10 days and Sudafed in addition to nasal saline rinses.  Consider using prednisone if symptoms do not improve.

## 2023-01-03 ENCOUNTER — Encounter: Payer: Self-pay | Admitting: Family Medicine

## 2023-01-03 ENCOUNTER — Ambulatory Visit: Payer: Managed Care, Other (non HMO) | Admitting: Family Medicine

## 2023-01-03 VITALS — BP 134/72 | HR 87 | Temp 97.6°F | Ht 64.0 in | Wt 155.2 lb

## 2023-01-03 DIAGNOSIS — R053 Chronic cough: Secondary | ICD-10-CM

## 2023-01-03 MED ORDER — PANTOPRAZOLE SODIUM 40 MG PO TBEC: 40.0000 mg | DELAYED_RELEASE_TABLET | Freq: Every day | ORAL | 3 refills | Status: DC

## 2023-01-03 MED ORDER — TRAMADOL HCL 50 MG PO TABS: 50.0000 mg | ORAL_TABLET | Freq: Three times a day (TID) | ORAL | 0 refills | Status: DC | PRN

## 2023-01-03 NOTE — Progress Notes (Signed)
Subjective:    Patient ID: Stephanie [name], female    DOB: [birth year], 57 y.o.   MRN: [id number]  Patient reports a cough for 3 months.  She states the cough is dry nonproductive.  It is a tickle in her throat.  She denies any fevers or chills.  She denies any hemoptysis.  However the cough is steadily worsened over the last 2 weeks.  She is coughing frequently during our encounter today.  She denies any chest pain.  She denies any pleurisy.  She denies any wheezing.  She denies any sneezing or rhinorrhea.  She has no history of asthma.  She does have some acid reflux and she questions if acid reflux could be triggering her cough.  She denies any night sweats weight loss or fever Past Medical History:  Diagnosis Date   COVID-19 08/19/2019   Symptoms resolved 08/22/19   Diabetes mellitus type 2 in obese    Family history of adverse reaction to anesthesia    son - Malignant Hyperthermia    GERD (gastroesophageal reflux disease)    High cholesterol    Hyperlipidemia    Hypertension    Tubular adenoma of colon    Wears contact lenses    Past Surgical History:  Procedure Laterality Date   CESAREAN SECTION     CHOLECYSTECTOMY     COLONOSCOPY WITH PROPOFOL N/A 10/21/2019   Procedure: COLONOSCOPY WITH PROPOFOL;  Surgeon: Wyline Mood, MD;  Location: [facility] ENDOSCOPY;  Service: Endoscopy;  Laterality: N/A;  Diabetic - oral meds COVID (+) - 08/19/19   NASAL SINUS SURGERY  2017   Dr.Byers [facility]   Current Outpatient Medications on File Prior to Visit  Medication Sig Dispense Refill   Cholecalciferol (VITAMIN D3) 25 MCG (1000 UT) CAPS Take 1 capsule (1,000 Units total) by mouth daily. 90 capsule 3   hydrochlorothiazide (HYDRODIURIL) 25 MG tablet Take 1 tablet (25 mg total) by mouth daily. (Patient taking differently: Take 12.5 mg by mouth daily.) 90 tablet 3   metFORMIN (GLUCOPHAGE) 500 MG tablet TAKE 1 TABLET BY MOUTH TWICE (2) DAILY WITH A MEAL 180 tablet 1   montelukast (SINGULAIR) 10 MG tablet  Take 1 tablet (10 mg total) by mouth at bedtime. 30 tablet 5   potassium chloride SA (KLOR-CON M) 20 MEQ tablet Take 1 tablet (20 mEq total) by mouth daily. 90 tablet 3   rosuvastatin (CRESTOR) 20 MG tablet Take 1 tablet (20 mg total) by mouth daily. 90 tablet 3   No current facility-administered medications on file prior to visit.    No Known Allergies Social History   Socioeconomic History   Marital status: Married    Spouse name: Not on file   Number of children: Not on file   Years of education: Not on file   Highest education level: Not on file  Occupational History   Not on file  Tobacco Use   Smoking status: Never   Smokeless tobacco: Never  Vaping Use   Vaping Use: Never used  Substance and Sexual Activity   Alcohol use: No   Drug use: Never   Sexual activity: Yes    Comment: divorced  Other Topics Concern   Not on file  Social History Narrative   ** Merged History Encounter **       Social Determinants of Health   Financial Resource Strain: Not on file  Food Insecurity: Not on file  Transportation Needs: Not on file  Physical Activity: Not on file  Stress:  Not on file  Social Connections: Not on file  Intimate Partner Violence: Not on file     Review of Systems  All other systems reviewed and are negative.      Objective:   Physical Exam Vitals reviewed.  Constitutional:      General: She is not in acute distress.    Appearance: She is well-developed. She is not diaphoretic.  HENT:     Right Ear: Tympanic membrane and ear canal normal.     Left Ear: Tympanic membrane and ear canal normal.     Nose: No congestion or rhinorrhea.     Mouth/Throat:     Pharynx: No oropharyngeal exudate or posterior oropharyngeal erythema.  Eyes:     Conjunctiva/sclera: Conjunctivae normal.  Cardiovascular:     Rate and Rhythm: Normal rate and regular rhythm.     Pulses: Normal pulses.     Heart sounds: Normal heart sounds. No murmur heard. Pulmonary:      Effort: Pulmonary effort is normal. No respiratory distress.     Breath sounds: No stridor. No wheezing, rhonchi or rales.  Abdominal:     General: Abdomen is flat. Bowel sounds are normal.     Palpations: Abdomen is soft.  Musculoskeletal:     Cervical back: No rigidity.  Lymphadenopathy:     Cervical: No cervical adenopathy.  Neurological:     General: No focal deficit present.     Mental Status: She is oriented to person, place, and time.     Cranial Nerves: No cranial nerve deficit.           Assessment & Plan:  Chronic cough Patient has had a chronic cough for 3 months but has recently worsened considerably.  However she denies any allergies.  She denies any asthma.  Therefore I suspect laryngal esophageal reflux.  Begin Protonix 40 mg daily.  Start tramadol 50 mg every 8 hours as needed for cough.  Recheck in 2 weeks if no better or sooner if worsening

## 2023-01-10 ENCOUNTER — Ambulatory Visit: Payer: Managed Care, Other (non HMO) | Admitting: Family Medicine

## 2023-01-10 ENCOUNTER — Encounter: Payer: Self-pay | Admitting: Family Medicine

## 2023-01-10 ENCOUNTER — Other Ambulatory Visit: Payer: Self-pay | Admitting: Family Medicine

## 2023-01-10 VITALS — BP 118/70 | HR 80 | Temp 97.9°F | Ht 64.0 in | Wt 154.4 lb

## 2023-01-10 DIAGNOSIS — Z7984 Long term (current) use of oral hypoglycemic drugs: Secondary | ICD-10-CM | POA: Diagnosis not present

## 2023-01-10 DIAGNOSIS — E118 Type 2 diabetes mellitus with unspecified complications: Secondary | ICD-10-CM

## 2023-01-10 MED ORDER — METFORMIN HCL 1000 MG PO TABS: 1000.0000 mg | ORAL_TABLET | Freq: Two times a day (BID) | ORAL | 3 refills | Status: DC

## 2023-01-10 NOTE — Telephone Encounter (Signed)
Requested medication (s) are due for refill today - no  Requested medication (s) are on the active medication list -no  Future visit scheduled -yes  Last refill: 01/03/23  Notes to clinic: non delegated Rx, no longer listed on current medication list  Requested Prescriptions  Pending Prescriptions Disp Refills   traMADol (ULTRAM) 50 MG tablet [Pharmacy Med Name: TRAMADOL HYDROCHLORIDE 50MG  TABLET] 30 tablet 0    Sig: TAKE ONE TABLET (50 MG TOTAL) BY MOUTH EVERY EIGHT (EIGHT) HOURS AS NEEDED (COUGH).     Not Delegated - Analgesics:  Opioid Agonists Failed - 01/10/2023  8:45 AM      Failed - This refill cannot be delegated      Failed - Urine Drug Screen completed in last 360 days      Failed - Valid encounter within last 3 months    Recent Outpatient Visits           1 year ago Controlled type 2 diabetes mellitus with complication, without long-term current use of insulin (HCC)   [facility] Pickard, Priscille Heidelberg, MD   1 year ago Acute pain of left shoulder   [facility] Pickard, Priscille Heidelberg, MD   1 year ago Primary hypertension   [facility] Valentino Nose, NP   1 year ago Primary hypertension   [facility] Valentino Nose, NP   1 year ago Epigastric abdominal pain   [facility] Pickard, Priscille Heidelberg, MD       Future Appointments             In 6 months Pickard, Priscille Heidelberg, MD [facility]               Requested Prescriptions  Pending Prescriptions Disp Refills   traMADol (ULTRAM) 50 MG tablet [Pharmacy Med Name: TRAMADOL HYDROCHLORIDE 50MG  TABLET] 30 tablet 0    Sig: TAKE ONE TABLET (50 MG TOTAL) BY MOUTH EVERY EIGHT (EIGHT) HOURS AS NEEDED (COUGH).     Not Delegated - Analgesics:  Opioid Agonists Failed - 01/10/2023  8:45 AM      Failed - This refill cannot be delegated      Failed - Urine Drug Screen completed in last 360 days      Failed -  Valid encounter within last 3 months    Recent Outpatient Visits           1 year ago Controlled type 2 diabetes mellitus with complication, without long-term current use of insulin (HCC)   [facility] Pickard, Priscille Heidelberg, MD   1 year ago Acute pain of left shoulder   [facility] Pickard, Priscille Heidelberg, MD   1 year ago Primary hypertension   [facility] Valentino Nose, NP   1 year ago Primary hypertension   [facility] Valentino Nose, NP   1 year ago Epigastric abdominal pain   [facility] Pickard, Priscille Heidelberg, MD       Future Appointments             In 6 months Pickard, Priscille Heidelberg, MD [facility]

## 2023-01-10 NOTE — Progress Notes (Signed)
Subjective:    Patient ID: Stephanie [name], female    DOB: 1966-06-18, 57 y.o.   MRN: [id number]  Patient had stopped a GLP-1 agonist due to severe nausea.  She is currently on metformin 500 mg twice daily.  She denies any diarrhea.  She continues to lose weight. Wt Readings from Last 3 Encounters:  01/10/23 154 lb 6.4 oz (70 kg)  01/03/23 155 lb 3.2 oz (70.4 kg)  11/17/22 157 lb (71.2 kg)   Her fasting blood sugars in the morning are typically between 130 and 150.  She will occasionally have a blood sugar under 130 but the vast majority are right around 130.  She denies any hypoglycemic episodes.  She denies any lightheadedness.  She denies any neuropathy in her feet.  Diabetic foot exam was performed today and is normal Past Medical History:  Diagnosis Date   COVID-19 08/19/2019   Symptoms resolved 08/22/19   Diabetes mellitus type 2 in obese    Family history of adverse reaction to anesthesia    son - Malignant Hyperthermia    GERD (gastroesophageal reflux disease)    High cholesterol    Hyperlipidemia    Hypertension    Tubular adenoma of colon    Wears contact lenses    Past Surgical History:  Procedure Laterality Date   CESAREAN SECTION     CHOLECYSTECTOMY     COLONOSCOPY WITH PROPOFOL N/A 10/21/2019   Procedure: COLONOSCOPY WITH PROPOFOL;  Surgeon: Wyline Mood, MD;  Location: [facility] ENDOSCOPY;  Service: Endoscopy;  Laterality: N/A;  Diabetic - oral meds COVID (+) - 08/19/19   NASAL SINUS SURGERY  2017   [facility]   Current Outpatient Medications on File Prior to Visit  Medication Sig Dispense Refill   Cholecalciferol (VITAMIN D3) 25 MCG (1000 UT) CAPS Take 1 capsule (1,000 Units total) by mouth daily. 90 capsule 3   hydrochlorothiazide (HYDRODIURIL) 25 MG tablet Take 1 tablet (25 mg total) by mouth daily. (Patient taking differently: Take 12.5 mg by mouth daily.) 90 tablet 3   metFORMIN (GLUCOPHAGE) 500 MG tablet TAKE 1 TABLET BY MOUTH TWICE (2) DAILY WITH A MEAL 180  tablet 1   montelukast (SINGULAIR) 10 MG tablet Take 1 tablet (10 mg total) by mouth at bedtime. 30 tablet 5   pantoprazole (PROTONIX) 40 MG tablet Take 1 tablet (40 mg total) by mouth daily. 30 tablet 3   potassium chloride SA (KLOR-CON M) 20 MEQ tablet Take 1 tablet (20 mEq total) by mouth daily. 90 tablet 3   rosuvastatin (CRESTOR) 20 MG tablet Take 1 tablet (20 mg total) by mouth daily. 90 tablet 3   No current facility-administered medications on file prior to visit.   No Known Allergies Social History   Socioeconomic History   Marital status: Married    Spouse name: Not on file   Number of children: Not on file   Years of education: Not on file   Highest education level: Not on file  Occupational History   Not on file  Tobacco Use   Smoking status: Never   Smokeless tobacco: Never  Vaping Use   Vaping Use: Never used  Substance and Sexual Activity   Alcohol use: No   Drug use: Never   Sexual activity: Yes    Comment: divorced  Other Topics Concern   Not on file  Social History Narrative   ** Merged History Encounter **       Social Determinants of Health   Financial  Resource Strain: Not on file  Food Insecurity: Not on file  Transportation Needs: Not on file  Physical Activity: Not on file  Stress: Not on file  Social Connections: Not on file  Intimate Partner Violence: Not on file      Review of Systems  Musculoskeletal:  Positive for neck pain.  All other systems reviewed and are negative.      Objective:   Physical Exam Vitals reviewed.  Constitutional:      Appearance: Normal appearance.  HENT:     Nose:     Right Turbinates: Not enlarged.     Left Turbinates: Not enlarged.  Neck:     Vascular: No carotid bruit.  Cardiovascular:     Rate and Rhythm: Normal rate and regular rhythm.     Pulses: Normal pulses.     Heart sounds: Normal heart sounds. No murmur heard.    No friction rub. No gallop.  Pulmonary:     Effort: Pulmonary effort is  normal. No respiratory distress.     Breath sounds: Normal breath sounds. No wheezing, rhonchi or rales.  Chest:     Chest wall: No tenderness.  Abdominal:     General: Abdomen is flat. Bowel sounds are normal.     Palpations: Abdomen is soft.  Musculoskeletal:     Right lower leg: No edema.     Left lower leg: No edema.  Neurological:     Mental Status: She is alert.       Assessment & Plan:  Controlled type 2 diabetes mellitus with complication, without long-term current use of insulin (HCC) - Plan: Hemoglobin A1c, COMPLETE METABOLIC PANEL WITH GFR, Lipid panel, Protein / Creatinine Ratio, Urine Blood sugars are close to being well-controlled.  Together we decided to increase metformin to 1000 mg twice daily I will check a CBC, CMP fasting lipid panel A1c and urine protein creatinine ratio.  I like to see her A1c less than 6.5.  I believe metformin 1000 mg twice daily will accomplish that.  Monitor for any diarrhea.  We also discussed Jardiance but at the present time we decided to continue metformin

## 2023-01-11 LAB — COMPLETE METABOLIC PANEL WITH GFR
AG Ratio: 1.3 (calc) (ref 1.0–2.5)
ALT: 19 U/L (ref 6–29)
AST: 24 U/L (ref 10–35)
Albumin: 4.4 g/dL (ref 3.6–5.1)
Alkaline phosphatase (APISO): 62 U/L (ref 37–153)
BUN: 12 mg/dL (ref 7–25)
CO2: 30 mmol/L (ref 20–32)
Calcium: 9.8 mg/dL (ref 8.6–10.4)
Chloride: 98 mmol/L (ref 98–110)
Creat: 0.74 mg/dL (ref 0.50–1.03)
Globulin: 3.5 g/dL (calc) (ref 1.9–3.7)
Glucose, Bld: 116 mg/dL — ABNORMAL HIGH (ref 65–99)
Potassium: 3.6 mmol/L (ref 3.5–5.3)
Sodium: 140 mmol/L (ref 135–146)
Total Bilirubin: 0.3 mg/dL (ref 0.2–1.2)
Total Protein: 7.9 g/dL (ref 6.1–8.1)
eGFR: 95 mL/min/{1.73_m2} (ref >=60)

## 2023-01-11 LAB — LIPID PANEL
Cholesterol: 156 mg/dL (ref <200)
HDL: 48 mg/dL — ABNORMAL LOW (ref >=50)
LDL Cholesterol (Calc): 87 mg/dL (calc)
Non-HDL Cholesterol (Calc): 108 mg/dL (calc) (ref <130)
Total CHOL/HDL Ratio: 3.3 (calc) (ref <5.0)
Triglycerides: 109 mg/dL (ref <150)

## 2023-01-11 LAB — PROTEIN / CREATININE RATIO, URINE
Creatinine, Urine: 261 mg/dL (ref 20–275)
Protein/Creat Ratio: 149 mg/g creat (ref 24–184)
Protein/Creatinine Ratio: 0.149 mg/mg creat (ref 0.024–0.184)
Total Protein, Urine: 39 mg/dL — ABNORMAL HIGH (ref 5–24)

## 2023-01-11 LAB — HEMOGLOBIN A1C
Hgb A1c MFr Bld: 6.5 % of total Hgb — ABNORMAL HIGH (ref <5.7)
Mean Plasma Glucose: 140 mg/dL
eAG (mmol/L): 7.7 mmol/L

## 2023-01-12 ENCOUNTER — Encounter: Payer: Self-pay | Admitting: Family Medicine

## 2023-02-03 ENCOUNTER — Encounter: Payer: Self-pay | Admitting: Family Medicine

## 2023-02-03 ENCOUNTER — Ambulatory Visit: Payer: Managed Care, Other (non HMO) | Admitting: Family Medicine

## 2023-02-03 ENCOUNTER — Ambulatory Visit
Admission: RE | Admit: 2023-02-03 | Discharge: 2023-02-03 | Disposition: A | Discharge status: Home / Self Care | Payer: Managed Care, Other (non HMO) | Source: Ambulatory Visit | Attending: Family Medicine | Admitting: Family Medicine

## 2023-02-03 VITALS — BP 120/72 | HR 88 | Temp 98.3°F | Ht 64.0 in | Wt 154.0 lb

## 2023-02-03 DIAGNOSIS — R053 Chronic cough: Secondary | ICD-10-CM | POA: Diagnosis not present

## 2023-02-03 MED ORDER — PREDNISONE 20 MG PO TABS: ORAL_TABLET | ORAL | 0 refills | Status: DC

## 2023-02-03 NOTE — Progress Notes (Signed)
Subjective:    Patient ID: Stephanie [name], female    DOB: 07-05-1966, 57 y.o.   MRN: [id number] 01/03/23 Patient reports a cough for 3 months.  She states the cough is dry nonproductive.  It is a tickle in her throat.  She denies any fevers or chills.  She denies any hemoptysis.  However the cough is steadily worsened over the last 2 weeks.  She is coughing frequently during our encounter today.  She denies any chest pain.  She denies any pleurisy.  She denies any wheezing.  She denies any sneezing or rhinorrhea.  She has no history of asthma.  She does have some acid reflux and she questions if acid reflux could be triggering her cough.  She denies any night sweats weight loss or fever.  At that time, my plan was: Patient has had a chronic cough for 3 months but has recently worsened considerably.  However she denies any allergies.  She denies any asthma.  Therefore I suspect laryngal esophageal reflux.  Begin Protonix 40 mg daily.  Start tramadol 50 mg every 8 hours as needed for cough.  Recheck in 2 weeks if no better or sooner if worsening  02/03/23 Patient states the coughing has not improved.  She continues to have coughing spells.  She states that the nurse who is her coworker heard her wheezing the other day.  Here today she does have diminished breath sounds and faint expiratory wheezing with coughing.  She denies any fevers or chills or hemoptysis or purulent sputum.  She does continue to have occasional acid reflux and some epigastric discomfort.  She denies any pleurisy or chest pain. Past Medical History:  Diagnosis Date   COVID-19 08/19/2019   Symptoms resolved 08/22/19   Diabetes mellitus type 2 in obese    Family history of adverse reaction to anesthesia    son - Malignant Hyperthermia    GERD (gastroesophageal reflux disease)    High cholesterol    Hyperlipidemia    Hypertension    Tubular adenoma of colon    Wears contact lenses    Past Surgical History:  Procedure  Laterality Date   CESAREAN SECTION     CHOLECYSTECTOMY     COLONOSCOPY WITH PROPOFOL N/A 10/21/2019   Procedure: COLONOSCOPY WITH PROPOFOL;  Surgeon: Wyline Mood, MD;  Location: [facility] ENDOSCOPY;  Service: Endoscopy;  Laterality: N/A;  Diabetic - oral meds COVID (+) - 08/19/19   NASAL SINUS SURGERY  2017   [facility]   Current Outpatient Medications on File Prior to Visit  Medication Sig Dispense Refill   Cholecalciferol (VITAMIN D3) 25 MCG (1000 UT) CAPS Take 1 capsule (1,000 Units total) by mouth daily. 90 capsule 3   hydrochlorothiazide (HYDRODIURIL) 25 MG tablet Take 1 tablet (25 mg total) by mouth daily. (Patient taking differently: Take 12.5 mg by mouth daily.) 90 tablet 3   metFORMIN (GLUCOPHAGE) 1000 MG tablet Take 1 tablet (1,000 mg total) by mouth 2 (two) times daily with a meal. 180 tablet 3   montelukast (SINGULAIR) 10 MG tablet Take 1 tablet (10 mg total) by mouth at bedtime. 30 tablet 5   pantoprazole (PROTONIX) 40 MG tablet Take 1 tablet (40 mg total) by mouth daily. 30 tablet 3   potassium chloride SA (KLOR-CON M) 20 MEQ tablet Take 1 tablet (20 mEq total) by mouth daily. 90 tablet 3   rosuvastatin (CRESTOR) 20 MG tablet Take 1 tablet (20 mg total) by mouth daily. 90 tablet 3  No current facility-administered medications on file prior to visit.    No Known Allergies Social History   Socioeconomic History   Marital status: Married    Spouse name: Not on file   Number of children: Not on file   Years of education: Not on file   Highest education level: Not on file  Occupational History   Not on file  Tobacco Use   Smoking status: Never   Smokeless tobacco: Never  Vaping Use   Vaping Use: Never used  Substance and Sexual Activity   Alcohol use: No   Drug use: Never   Sexual activity: Yes    Comment: divorced  Other Topics Concern   Not on file  Social History Narrative   ** Merged History Encounter **       Social Determinants of Health   Financial  Resource Strain: Not on file  Food Insecurity: Not on file  Transportation Needs: Not on file  Physical Activity: Not on file  Stress: Not on file  Social Connections: Not on file  Intimate Partner Violence: Not on file     Review of Systems  All other systems reviewed and are negative.      Objective:   Physical Exam Vitals reviewed.  Constitutional:      General: She is not in acute distress.    Appearance: She is well-developed. She is not diaphoretic.  HENT:     Right Ear: Tympanic membrane and ear canal normal.     Left Ear: Tympanic membrane and ear canal normal.     Nose: No congestion or rhinorrhea.     Mouth/Throat:     Pharynx: No oropharyngeal exudate or posterior oropharyngeal erythema.  Eyes:     Conjunctiva/sclera: Conjunctivae normal.  Cardiovascular:     Rate and Rhythm: Normal rate and regular rhythm.     Pulses: Normal pulses.     Heart sounds: Normal heart sounds. No murmur heard. Pulmonary:     Effort: Pulmonary effort is normal. No respiratory distress.     Breath sounds: Decreased air movement present. No stridor. Wheezing present. No rhonchi or rales.  Abdominal:     General: Abdomen is flat. Bowel sounds are normal.     Palpations: Abdomen is soft.  Musculoskeletal:     Cervical back: No rigidity.  Lymphadenopathy:     Cervical: No cervical adenopathy.  Neurological:     General: No focal deficit present.     Mental Status: She is oriented to person, place, and time.     Cranial Nerves: No cranial nerve deficit.           Assessment & Plan:  Chronic cough - Plan: DG Chest 2 View Cough may be multifactorial.  I do believe that acid reflux is playing a role.  Continue pantoprazole.  Patient did elevate the head of her bed.  However, there may be an element of cough variant asthma.  Begin prednisone taper pack and start Breztri 2 puffs twice daily and reassess in 1 week.  Obtain chest x-ray to evaluate for any abnormalities that would  require further investigation.  Reassess in 1 week.  If there are increased interstitial lung markings, plan to get CT of the lung

## 2023-02-06 ENCOUNTER — Encounter: Payer: Self-pay | Admitting: Family Medicine

## 2023-02-06 ENCOUNTER — Other Ambulatory Visit: Payer: Self-pay | Admitting: Family Medicine

## 2023-02-06 ENCOUNTER — Telehealth: Payer: Self-pay

## 2023-02-06 NOTE — Telephone Encounter (Signed)
Pt called in to discuss results with nurse/pcp. Pt would like to know what the next steps are please. Please advise  Cb#: [phone]

## 2023-02-07 ENCOUNTER — Other Ambulatory Visit: Payer: Self-pay

## 2023-02-07 ENCOUNTER — Telehealth: Payer: Self-pay

## 2023-02-07 ENCOUNTER — Other Ambulatory Visit: Payer: Self-pay | Admitting: Family Medicine

## 2023-02-07 DIAGNOSIS — K222 Esophageal obstruction: Secondary | ICD-10-CM

## 2023-02-07 DIAGNOSIS — R053 Chronic cough: Secondary | ICD-10-CM

## 2023-02-07 MED ORDER — HYDROCODONE BIT-HOMATROP MBR 5-1.5 MG/5ML PO SOLN: 5.0000 mL | Freq: Three times a day (TID) | ORAL | 0 refills | Status: DC | PRN

## 2023-02-07 NOTE — Telephone Encounter (Signed)
Pt called, tearful, stating that "the prednisone and inhaler aren't working." Pt states she is in with her throat, still wheezing and feels terrible. Pt states that she called her GI but they are unable to see her until September. I have made pt an appointment for Thursday. Is there anything else the patient can do? Thanks.

## 2023-02-09 ENCOUNTER — Other Ambulatory Visit: Payer: Self-pay | Admitting: Gastroenterology

## 2023-02-09 ENCOUNTER — Ambulatory Visit: Payer: Managed Care, Other (non HMO) | Admitting: Family Medicine

## 2023-02-09 DIAGNOSIS — K219 Gastro-esophageal reflux disease without esophagitis: Secondary | ICD-10-CM

## 2023-02-09 DIAGNOSIS — R1314 Dysphagia, pharyngoesophageal phase: Secondary | ICD-10-CM

## 2023-02-10 ENCOUNTER — Ambulatory Visit: Payer: Managed Care, Other (non HMO) | Admitting: Family Medicine

## 2023-02-14 ENCOUNTER — Ambulatory Visit
Admission: RE | Admit: 2023-02-14 | Discharge: 2023-02-14 | Disposition: A | Discharge status: Home / Self Care | Payer: Managed Care, Other (non HMO) | Source: Ambulatory Visit | Attending: Gastroenterology | Admitting: Gastroenterology

## 2023-02-14 DIAGNOSIS — R1314 Dysphagia, pharyngoesophageal phase: Secondary | ICD-10-CM | POA: Diagnosis present

## 2023-02-14 DIAGNOSIS — K219 Gastro-esophageal reflux disease without esophagitis: Secondary | ICD-10-CM | POA: Insufficient documentation

## 2023-02-15 ENCOUNTER — Encounter: Payer: Self-pay | Admitting: Family Medicine

## 2023-02-16 ENCOUNTER — Telehealth: Payer: Self-pay

## 2023-02-16 NOTE — Telephone Encounter (Signed)
Pt called in to request a referral be sent to Dr. Willeen Cass office at [facility] in [place] please. Please advise.  Cb#: [phone]

## 2023-02-17 ENCOUNTER — Telehealth: Payer: Self-pay

## 2023-02-17 NOTE — Telephone Encounter (Signed)
Pt has called and states she is still having an issue with a cough. Pt states she has finished all of the Prednisone and the inhaler and cough medication doesn't seem to be helping. Pt states she also still has a "lump" on her neck. Pt does have an appointment with Pulmonology at the end of July.  Pt asks is there anything else that can be done to help with the cough? Pt states she does have a family hx of Thyroid dz and asks if her Thyroid could be checked, since she still has the lump on her neck?

## 2023-02-20 ENCOUNTER — Other Ambulatory Visit: Payer: Self-pay | Admitting: Family Medicine

## 2023-02-20 ENCOUNTER — Telehealth: Payer: Self-pay | Admitting: Family Medicine

## 2023-02-20 DIAGNOSIS — R221 Localized swelling, mass and lump, neck: Secondary | ICD-10-CM

## 2023-02-20 NOTE — Telephone Encounter (Signed)
Tried calling pt back, LVM for pt to returned call

## 2023-02-20 NOTE — Telephone Encounter (Signed)
Tried calling pt, LVM for pt to call back re pcp's msg "Lets first get US of the neck to see what lump is.  I also want her to see pulmonology for the chronic cough."

## 2023-02-20 NOTE — Telephone Encounter (Signed)
Patient requesting call back regarding radiology referral: has questions. Please advise at [phone].

## 2023-02-20 NOTE — Telephone Encounter (Signed)
Pt returned call and was given pcp's notes and recommendations from pt's msg re: lump on neck.   Pt voiced understanding and is aware of the [place] order per pcp

## 2023-02-24 ENCOUNTER — Ambulatory Visit
Admission: RE | Admit: 2023-02-24 | Discharge: 2023-02-24 | Disposition: A | Discharge status: Home / Self Care | Payer: Managed Care, Other (non HMO) | Source: Ambulatory Visit | Attending: Family Medicine | Admitting: Family Medicine

## 2023-02-24 ENCOUNTER — Telehealth: Payer: Self-pay

## 2023-02-24 DIAGNOSIS — R221 Localized swelling, mass and lump, neck: Secondary | ICD-10-CM

## 2023-02-24 NOTE — Telephone Encounter (Signed)
Pt called in wanting to discuss CT results with pcp/or nurse. Please advise.  Cb#: [phone]

## 2023-02-27 ENCOUNTER — Encounter: Payer: Self-pay | Admitting: Family Medicine

## 2023-02-27 NOTE — Telephone Encounter (Signed)
Tried calling pt regarding pt's msg. NA, LVM for return call.

## 2023-03-01 ENCOUNTER — Telehealth: Payer: Self-pay

## 2023-03-01 NOTE — Telephone Encounter (Signed)
My Chart message from patient:  Can you please send a referral to Dr . Dorisann Frames for me. The fax number is [phone] Attn [facility]- referral dept. Thanks.  I see the pulmonologist on July 26th.  Dr. Talmage Nap is an Endocrinologist. Okay to refer? Thanks.

## 2023-03-06 ENCOUNTER — Other Ambulatory Visit: Payer: Self-pay | Admitting: Family Medicine

## 2023-03-06 DIAGNOSIS — E118 Type 2 diabetes mellitus with unspecified complications: Secondary | ICD-10-CM

## 2023-03-07 ENCOUNTER — Telehealth: Payer: Self-pay | Admitting: Family Medicine

## 2023-03-07 NOTE — Telephone Encounter (Signed)
Patient called for status update of referral to endocrinologist; requesting a call back.  Advised patient to call Dr. Willeen Cass office to schedule. Patient will call back if referral not received by them.   Please advise at [phone].

## 2023-03-08 ENCOUNTER — Telehealth: Payer: Self-pay | Admitting: Family Medicine

## 2023-03-08 NOTE — Telephone Encounter (Signed)
Patient called; Dr. Willeen Cass office received the office visit notes sent but no labs. They need the last three lab results. Patient asked for [place] to print them for her; she'll come pick them up on Friday.  Please advise at [phone].

## 2023-03-17 ENCOUNTER — Institutional Professional Consult (permissible substitution) (HOSPITAL_BASED_OUTPATIENT_CLINIC_OR_DEPARTMENT_OTHER): Payer: Managed Care, Other (non HMO) | Admitting: Pulmonary Disease

## 2023-03-17 ENCOUNTER — Other Ambulatory Visit: Payer: Self-pay | Admitting: Family Medicine

## 2023-03-17 DIAGNOSIS — J0141 Acute recurrent pansinusitis: Secondary | ICD-10-CM

## 2023-03-27 ENCOUNTER — Encounter: Payer: Self-pay | Admitting: Family Medicine

## 2023-03-27 ENCOUNTER — Other Ambulatory Visit: Payer: Self-pay | Admitting: Family Medicine

## 2023-03-28 NOTE — Telephone Encounter (Signed)
Requested Prescriptions  Pending Prescriptions Disp Refills   potassium chloride SA (KLOR-CON M) 20 MEQ tablet [Pharmacy Med Name: POTASSIUM CHLORIDE ER ER TABLET ER] 90 tablet 3    Sig: TAKE ONE TABLET BY MOUTH ONCE A DAY     Endocrinology:  Minerals - Potassium Supplementation Failed - 03/27/2023 11:07 AM      Failed - Valid encounter within last 12 months    Recent Outpatient Visits           1 year ago Controlled type 2 diabetes mellitus with complication, without long-term current use of insulin (HCC)   [facility] Pickard, Priscille Heidelberg, MD   1 year ago Acute pain of left shoulder   [facility] Pickard, Priscille Heidelberg, MD   1 year ago Primary hypertension   [facility] Valentino Nose, NP   1 year ago Primary hypertension   [facility] Valentino Nose, NP   2 years ago Epigastric abdominal pain   [facility] Donita Brooks, MD       Future Appointments             In 3 months Pickard, Priscille Heidelberg, MD [facility]            Passed - K in normal range and within 360 days    Potassium  Date Value Ref Range Status  01/10/2023 3.6 3.5 - 5.3 mmol/L Final         Passed - Cr in normal range and within 360 days    Creat  Date Value Ref Range Status  01/10/2023 0.74 0.50 - 1.03 mg/dL Final   Creatinine, Urine  Date Value Ref Range Status  01/10/2023 261 20 - 275 mg/dL Final

## 2023-04-06 ENCOUNTER — Encounter: Payer: Self-pay | Admitting: Family Medicine

## 2023-04-07 LAB — HM DIABETES EYE EXAM

## 2023-04-10 ENCOUNTER — Other Ambulatory Visit: Payer: Self-pay | Admitting: Family Medicine

## 2023-04-11 NOTE — Telephone Encounter (Signed)
OV 01/10/23 Requested Prescriptions  Pending Prescriptions Disp Refills   hydrochlorothiazide (HYDRODIURIL) 25 MG tablet [Pharmacy Med Name: HYDROCHLOROTHIAZIDE 25MG  TABLET] 90 tablet 0    Sig: TAKE ONE TABLET BY MOUTH ONCE A DAY     Cardiovascular: Diuretics - Thiazide Failed - 04/10/2023  9:36 AM      Failed - Valid encounter within last 6 months    Recent Outpatient Visits           1 year ago Controlled type 2 diabetes mellitus with complication, without long-term current use of insulin (HCC)   [facility] Pickard, Priscille Heidelberg, MD   1 year ago Acute pain of left shoulder   [facility] Donita Brooks, MD   1 year ago Primary hypertension   [facility] Valentino Nose, NP   1 year ago Primary hypertension   [facility] Valentino Nose, NP   2 years ago Epigastric abdominal pain   [facility] Pickard, Priscille Heidelberg, MD       Future Appointments             In 3 months Pickard, Priscille Heidelberg, MD [facility]            Passed - Cr in normal range and within 180 days    Creat  Date Value Ref Range Status  01/10/2023 0.74 0.50 - 1.03 mg/dL Final   Creatinine, Urine  Date Value Ref Range Status  01/10/2023 261 20 - 275 mg/dL Final         Passed - K in normal range and within 180 days    Potassium  Date Value Ref Range Status  01/10/2023 3.6 3.5 - 5.3 mmol/L Final         Passed - Na in normal range and within 180 days    Sodium  Date Value Ref Range Status  01/10/2023 140 135 - 146 mmol/L Final  08/06/2016 143 134 - 144 mmol/L Final         Passed - Last BP in normal range    BP Readings from Last 1 Encounters:  02/03/23 120/72

## 2023-05-12 ENCOUNTER — Institutional Professional Consult (permissible substitution) (HOSPITAL_BASED_OUTPATIENT_CLINIC_OR_DEPARTMENT_OTHER): Payer: Managed Care, Other (non HMO) | Admitting: Pulmonary Disease

## 2023-05-12 ENCOUNTER — Encounter: Payer: Self-pay | Admitting: Family Medicine

## 2023-07-04 ENCOUNTER — Other Ambulatory Visit: Payer: Self-pay | Admitting: Family Medicine

## 2023-07-05 ENCOUNTER — Other Ambulatory Visit: Payer: Self-pay

## 2023-07-05 ENCOUNTER — Encounter: Payer: Self-pay | Admitting: Family Medicine

## 2023-07-05 DIAGNOSIS — E559 Vitamin D deficiency, unspecified: Secondary | ICD-10-CM

## 2023-07-05 MED ORDER — VITAMIN D3 25 MCG (1000 UT) PO CAPS: 1000.0000 [IU] | ORAL_CAPSULE | Freq: Every day | ORAL | 3 refills | Status: AC

## 2023-07-11 ENCOUNTER — Ambulatory Visit: Payer: Managed Care, Other (non HMO) | Admitting: Family Medicine

## 2023-08-14 ENCOUNTER — Other Ambulatory Visit: Payer: Self-pay | Admitting: Family Medicine

## 2023-08-25 ENCOUNTER — Other Ambulatory Visit: Payer: Self-pay | Admitting: Family Medicine

## 2023-08-25 DIAGNOSIS — Z1231 Encounter for screening mammogram for malignant neoplasm of breast: Secondary | ICD-10-CM

## 2023-09-22 ENCOUNTER — Other Ambulatory Visit: Payer: Self-pay | Admitting: Family Medicine

## 2023-09-22 NOTE — Telephone Encounter (Signed)
Last OV 01/10/23 Requested Prescriptions  Pending Prescriptions Disp Refills   hydrochlorothiazide (HYDRODIURIL) 25 MG tablet [Pharmacy Med Name: HYDROCHLOROTHIAZIDE 25MG  TABLET] 90 tablet 0    Sig: TAKE ONE TABLET BY MOUTH ONCE A DAY     Cardiovascular: Diuretics - Thiazide Failed - 09/22/2023  4:18 PM      Failed - Cr in normal range and within 180 days    Creat  Date Value Ref Range Status  01/10/2023 0.74 0.50 - 1.03 mg/dL Final   Creatinine, Urine  Date Value Ref Range Status  01/10/2023 261 20 - 275 mg/dL Final         Failed - K in normal range and within 180 days    Potassium  Date Value Ref Range Status  01/10/2023 3.6 3.5 - 5.3 mmol/L Final         Failed - Na in normal range and within 180 days    Sodium  Date Value Ref Range Status  01/10/2023 140 135 - 146 mmol/L Final  08/06/2016 143 134 - 144 mmol/L Final         Failed - Valid encounter within last 6 months    Recent Outpatient Visits           1 year ago Controlled type 2 diabetes mellitus with complication, without long-term current use of insulin (HCC)   [facility] Pickard, Priscille Heidelberg, MD   1 year ago Acute pain of left shoulder   [facility] Donita Brooks, MD   2 years ago Primary hypertension   [facility] Valentino Nose, NP   2 years ago Primary hypertension   [facility] Valentino Nose, NP   2 years ago Epigastric abdominal pain   [facility] Pickard, Priscille Heidelberg, MD       Future Appointments             In 3 months Pickard, Priscille Heidelberg, MD [facility]            Passed - Last BP in normal range    BP Readings from Last 1 Encounters:  02/03/23 120/72

## 2023-10-20 ENCOUNTER — Ambulatory Visit
Admission: RE | Admit: 2023-10-20 | Discharge: 2023-10-20 | Disposition: A | Discharge status: Home / Self Care | Payer: Managed Care, Other (non HMO) | Source: Ambulatory Visit | Attending: Family Medicine | Admitting: Family Medicine

## 2023-10-20 DIAGNOSIS — Z1231 Encounter for screening mammogram for malignant neoplasm of breast: Secondary | ICD-10-CM

## 2024-01-08 ENCOUNTER — Ambulatory Visit: Payer: Managed Care, Other (non HMO) | Admitting: Family Medicine

## 2024-01-08 ENCOUNTER — Encounter: Payer: Self-pay | Admitting: Family Medicine

## 2024-01-08 VITALS — BP 112/62 | HR 70 | Temp 97.8°F | Ht 64.0 in | Wt 145.6 lb

## 2024-01-08 DIAGNOSIS — Z7984 Long term (current) use of oral hypoglycemic drugs: Secondary | ICD-10-CM | POA: Diagnosis not present

## 2024-01-08 DIAGNOSIS — E118 Type 2 diabetes mellitus with unspecified complications: Secondary | ICD-10-CM

## 2024-01-08 NOTE — Progress Notes (Signed)
 Subjective:    Patient ID: Stephanie [name], female    DOB: 1965/08/28, 58 y.o.   MRN: [id number]  Patient is now seeing an endocrinologist.  She has discontinued metformin  and she is currently on Mounjaro 5 mg subcu weekly.  She is tolerating this better now.  She denies any nausea or vomiting or stomach upset.  Her blood pressure today is excellent at 112/62.  Her mammogram was performed in February.  Her last colonoscopy was in 2021.  They recommended a repeat colonoscopy in 2026 due to tubular adenomas.  Her Pap smear is performed by her gynecologist.  She is due for Pneumovax 23.  She is also due for Shingrix. Past Medical History:  Diagnosis Date   COVID-19 08/19/2019   Symptoms resolved 08/22/19   Diabetes mellitus type 2 in obese    Family history of adverse reaction to anesthesia    son - Malignant Hyperthermia    GERD (gastroesophageal reflux disease)    High cholesterol    Hyperlipidemia    Hypertension    Tubular adenoma of colon    Wears contact lenses    Past Surgical History:  Procedure Laterality Date   CESAREAN SECTION     CHOLECYSTECTOMY     COLONOSCOPY WITH PROPOFOL  N/A 10/21/2019   Procedure: COLONOSCOPY WITH PROPOFOL ;  Surgeon: Luke Salaam, MD;  Location: [facility] ENDOSCOPY;  Service: Endoscopy;  Laterality: N/A;  Diabetic - oral meds COVID (+) - 08/19/19   NASAL SINUS SURGERY  2017   Dr.Byers [facility]   Current Outpatient Medications on File Prior to Visit  Medication Sig Dispense Refill   HYDROcodone  bit-homatropine (HYCODAN) 5-1.5 MG/5ML syrup Take 5 mLs by mouth every 8 (eight) hours as needed for cough. 120 mL 0   Cholecalciferol (VITAMIN D3) 25 MCG (1000 UT) CAPS Take 1 capsule (1,000 Units total) by mouth daily. 90 capsule 3   hydrochlorothiazide  (HYDRODIURIL ) 25 MG tablet TAKE ONE TABLET BY MOUTH ONCE A DAY 90 tablet 0   metFORMIN  (GLUCOPHAGE ) 1000 MG tablet Take 1 tablet (1,000 mg total) by mouth 2 (two) times daily with a meal. 180 tablet 3   montelukast   (SINGULAIR ) 10 MG tablet TAKE ONE TABLET BY MOUTH EVERY NIGHT AT BEDTIME 30 tablet 5   pantoprazole  (PROTONIX ) 40 MG tablet TAKE ONE TABLET (40 MG TOTAL) BY MOUTH DAILY. 30 tablet 3   potassium chloride  SA (KLOR-CON  M) 20 MEQ tablet TAKE ONE TABLET BY MOUTH ONCE A DAY 90 tablet 3   predniSONE  (DELTASONE ) 20 MG tablet 3 tabs poqday 1-2, 2 tabs poqday 3-4, 1 tab poqday 5-6 12 tablet 0   rosuvastatin  (CRESTOR ) 20 MG tablet TAKE ONE TABLET BY MOUTH ONCE A DAY 90 tablet 3   No current facility-administered medications on file prior to visit.   No Known Allergies Social History   Socioeconomic History   Marital status: Married    Spouse name: Not on file   Number of children: Not on file   Years of education: Not on file   Highest education level: Not on file  Occupational History   Not on file  Tobacco Use   Smoking status: Never   Smokeless tobacco: Never  Vaping Use   Vaping status: Never Used  Substance and Sexual Activity   Alcohol use: No   Drug use: Never   Sexual activity: Yes    Comment: divorced  Other Topics Concern   Not on file  Social History Narrative   ** Merged History Encounter **  Social Drivers of Corporate investment banker Strain: Not on file  Food Insecurity: Not on file  Transportation Needs: Not on file  Physical Activity: Not on file  Stress: Not on file  Social Connections: Not on file  Intimate Partner Violence: Not on file      Review of Systems  Musculoskeletal:  Positive for neck pain.  All other systems reviewed and are negative.      Objective:   Physical Exam Vitals reviewed.  Constitutional:      Appearance: Normal appearance.  HENT:     Nose:     Right Turbinates: Not enlarged.     Left Turbinates: Not enlarged.  Neck:     Vascular: No carotid bruit.  Cardiovascular:     Rate and Rhythm: Normal rate and regular rhythm.     Pulses: Normal pulses.     Heart sounds: Normal heart sounds. No murmur heard.    No  friction rub. No gallop.  Pulmonary:     Effort: Pulmonary effort is normal. No respiratory distress.     Breath sounds: Normal breath sounds. No wheezing, rhonchi or rales.  Chest:     Chest wall: No tenderness.  Abdominal:     General: Abdomen is flat. Bowel sounds are normal.     Palpations: Abdomen is soft.  Musculoskeletal:     Right lower leg: No edema.     Left lower leg: No edema.  Neurological:     Mental Status: She is alert.       Assessment & Plan:  Controlled type 2 diabetes mellitus with complication, without long-term current use of insulin (HCC) - Plan: CBC with Differential/Platelet, Comprehensive metabolic panel with GFR, Lipid panel, Microalbumin / creatinine urine ratio, CANCELED: Hemoglobin A1c I am very happy with her blood pressure today.  Will check a fasting lipid panel.  I would like to see her LDL cholesterol less than 161.  Also check a urine protein to creatinine ratio.  Try to achieve a protein creatinine ratio less than 30.  Diabetic foot exam was performed today and is normal.  Recommended Shingrix.  Recommended Pneumovax 23.  Mammogram and colonoscopy are up-to-date.  Defer Pap smear to her gynecologist.

## 2024-01-09 ENCOUNTER — Ambulatory Visit: Payer: Self-pay | Admitting: Family Medicine

## 2024-01-09 LAB — CBC WITH DIFFERENTIAL/PLATELET
Absolute Lymphocytes: 3219 {cells}/uL (ref 850–3900)
Absolute Monocytes: 316 {cells}/uL (ref 200–950)
Basophils Absolute: 46 {cells}/uL (ref 0–200)
Basophils Relative: 0.6 %
Eosinophils Absolute: 339 {cells}/uL (ref 15–500)
Eosinophils Relative: 4.4 %
HCT: 40.7 % (ref 35.0–45.0)
Hemoglobin: 13.3 g/dL (ref 11.7–15.5)
MCH: 28.9 pg (ref 27.0–33.0)
MCHC: 32.7 g/dL (ref 32.0–36.0)
MCV: 88.3 fL (ref 80.0–100.0)
MPV: 10.6 fL (ref 7.5–12.5)
Monocytes Relative: 4.1 %
Neutro Abs: 3781 {cells}/uL (ref 1500–7800)
Neutrophils Relative %: 49.1 %
Platelets: 285 10*3/uL (ref 140–400)
RBC: 4.61 10*6/uL (ref 3.80–5.10)
RDW: 13.4 % (ref 11.0–15.0)
Total Lymphocyte: 41.8 %
WBC: 7.7 10*3/uL (ref 3.8–10.8)

## 2024-01-09 LAB — LIPID PANEL
Cholesterol: 151 mg/dL (ref <200)
HDL: 51 mg/dL (ref >=50)
LDL Cholesterol (Calc): 77 mg/dL
Non-HDL Cholesterol (Calc): 100 mg/dL (ref <130)
Total CHOL/HDL Ratio: 3 (calc) (ref <5.0)
Triglycerides: 135 mg/dL (ref <150)

## 2024-01-09 LAB — COMPREHENSIVE METABOLIC PANEL WITH GFR
AG Ratio: 1.3 (calc) (ref 1.0–2.5)
ALT: 16 U/L (ref 6–29)
AST: 18 U/L (ref 10–35)
Albumin: 4.7 g/dL (ref 3.6–5.1)
Alkaline phosphatase (APISO): 70 U/L (ref 37–153)
BUN: 9 mg/dL (ref 7–25)
CO2: 30 mmol/L (ref 20–32)
Calcium: 9.5 mg/dL (ref 8.6–10.4)
Chloride: 102 mmol/L (ref 98–110)
Creat: 0.72 mg/dL (ref 0.50–1.03)
Globulin: 3.6 g/dL (ref 1.9–3.7)
Glucose, Bld: 101 mg/dL — ABNORMAL HIGH (ref 65–99)
Potassium: 3.9 mmol/L (ref 3.5–5.3)
Sodium: 139 mmol/L (ref 135–146)
Total Bilirubin: 0.4 mg/dL (ref 0.2–1.2)
Total Protein: 8.3 g/dL — ABNORMAL HIGH (ref 6.1–8.1)
eGFR: 97 mL/min/{1.73_m2} (ref >=60)

## 2024-01-09 LAB — MICROALBUMIN / CREATININE URINE RATIO
Creatinine, Urine: 62 mg/dL (ref 20–275)
Microalb Creat Ratio: 8 mg/g{creat} (ref <30)
Microalb, Ur: 0.5 mg/dL

## 2024-01-11 ENCOUNTER — Other Ambulatory Visit: Payer: Self-pay | Admitting: Family Medicine

## 2024-03-08 ENCOUNTER — Other Ambulatory Visit: Payer: Self-pay | Admitting: Family Medicine

## 2024-03-11 NOTE — Telephone Encounter (Signed)
 Requested Prescriptions  Pending Prescriptions Disp Refills   hydrochlorothiazide  (HYDRODIURIL ) 25 MG tablet [Pharmacy Med Name: HYDROCHLOROTHIAZIDE  25MG  TABLET] 90 tablet 0    Sig: TAKE ONE TABLET BY MOUTH ONCE A DAY     Cardiovascular: Diuretics - Thiazide Passed - 03/11/2024 11:30 AM      Passed - Cr in normal range and within 180 days    Creat  Date Value Ref Range Status  01/08/2024 0.72 0.50 - 1.03 mg/dL Final   Creatinine, Urine  Date Value Ref Range Status  01/08/2024 62 20 - 275 mg/dL Final         Passed - K in normal range and within 180 days    Potassium  Date Value Ref Range Status  01/08/2024 3.9 3.5 - 5.3 mmol/L Final         Passed - Na in normal range and within 180 days    Sodium  Date Value Ref Range Status  01/08/2024 139 135 - 146 mmol/L Final  08/06/2016 143 134 - 144 mmol/L Final         Passed - Last BP in normal range    BP Readings from Last 1 Encounters:  01/08/24 112/62         Passed - Valid encounter within last 6 months    Recent Outpatient Visits           2 months ago Controlled type 2 diabetes mellitus with complication, without long-term current use of insulin (HCC)   [place] [facility] Duanne Butler DASEN, MD   1 year ago Chronic cough   [place] [facility] Duanne Butler DASEN, MD   1 year ago Controlled type 2 diabetes mellitus with complication, without long-term current use of insulin [facility])   [place] [facility] Pickard, Butler DASEN, MD   1 year ago Chronic cough   [place] [facility] Duanne Butler DASEN, MD   1 year ago Acute non-recurrent maxillary sinusitis    [facility] Pickard, Butler DASEN, MD       Future Appointments             In 2 days Leonce Katz, DO [facility]

## 2024-03-12 NOTE — Progress Notes (Unsigned)
    Ben Jackson D.CLEMENTEEN [facility] [address] [place] [address] Phone: [phone]   Assessment and Plan:     There are no diagnoses linked to this encounter.  ***   Pertinent previous records reviewed include ***    Follow Up: ***     Subjective:   I, Jovonni Borquez, am serving as a Neurosurgeon for Doctor Morene Mace  Chief Complaint: neck pain   HPI:   03/13/2024 Patient is a 58 year old female with neck pain. Patient states  Relevant Historical Information: ***  Additional pertinent review of systems negative.   Current Outpatient Medications:    Cholecalciferol (VITAMIN D -1000 MAX ST) 25 MCG (1000 UT) tablet, Take 1,000 Units by mouth daily., Disp: , Rfl:    Cholecalciferol (VITAMIN D3) 25 MCG (1000 UT) CAPS, Take 1 capsule (1,000 Units total) by mouth daily., Disp: 90 capsule, Rfl: 3   doxycycline  (VIBRA -TABS) 100 MG tablet, Take 100 mg by mouth daily., Disp: , Rfl:    hydrochlorothiazide  (HYDRODIURIL ) 25 MG tablet, TAKE ONE TABLET BY MOUTH ONCE A DAY, Disp: 90 tablet, Rfl: 0   montelukast  (SINGULAIR ) 10 MG tablet, TAKE ONE TABLET BY MOUTH EVERY NIGHT AT BEDTIME, Disp: 30 tablet, Rfl: 5   MOUNJARO 5 MG/0.5ML Pen, Inject 5 mg into the skin once a week., Disp: , Rfl:    pantoprazole  (PROTONIX ) 40 MG tablet, TAKE ONE TABLET (40 MG TOTAL) BY MOUTH DAILY., Disp: 90 tablet, Rfl: 2   potassium chloride  SA (KLOR-CON  M) 20 MEQ tablet, TAKE ONE TABLET BY MOUTH ONCE A DAY, Disp: 90 tablet, Rfl: 3   rosuvastatin  (CRESTOR ) 20 MG tablet, TAKE ONE TABLET BY MOUTH ONCE A DAY, Disp: 90 tablet, Rfl: 3   Objective:     There were no vitals filed for this visit.    There is no height or weight on file to calculate BMI.    Physical Exam:    ***   Electronically signed by:  [name] D.[facility] 9:56 AM 03/12/24

## 2024-03-13 ENCOUNTER — Ambulatory Visit (INDEPENDENT_AMBULATORY_CARE_PROVIDER_SITE_OTHER)

## 2024-03-13 ENCOUNTER — Ambulatory Visit: Admitting: Sports Medicine

## 2024-03-13 VITALS — BP 120/82 | HR 102 | Ht 64.0 in | Wt 144.0 lb

## 2024-03-13 DIAGNOSIS — M546 Pain in thoracic spine: Secondary | ICD-10-CM

## 2024-03-13 DIAGNOSIS — M549 Dorsalgia, unspecified: Secondary | ICD-10-CM | POA: Diagnosis not present

## 2024-03-13 DIAGNOSIS — M25512 Pain in left shoulder: Secondary | ICD-10-CM

## 2024-03-13 DIAGNOSIS — M545 Low back pain, unspecified: Secondary | ICD-10-CM

## 2024-03-13 DIAGNOSIS — R200 Anesthesia of skin: Secondary | ICD-10-CM

## 2024-03-13 DIAGNOSIS — M542 Cervicalgia: Secondary | ICD-10-CM

## 2024-03-13 MED ORDER — MELOXICAM 15 MG PO TABS: 15.0000 mg | ORAL_TABLET | Freq: Every day | ORAL | 0 refills | Status: DC

## 2024-03-13 NOTE — Patient Instructions (Signed)
-   Start meloxicam  15 mg daily x2 weeks.  If still having pain after 2 weeks, complete 3rd-week of NSAID. May use remaining NSAID as needed once daily for pain control.  Do not to use additional over-the-counter NSAIDs (ibuprofen , naproxen, Advil , Aleve, etc.) while taking prescription NSAIDs.  May use Tylenol  [phone] mg 2 to 3 times a day for breakthrough pain. Neck shoulder HEP  PT referral  Work note provided  6 week follow up

## 2024-03-18 ENCOUNTER — Ambulatory Visit: Payer: Self-pay | Admitting: Sports Medicine

## 2024-03-22 ENCOUNTER — Ambulatory Visit: Admitting: Sports Medicine

## 2024-03-22 ENCOUNTER — Ambulatory Visit: Admitting: Family Medicine

## 2024-04-19 ENCOUNTER — Ambulatory Visit: Admitting: Sports Medicine

## 2024-05-03 ENCOUNTER — Ambulatory Visit: Admitting: Sports Medicine

## 2024-05-07 ENCOUNTER — Other Ambulatory Visit: Payer: Self-pay | Admitting: Nurse Practitioner

## 2024-05-07 ENCOUNTER — Other Ambulatory Visit: Payer: Self-pay

## 2024-05-07 ENCOUNTER — Ambulatory Visit
Admission: EM | Admit: 2024-05-07 | Discharge: 2024-05-07 | Disposition: A | Discharge status: Home / Self Care | Attending: Family Medicine | Admitting: Family Medicine

## 2024-05-07 DIAGNOSIS — S46912A Strain of unspecified muscle, fascia and tendon at shoulder and upper arm level, left arm, initial encounter: Secondary | ICD-10-CM | POA: Diagnosis not present

## 2024-05-07 DIAGNOSIS — R079 Chest pain, unspecified: Secondary | ICD-10-CM

## 2024-05-07 DIAGNOSIS — E041 Nontoxic single thyroid nodule: Secondary | ICD-10-CM

## 2024-05-07 MED ORDER — TIRZEPATIDE 5 MG/0.5ML ~~LOC~~ SOAJ
5.0000 mg | SUBCUTANEOUS | 1 refills | Status: DC
  Filled 2024-05-07: qty 2, 28d supply, fill #0

## 2024-05-07 MED ORDER — MELOXICAM 15 MG PO TABS: 15.0000 mg | ORAL_TABLET | Freq: Every day | ORAL | 0 refills | Status: AC | PRN

## 2024-05-07 NOTE — ED Triage Notes (Signed)
 Pt reports left shoulder pain from a pulled muscle x 1 month  States she has mid chest pain with it radiating t her back when she breathes x 1 week on and off.  Denies cough

## 2024-05-07 NOTE — ED Provider Notes (Signed)
 [facility]    CSN: [id number] Arrival date & time: 05/07/24  0808      History   Chief Complaint Chief Complaint  Patient presents with   Chest Pain    HPI Stephanie [name] is a 58 y.o. female.   Presenting today with left shoulder pain for the past month.  She was seen by orthopedics for this and was diagnosed with a muscle strain of the shoulder and given meloxicam  which she states did help but she is out of it now.  She is now also having mid chest pain that is worse with deep breathing and movement for the past week.  Denies cough, wheezing, shortness of breath, palpitations, dizziness, abdominal pain, nausea vomiting or diarrhea, known injury or exertional lifting.  Not currently taking anything over-the-counter for symptoms.  Past medical history significant for hypertension, hyperlipidemia, diabetes, GERD.    Past Medical History:  Diagnosis Date   COVID-19 08/19/2019   Symptoms resolved 08/22/19   Diabetes mellitus type 2 in obese    Family history of adverse reaction to anesthesia    son - Malignant Hyperthermia    GERD (gastroesophageal reflux disease)    High cholesterol    Hyperlipidemia    Hypertension    Tubular adenoma of colon    Wears contact lenses     Patient Active Problem List   Diagnosis Date Noted   Vitamin D  deficiency 05/05/2021   Acute rhinosinusitis 05/05/2021   Tubular adenoma of colon    Prediabetes    Unilateral primary osteoarthritis, left knee 05/16/2017   Unilateral primary osteoarthritis, right knee 05/16/2017   Chest pain 11/22/2016   Hyperlipidemia    Hypertension    STRICTURE AND STENOSIS OF ESOPHAGUS 02/24/2009   GERD 02/12/2009   NAUSEA AND VOMITING 02/12/2009   ABDOMINAL PAIN-EPIGASTRIC 02/12/2009    Past Surgical History:  Procedure Laterality Date   CESAREAN SECTION     CHOLECYSTECTOMY     COLONOSCOPY WITH PROPOFOL  N/A 10/21/2019   Procedure: COLONOSCOPY WITH PROPOFOL ;  Surgeon: Therisa Bi, MD;  Location:  [facility] ENDOSCOPY;  Service: Endoscopy;  Laterality: N/A;  Diabetic - oral meds COVID (+) - 08/19/19   NASAL SINUS SURGERY  2017   Dr.Byers [facility]    OB History   No obstetric history on file.      Home Medications    Prior to Admission medications   Medication Sig Start Date End Date Taking? Authorizing Provider  Cholecalciferol (VITAMIN D -1000 MAX ST) 25 MCG (1000 UT) tablet Take 1,000 Units by mouth daily. 01/09/23   [provider]  Cholecalciferol (VITAMIN D3) 25 MCG (1000 UT) CAPS Take 1 capsule (1,000 Units total) by mouth daily. 07/05/23   Duanne Butler DASEN, MD  doxycycline  (VIBRA -TABS) 100 MG tablet Take 100 mg by mouth daily. 01/01/24   [provider]  hydrochlorothiazide  (HYDRODIURIL ) 25 MG tablet TAKE ONE TABLET BY MOUTH ONCE A DAY 03/11/24   Duanne Butler DASEN, MD  meloxicam  (MOBIC ) 15 MG tablet Take 1 tablet (15 mg total) by mouth daily as needed for pain. 05/07/24   Stuart Vernell Norris, PA-C  montelukast  (SINGULAIR ) 10 MG tablet TAKE ONE TABLET BY MOUTH EVERY NIGHT AT BEDTIME 03/17/23   Duanne Butler DASEN, MD  MOUNJARO  5 MG/0.5ML Pen Inject 5 mg into the skin once a week. 04/20/23   [provider]  pantoprazole  (PROTONIX ) 40 MG tablet TAKE ONE TABLET (40 MG TOTAL) BY MOUTH DAILY. 01/11/24   Duanne Butler DASEN, MD  potassium  chloride SA (KLOR-CON  M) 20 MEQ tablet TAKE ONE TABLET BY MOUTH ONCE A DAY 03/28/23   Duanne Butler DASEN, MD  rosuvastatin  (CRESTOR ) 20 MG tablet TAKE ONE TABLET BY MOUTH ONCE A DAY 07/05/23   Duanne Butler DASEN, MD  tirzepatide  (MOUNJARO ) 5 MG/0.5ML Pen Inject 5 mg into the skin once a week. 02/05/24       Family History Family History  Problem Relation Age of Onset   Hypertension Mother    Hyperlipidemia Mother    Heart disease Father    Hyperlipidemia Brother    Hypertension Brother    Cancer Maternal Grandmother        brain tumor   Cancer Maternal Grandfather        retinoblastoma    Social History Social History    Tobacco Use   Smoking status: Never   Smokeless tobacco: Never  Vaping Use   Vaping status: Never Used  Substance Use Topics   Alcohol use: No   Drug use: Never     Allergies   Patient has no known allergies.   Review of Systems Review of Systems Per HPI  Physical Exam Triage Vital Signs ED Triage Vitals [05/07/24 0820]  Encounter Vitals Group     BP (!) 152/81     Girls Systolic BP Percentile      Girls Diastolic BP Percentile      Boys Systolic BP Percentile      Boys Diastolic BP Percentile      Pulse Rate 74     Resp 20     Temp 97.6 F (36.4 C)     Temp Source Oral     SpO2 95 %     Weight      Height      Head Circumference      Peak Flow      Pain Score 3     Pain Loc      Pain Education      Exclude from Growth Chart    No data found.  Updated Vital Signs BP (!) 142/80 (BP Location: Right Arm)   Pulse 74   Temp 97.6 F (36.4 C) (Oral)   Resp 20   SpO2 95%   Visual Acuity Right Eye Distance:   Left Eye Distance:   Bilateral Distance:    Right Eye Near:   Left Eye Near:    Bilateral Near:     Physical Exam Vitals and nursing note reviewed.  Constitutional:      Appearance: Normal appearance. She is not ill-appearing.  HENT:     Head: Atraumatic.     Mouth/Throat:     Mouth: Mucous membranes are moist.  Eyes:     Extraocular Movements: Extraocular movements intact.     Conjunctiva/sclera: Conjunctivae normal.  Cardiovascular:     Rate and Rhythm: Normal rate and regular rhythm.     Heart sounds: Normal heart sounds.  Pulmonary:     Effort: Pulmonary effort is normal. No respiratory distress.     Breath sounds: Normal breath sounds. No wheezing or rales.  Musculoskeletal:        General: Normal range of motion.     Cervical back: Normal range of motion and neck supple.     Comments: Tender to palpation over sternal region, pain reproduced on palpation of this area as well as range of motion against resistance of the upper  extremities bilaterally  Skin:    General: Skin is warm and dry.  Neurological:  Mental Status: She is alert and oriented to person, place, and time.  Psychiatric:        Mood and Affect: Mood normal.        Thought Content: Thought content normal.        Judgment: Judgment normal.      UC Treatments / Results  Labs (all labs ordered are listed, but only abnormal results are displayed) Labs Reviewed - No data to display  EKG   Radiology No results found.  Procedures Procedures (including critical care time)  Medications Ordered in UC Medications - No data to display  Initial Impression / Assessment and Plan / UC Course  I have reviewed the triage vital signs and the nursing notes.  Pertinent labs & imaging results that were available during my care of the patient were reviewed by me and considered in my medical decision making (see chart for details).     Very minimally hypertensive in triage, otherwise vital signs within normal limits.  She is very well-appearing today and in no acute distress.  EKG today showing normal sinus rhythm at 71 bpm without acute ST or T wave changes.  Exam shows reproducible pain with palpation as well as range of motion against resistance.  Highly suspicious for musculoskeletal pain, possibly costochondritis.  Will refill meloxicam  discussed heat, massage, stretches, rest and return precautions.  Offered chest x-ray, she declines this or further evaluation at this time and wishes to try conservative measures.  Work note given.  Return for worsening symptoms.  Final Clinical Impressions(s) / UC Diagnoses   Final diagnoses:  Chest pain, unspecified type  Strain of left shoulder, initial encounter     Discharge Instructions      I suspect your chest pain to be muscular in nature.  I have refilled the meloxicam  which is an anti-inflammatory pain medication and you may apply heat, topical muscle rubs and do massage, gentle stretches and  range of motion exercises.  Follow-up for worsening or unresolving symptoms.  Your exam, vital signs and EKG are reassuring today.    ED Prescriptions     Medication Sig Dispense Auth. Provider   meloxicam  (MOBIC ) 15 MG tablet Take 1 tablet (15 mg total) by mouth daily as needed for pain. 30 tablet Stuart Vernell Norris, [place]      PDMP not reviewed this encounter.   Stuart Vernell Norris, [place] 05/07/24 1952

## 2024-05-07 NOTE — Discharge Instructions (Signed)
 I suspect your chest pain to be muscular in nature.  I have refilled the meloxicam  which is an anti-inflammatory pain medication and you may apply heat, topical muscle rubs and do massage, gentle stretches and range of motion exercises.  Follow-up for worsening or unresolving symptoms.  Your exam, vital signs and EKG are reassuring today.

## 2024-05-14 ENCOUNTER — Other Ambulatory Visit: Payer: Self-pay | Admitting: Family Medicine

## 2024-05-17 ENCOUNTER — Other Ambulatory Visit

## 2024-05-17 ENCOUNTER — Ambulatory Visit
Admission: RE | Admit: 2024-05-17 | Discharge: 2024-05-17 | Disposition: A | Discharge status: Home / Self Care | Source: Ambulatory Visit | Attending: Nurse Practitioner | Admitting: Nurse Practitioner

## 2024-05-17 DIAGNOSIS — E041 Nontoxic single thyroid nodule: Secondary | ICD-10-CM

## 2024-05-24 ENCOUNTER — Other Ambulatory Visit: Payer: Self-pay

## 2024-05-24 MED ORDER — MOUNJARO 5 MG/0.5ML ~~LOC~~ SOAJ
5.0000 mg | SUBCUTANEOUS | 1 refills | Status: DC
  Filled 2024-05-27 – 2024-05-30 (×4): qty 2, 28d supply, fill #0
  Filled 2024-06-28: qty 2, 28d supply, fill #1

## 2024-05-27 ENCOUNTER — Other Ambulatory Visit: Payer: Self-pay

## 2024-05-28 ENCOUNTER — Other Ambulatory Visit: Payer: Self-pay

## 2024-05-28 ENCOUNTER — Other Ambulatory Visit (HOSPITAL_BASED_OUTPATIENT_CLINIC_OR_DEPARTMENT_OTHER): Payer: Self-pay

## 2024-05-29 ENCOUNTER — Other Ambulatory Visit: Payer: Self-pay

## 2024-05-30 ENCOUNTER — Other Ambulatory Visit: Payer: Self-pay

## 2024-05-31 ENCOUNTER — Other Ambulatory Visit: Payer: Self-pay

## 2024-06-28 ENCOUNTER — Other Ambulatory Visit: Payer: Self-pay

## 2024-07-01 ENCOUNTER — Other Ambulatory Visit: Payer: Self-pay

## 2024-07-10 ENCOUNTER — Other Ambulatory Visit: Payer: Self-pay

## 2024-07-23 ENCOUNTER — Other Ambulatory Visit: Payer: Self-pay

## 2024-07-24 ENCOUNTER — Other Ambulatory Visit: Payer: Self-pay

## 2024-07-24 ENCOUNTER — Other Ambulatory Visit: Payer: Self-pay | Admitting: Family Medicine

## 2024-07-24 MED ORDER — MOUNJARO 5 MG/0.5ML ~~LOC~~ SOAJ
5.0000 mg | SUBCUTANEOUS | 1 refills | Status: DC
  Filled 2024-07-24: qty 2, 28d supply, fill #0
  Filled 2024-08-21 – 2024-08-23 (×2): qty 2, 28d supply, fill #1

## 2024-07-26 ENCOUNTER — Other Ambulatory Visit: Payer: Self-pay

## 2024-07-26 NOTE — Telephone Encounter (Signed)
 Requested Prescriptions  Refused Prescriptions Disp Refills   pantoprazole  (PROTONIX ) 40 MG tablet [Pharmacy Med Name: PANTOPRAZOLE  SODIUM 40MG  TABLET DR] 90 tablet 2    Sig: TAKE ONE TABLET (40 MG TOTAL) BY MOUTH DAILY.     Gastroenterology: Proton Pump Inhibitors Passed - 07/26/2024  3:21 PM      Passed - Valid encounter within last 12 months    Recent Outpatient Visits           6 months ago Controlled type 2 diabetes mellitus with complication, without long-term current use of insulin [facility])   [facility] Butler DASEN, MD   1 year ago Chronic cough   [place] [facility] Duanne Butler DASEN, MD   1 year ago Controlled type 2 diabetes mellitus with complication, without long-term current use of insulin [facility])   [place] [facility] Duanne Butler DASEN, MD   1 year ago Chronic cough   [place] [facility] Duanne Butler DASEN, MD   1 year ago Acute non-recurrent maxillary sinusitis    [facility] Pickard, Butler DASEN, MD

## 2024-08-02 ENCOUNTER — Other Ambulatory Visit: Payer: Self-pay

## 2024-08-05 ENCOUNTER — Other Ambulatory Visit: Payer: Self-pay | Admitting: Obstetrics and Gynecology

## 2024-08-05 DIAGNOSIS — Z1231 Encounter for screening mammogram for malignant neoplasm of breast: Secondary | ICD-10-CM

## 2024-08-13 ENCOUNTER — Other Ambulatory Visit: Payer: Self-pay | Admitting: Family Medicine

## 2024-08-21 ENCOUNTER — Other Ambulatory Visit: Payer: Self-pay

## 2024-08-26 ENCOUNTER — Other Ambulatory Visit: Payer: Self-pay

## 2024-08-30 NOTE — Progress Notes (Unsigned)
 "               Stephanie [name] D.CLEMENTEEN [name] [address] [place] [address] Phone: [phone]   Assessment and Plan:     1. Neck pain (Primary) 2. Left arm numbness 3. Chronic bilateral thoracic back pain 4. Chronic bilateral low back pain without sciatica 5. Chronic left shoulder pain - Chronic with exacerbation, subsequent visit - Continued neck pain with radicular symptoms into left upper extremity, left shoulder.  Still most consistent with degenerative changes in C-spine primarily at C4-5, resulting in musculoskeletal dysfunction, neck pain, with additional likely cervical radiculopathy.  Suspect secondary shoulder condition is contributing to pain, most likely left subacromial bursitis versus left rotator cuff tendinopathy - Patient felt no benefit with meloxicam  course.  May discontinue meloxicam  - Start prednisone  Dosepak - Recommend Flexeril  5 to 10 mg nightly as needed for muscle spasms - Patient's symptoms would not allow patient to sleep last night due to pain.  Recommend patient take the rest the day off due to severity of pain, current fatigue level.  Work note provided - Continue HEP.  Patient was not able to start physical therapy - Recommend MRI of C-spine due to failure to improve despite >6 weeks of conservative therapy, pain with day-to-day activities, pain >6/10   Pertinent previous records reviewed include none   Follow Up: 1 week after MRI to review results and discuss treatment plan.  Could consider epidural CSI versus subacromial CSI.   Subjective:   I, Stephanie [name], am serving as a neurosurgeon for Doctor Morene [name]   Chief Complaint: neck pain    HPI:    03/13/2024 Patient is a 59 year old female with neck pain. Patient states neck pain for about 5 weeks. She isnt able to sleep through the night. Left shoulder numbness and tingling. No MOI. Tylenol  and ibu for the pain and that doesn't help. Decreased ROM. She would like a  note for work.   09/06/2024 Patient states she hasn't been able to start PT. Pain has radiates up the head and down to the thoracic back. She is not able to sleep through the night.    Relevant Historical Information: Hypertension, prediabetes    Additional pertinent review of systems negative.  Current Medications[1]   Objective:     Vitals:   09/02/24 1437  BP: 122/84  Pulse: 94  SpO2: 99%  Weight: 148 lb (67.1 kg)  Height: 5' 4 (1.626 m)      Body mass index is 25.4 kg/m.    Physical Exam:    Neck Exam: Cervical Spine- Posture normal Skin- normal, intact   Neuro:  Strength-   Right Left  Deltoid (C5) 5/5 5/5 Bicep/Brachioradialis (C5/6) 5/5  5/5 Wrist Extension (C6) 5/5 5/5 Tricep (C7) 5/5 5/5 Wrist Flexion (C7) 5/5 5/5 Grip (C8) 5/5 5/5 Finger Abduction (T1) 5/5 5/5   Sensation: intact to light touch in upper extremities bilaterally   Spurling's:  negative bilaterally Neck ROM: Decreased sidebending, flexion, extension and left rotation active ROM  TTP: cervical spinous processes, cervical paraspinal, thoracic paraspinal, trapezius    Left shoulder: ROM flexion 100, abduction 100, internal rotation 15    Electronically signed by:  Stephanie [name] D.CLEMENTEEN [name] 3:39 PM 09/02/2024     [1]  Current Outpatient Medications:    cyclobenzaprine  (FLEXERIL ) 5 MG tablet, Take 1 tablet (5 mg total) by mouth at bedtime., Disp: 30 tablet, Rfl: 0   methylPREDNISolone  (  MEDROL  DOSEPAK) 4 MG TBPK tablet, Take 6 tablets on day 1.  Take 5 tablets on day 2.  Take 4 tablets on day 3.  Take 3 tablets on day 4.  Take 2 tablets on day 5.  Take 1 tablet on day 6., Disp: 21 tablet, Rfl: 0   Cholecalciferol (VITAMIN D -1000 MAX ST) 25 MCG (1000 UT) tablet, Take 1,000 Units by mouth daily., Disp: , Rfl:    Cholecalciferol (VITAMIN D3) 25 MCG (1000 UT) CAPS, Take 1 capsule (1,000 Units total) by mouth daily., Disp: 90 capsule, Rfl: 3   doxycycline  (VIBRA -TABS)  100 MG tablet, Take 100 mg by mouth daily., Disp: , Rfl:    hydrochlorothiazide  (HYDRODIURIL ) 25 MG tablet, TAKE ONE TABLET BY MOUTH ONCE A DAY, Disp: 90 tablet, Rfl: 0   meloxicam  (MOBIC ) 15 MG tablet, Take 1 tablet (15 mg total) by mouth daily as needed for pain., Disp: 30 tablet, Rfl: 0   montelukast  (SINGULAIR ) 10 MG tablet, TAKE ONE TABLET BY MOUTH EVERY NIGHT AT BEDTIME, Disp: 30 tablet, Rfl: 5   MOUNJARO  5 MG/0.5ML Pen, Inject 5 mg into the skin once a week., Disp: , Rfl:    pantoprazole  (PROTONIX ) 40 MG tablet, TAKE ONE TABLET (40 MG TOTAL) BY MOUTH DAILY., Disp: 90 tablet, Rfl: 2   potassium chloride  SA (KLOR-CON  M) 20 MEQ tablet, TAKE ONE TABLET BY MOUTH ONCE A DAY, Disp: 90 tablet, Rfl: 3   rosuvastatin  (CRESTOR ) 20 MG tablet, TAKE ONE TABLET BY MOUTH ONCE A DAY, Disp: 90 tablet, Rfl: 3   tirzepatide  (MOUNJARO ) 5 MG/0.5ML Pen, Inject 5 mg into the skin once a week., Disp: 2 mL, Rfl: 1  "

## 2024-09-02 ENCOUNTER — Ambulatory Visit: Admitting: Sports Medicine

## 2024-09-02 VITALS — BP 122/84 | HR 94 | Ht 64.0 in | Wt 148.0 lb

## 2024-09-02 DIAGNOSIS — M545 Low back pain, unspecified: Secondary | ICD-10-CM

## 2024-09-02 DIAGNOSIS — R200 Anesthesia of skin: Secondary | ICD-10-CM | POA: Diagnosis not present

## 2024-09-02 DIAGNOSIS — M25512 Pain in left shoulder: Secondary | ICD-10-CM | POA: Diagnosis not present

## 2024-09-02 DIAGNOSIS — M542 Cervicalgia: Secondary | ICD-10-CM

## 2024-09-02 DIAGNOSIS — G8929 Other chronic pain: Secondary | ICD-10-CM

## 2024-09-02 DIAGNOSIS — M546 Pain in thoracic spine: Secondary | ICD-10-CM | POA: Diagnosis not present

## 2024-09-02 MED ORDER — CYCLOBENZAPRINE HCL 5 MG PO TABS: 5.0000 mg | ORAL_TABLET | Freq: Every day | ORAL | 0 refills | Status: AC

## 2024-09-02 MED ORDER — METHYLPREDNISOLONE 4 MG PO TBPK: ORAL_TABLET | ORAL | 0 refills | Status: AC

## 2024-09-02 NOTE — Patient Instructions (Signed)
 Prednisone  dos pak   Flexeril  5-10 mg nightly as needed for muscle spasm  Work note provided   MRI c and t spine  Follow up 1 week after MRI to discuss results

## 2024-09-04 ENCOUNTER — Telehealth: Payer: Self-pay | Admitting: Sports Medicine

## 2024-09-04 NOTE — Telephone Encounter (Signed)
 [employer] BROCK[employer] with [employer] called regarding Dr. Leonce putting in a request for and MRI thoracic. It has been denied but Dr. Leonce has up to a year to da a peer to peer or submit additional clinical . [phone]  Ref#  for case [id number]

## 2024-09-04 NOTE — Telephone Encounter (Signed)
 Looking into. Waiting for the approval or denial of the cervical spine. I need to appeal the decision as insurance is wanting proof of 12 weeks of provider directed treatment.

## 2024-09-04 NOTE — Telephone Encounter (Signed)
 Both MRI's Denied. Appeal letter written and faxed to [facility] appeals at [phone]. Awaiting reply from insurance.

## 2024-09-05 NOTE — Telephone Encounter (Signed)
 I received an approval through [employer] on the cervical spine. Still waiting for the thoracic spine.  Status: APPROVED, Procedure Code: 27858, Authorization Number: [id number], Start Date: 2024-09-03, End Date: 2025-03-02, Reference Number: [id number], ------

## 2024-09-08 ENCOUNTER — Ambulatory Visit
Admission: RE | Admit: 2024-09-08 | Discharge: 2024-09-08 | Disposition: A | Discharge status: Home / Self Care | Source: Ambulatory Visit | Attending: Sports Medicine | Admitting: Sports Medicine

## 2024-09-08 ENCOUNTER — Other Ambulatory Visit

## 2024-09-08 DIAGNOSIS — G8929 Other chronic pain: Secondary | ICD-10-CM

## 2024-09-08 DIAGNOSIS — R200 Anesthesia of skin: Secondary | ICD-10-CM

## 2024-09-08 DIAGNOSIS — M542 Cervicalgia: Secondary | ICD-10-CM

## 2024-09-10 ENCOUNTER — Ambulatory Visit: Payer: Self-pay | Admitting: Sports Medicine

## 2024-09-12 ENCOUNTER — Ambulatory Visit: Admitting: Sports Medicine

## 2024-09-12 VITALS — BP 128/84 | HR 84 | Ht 64.0 in | Wt 147.0 lb

## 2024-09-12 DIAGNOSIS — M4802 Spinal stenosis, cervical region: Secondary | ICD-10-CM

## 2024-09-12 DIAGNOSIS — M546 Pain in thoracic spine: Secondary | ICD-10-CM | POA: Diagnosis not present

## 2024-09-12 DIAGNOSIS — R200 Anesthesia of skin: Secondary | ICD-10-CM

## 2024-09-12 DIAGNOSIS — M25512 Pain in left shoulder: Secondary | ICD-10-CM

## 2024-09-12 DIAGNOSIS — G8929 Other chronic pain: Secondary | ICD-10-CM

## 2024-09-12 DIAGNOSIS — M542 Cervicalgia: Secondary | ICD-10-CM | POA: Diagnosis not present

## 2024-09-12 MED ORDER — TIZANIDINE HCL 4 MG PO TABS: 4.0000 mg | ORAL_TABLET | Freq: Every evening | ORAL | 1 refills | Status: AC | PRN

## 2024-09-12 NOTE — Progress Notes (Signed)
 "               [name] D.CLEMENTEEN [facility] [address] [place] [address] Phone: [phone]   Assessment and Plan:     1. Spinal stenosis in cervical region (Primary) 2. Neck pain 3. Left arm numbness 4. Chronic left shoulder pain 5. Chronic bilateral thoracic back pain -Chronic with exacerbation, subsequent visit - Continued neck pain with radicular symptoms into left upper extremity, left shoulder.  Most consistent with degenerative changes, stenosis seen at C4-C5 - Reviewed MRI with patient in clinic.  Discussed multiple degenerative changes most advanced at C4-C5 resulting in moderate stenosis.  Less significant degenerative changes also present at C5-C6 and C6-C7 - Offered epidural CSI, however discussed epidural limitations such as we cannot inject above C6-C7 level with patient's primary pathology being at C4-C5 level.  Because of this, I have limited confidence that epidural would be beneficial.  Patient elected to not proceed with epidural at this time - Based on no significant improvement despite conservative therapy including prednisone  Dosepak, meloxicam  course, HEP, muscle relaxers, patient being unable to start physical therapy, recommend further evaluation with neurosurgery.  Referral sent - Discontinue Flexeril  due to next-day fatigue - May use tizanidine  4-8 mg nightly as needed for muscle spasms - Use Tylenol  500 to 1000 mg tablets 2-3 times a day for day-to-day pain relief  - Recommend ergonomic headset and chair for work to decrease chronic flares.  Note provided   Pertinent previous records reviewed include C-spine MRI 09/08/2024 IMPRESSION: 1. Degenerative disc osteophyte complex at C4-5 with resultant moderate spinal stenosis, with moderate left and mild right C5 foraminal narrowing. 2. Degenerative disc osteophyte complex at C5-6 with resultant moderate spinal stenosis, with moderate right worse than left C6 foraminal narrowing. 3.  Mild disc bulge with uncovertebral spurring at C6-7 with resultant mild left C7 foraminal stenosis.   Follow Up: As needed   Subjective:   I, Chestine Reeves, am serving as a neurosurgeon for Doctor Morene Mace   Chief Complaint: neck pain    HPI:    03/13/2024 Patient is a 59 year old female with neck pain. Patient states neck pain for about 5 weeks. She isnt able to sleep through the night. Left shoulder numbness and tingling. No MOI. Tylenol  and ibu for the pain and that doesn't help. Decreased ROM. She would like a note for work.    09/06/2024 Patient states she hasn't been able to start PT. Pain has radiates up the head and down to the thoracic back. She is not able to sleep through the night.   09/12/2024 Patient states she is the same    Relevant Historical Information: Hypertension, prediabetes  Additional pertinent review of systems negative.  Current Medications[1]   Objective:     Vitals:   09/12/24 0907  BP: 128/84  Pulse: 84  Weight: 147 lb (66.7 kg)  Height: 5' 4 (1.626 m)      Body mass index is 25.23 kg/m.    Physical Exam:    Neck Exam: Cervical Spine- Posture normal Skin- normal, intact   Neuro:  Strength-   Right Left  Deltoid (C5) 5/5 5/5 Bicep/Brachioradialis (C5/6) 5/5  5/5 Wrist Extension (C6) 5/5 5/5 Tricep (C7) 5/5 5/5 Wrist Flexion (C7) 5/5 5/5 Grip (C8) 5/5 5/5 Finger Abduction (T1) 5/5 5/5   Sensation: intact to light touch in upper extremities bilaterally   Spurling's:  negative bilaterally Neck ROM: Decreased sidebending, flexion, extension and left  rotation active ROM  TTP: cervical spinous processes, cervical paraspinal, thoracic paraspinal, trapezius    Left shoulder: ROM flexion 100, abduction 100, internal rotation 15    Electronically signed by:  [name] D.CLEMENTEEN [facility] 10:08 AM 09/12/24     [1]  Current Outpatient Medications:    tiZANidine  (ZANAFLEX ) 4 MG tablet, Take 1 tablet (4 mg  total) by mouth at bedtime as needed., Disp: 30 tablet, Rfl: 1   Cholecalciferol (VITAMIN D -1000 MAX ST) 25 MCG (1000 UT) tablet, Take 1,000 Units by mouth daily., Disp: , Rfl:    Cholecalciferol (VITAMIN D3) 25 MCG (1000 UT) CAPS, Take 1 capsule (1,000 Units total) by mouth daily., Disp: 90 capsule, Rfl: 3   cyclobenzaprine  (FLEXERIL ) 5 MG tablet, Take 1 tablet (5 mg total) by mouth at bedtime., Disp: 30 tablet, Rfl: 0   doxycycline  (VIBRA -TABS) 100 MG tablet, Take 100 mg by mouth daily., Disp: , Rfl:    hydrochlorothiazide  (HYDRODIURIL ) 25 MG tablet, TAKE ONE TABLET BY MOUTH ONCE A DAY, Disp: 90 tablet, Rfl: 0   meloxicam  (MOBIC ) 15 MG tablet, Take 1 tablet (15 mg total) by mouth daily as needed for pain., Disp: 30 tablet, Rfl: 0   methylPREDNISolone  (MEDROL  DOSEPAK) 4 MG TBPK tablet, Take 6 tablets on day 1.  Take 5 tablets on day 2.  Take 4 tablets on day 3.  Take 3 tablets on day 4.  Take 2 tablets on day 5.  Take 1 tablet on day 6., Disp: 21 tablet, Rfl: 0   montelukast  (SINGULAIR ) 10 MG tablet, TAKE ONE TABLET BY MOUTH EVERY NIGHT AT BEDTIME, Disp: 30 tablet, Rfl: 5   MOUNJARO  5 MG/0.5ML Pen, Inject 5 mg into the skin once a week., Disp: , Rfl:    pantoprazole  (PROTONIX ) 40 MG tablet, TAKE ONE TABLET (40 MG TOTAL) BY MOUTH DAILY., Disp: 90 tablet, Rfl: 2   potassium chloride  SA (KLOR-CON  M) 20 MEQ tablet, TAKE ONE TABLET BY MOUTH ONCE A DAY, Disp: 90 tablet, Rfl: 3   rosuvastatin  (CRESTOR ) 20 MG tablet, TAKE ONE TABLET BY MOUTH ONCE A DAY, Disp: 90 tablet, Rfl: 3   tirzepatide  (MOUNJARO ) 5 MG/0.5ML Pen, Inject 5 mg into the skin once a week., Disp: 2 mL, Rfl: 1  "

## 2024-09-12 NOTE — Patient Instructions (Signed)
 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

## 2024-09-16 ENCOUNTER — Ambulatory Visit: Admitting: Sports Medicine

## 2024-09-19 ENCOUNTER — Other Ambulatory Visit: Payer: Self-pay

## 2024-09-19 MED ORDER — MOUNJARO 5 MG/0.5ML ~~LOC~~ SOAJ
5.0000 mg | SUBCUTANEOUS | 3 refills | Status: AC
  Filled 2024-09-24: qty 2, 28d supply, fill #0

## 2024-09-20 NOTE — Telephone Encounter (Signed)
 Patient scheduled.

## 2024-09-24 ENCOUNTER — Other Ambulatory Visit: Payer: Self-pay

## 2024-10-25 ENCOUNTER — Ambulatory Visit

## 2024-12-02 ENCOUNTER — Ambulatory Visit: Admitting: Orthopedic Surgery

## 2025-01-09 ENCOUNTER — Encounter: Admitting: Family Medicine
